# Patient Record
Sex: Male | Born: 1935 | Race: White | Hispanic: No | Marital: Married | Smoking: Former smoker
Health system: Southern US, Community
[De-identification: ages and names within clinical notes are randomized; demographics above are authoritative.]

## PROBLEM LIST (undated history)

## (undated) DIAGNOSIS — I4891 Unspecified atrial fibrillation: Secondary | ICD-10-CM

## (undated) DIAGNOSIS — I499 Cardiac arrhythmia, unspecified: Secondary | ICD-10-CM

## (undated) DIAGNOSIS — G309 Alzheimer's disease, unspecified: Secondary | ICD-10-CM

## (undated) DIAGNOSIS — F0280 Dementia in other diseases classified elsewhere without behavioral disturbance: Secondary | ICD-10-CM

## (undated) DIAGNOSIS — I219 Acute myocardial infarction, unspecified: Secondary | ICD-10-CM

## (undated) DIAGNOSIS — I2510 Atherosclerotic heart disease of native coronary artery without angina pectoris: Secondary | ICD-10-CM

## (undated) DIAGNOSIS — I6200 Nontraumatic subdural hemorrhage, unspecified: Secondary | ICD-10-CM

## (undated) DIAGNOSIS — I10 Essential (primary) hypertension: Secondary | ICD-10-CM

## (undated) HISTORY — DX: Cardiac arrhythmia, unspecified: I49.9

## (undated) HISTORY — DX: Atherosclerotic heart disease of native coronary artery without angina pectoris: I25.10

## (undated) HISTORY — DX: Acute myocardial infarction, unspecified: I21.9

## (undated) HISTORY — PX: CORONARY ANGIOPLASTY: SHX604

## (undated) HISTORY — DX: Essential (primary) hypertension: I10

---

## 1991-12-12 DIAGNOSIS — I219 Acute myocardial infarction, unspecified: Secondary | ICD-10-CM

## 1991-12-12 HISTORY — DX: Acute myocardial infarction, unspecified: I21.9

## 2000-05-08 ENCOUNTER — Encounter: Admission: RE | Admit: 2000-05-08 | Discharge: 2000-05-08 | Payer: Self-pay | Admitting: Internal Medicine

## 2000-05-08 ENCOUNTER — Encounter: Payer: Self-pay | Admitting: Internal Medicine

## 2002-12-17 ENCOUNTER — Ambulatory Visit (HOSPITAL_COMMUNITY): Admission: RE | Admit: 2002-12-17 | Discharge: 2002-12-17 | Payer: Self-pay | Admitting: Gastroenterology

## 2005-09-30 ENCOUNTER — Emergency Department (HOSPITAL_COMMUNITY): Admission: EM | Admit: 2005-09-30 | Discharge: 2005-09-30 | Payer: Self-pay | Admitting: Emergency Medicine

## 2006-01-22 ENCOUNTER — Emergency Department (HOSPITAL_COMMUNITY): Admission: EM | Admit: 2006-01-22 | Discharge: 2006-01-22 | Payer: Self-pay | Admitting: Emergency Medicine

## 2007-12-12 DIAGNOSIS — I499 Cardiac arrhythmia, unspecified: Secondary | ICD-10-CM

## 2007-12-12 HISTORY — DX: Cardiac arrhythmia, unspecified: I49.9

## 2008-08-28 DIAGNOSIS — I10 Essential (primary) hypertension: Secondary | ICD-10-CM

## 2008-08-28 HISTORY — DX: Essential (primary) hypertension: I10

## 2011-12-21 DIAGNOSIS — Z961 Presence of intraocular lens: Secondary | ICD-10-CM | POA: Diagnosis not present

## 2011-12-21 DIAGNOSIS — H0230 Blepharochalasis unspecified eye, unspecified eyelid: Secondary | ICD-10-CM | POA: Diagnosis not present

## 2011-12-21 DIAGNOSIS — H26499 Other secondary cataract, unspecified eye: Secondary | ICD-10-CM | POA: Diagnosis not present

## 2012-01-12 DIAGNOSIS — I2510 Atherosclerotic heart disease of native coronary artery without angina pectoris: Secondary | ICD-10-CM | POA: Diagnosis not present

## 2012-01-12 DIAGNOSIS — E782 Mixed hyperlipidemia: Secondary | ICD-10-CM | POA: Diagnosis not present

## 2012-02-09 DIAGNOSIS — C787 Secondary malignant neoplasm of liver and intrahepatic bile duct: Secondary | ICD-10-CM | POA: Diagnosis not present

## 2012-02-09 DIAGNOSIS — C259 Malignant neoplasm of pancreas, unspecified: Secondary | ICD-10-CM | POA: Diagnosis not present

## 2012-06-04 DIAGNOSIS — E785 Hyperlipidemia, unspecified: Secondary | ICD-10-CM | POA: Diagnosis not present

## 2012-06-04 DIAGNOSIS — N400 Enlarged prostate without lower urinary tract symptoms: Secondary | ICD-10-CM | POA: Diagnosis not present

## 2012-06-04 DIAGNOSIS — E871 Hypo-osmolality and hyponatremia: Secondary | ICD-10-CM | POA: Diagnosis not present

## 2012-06-04 DIAGNOSIS — I10 Essential (primary) hypertension: Secondary | ICD-10-CM | POA: Diagnosis not present

## 2012-06-10 DIAGNOSIS — I10 Essential (primary) hypertension: Secondary | ICD-10-CM | POA: Diagnosis not present

## 2012-06-10 DIAGNOSIS — E871 Hypo-osmolality and hyponatremia: Secondary | ICD-10-CM | POA: Diagnosis not present

## 2012-06-10 DIAGNOSIS — N400 Enlarged prostate without lower urinary tract symptoms: Secondary | ICD-10-CM | POA: Diagnosis not present

## 2012-06-10 DIAGNOSIS — E785 Hyperlipidemia, unspecified: Secondary | ICD-10-CM | POA: Diagnosis not present

## 2012-08-21 DIAGNOSIS — L821 Other seborrheic keratosis: Secondary | ICD-10-CM | POA: Diagnosis not present

## 2012-08-21 DIAGNOSIS — L820 Inflamed seborrheic keratosis: Secondary | ICD-10-CM | POA: Diagnosis not present

## 2012-08-21 DIAGNOSIS — D485 Neoplasm of uncertain behavior of skin: Secondary | ICD-10-CM | POA: Diagnosis not present

## 2012-08-21 DIAGNOSIS — Z23 Encounter for immunization: Secondary | ICD-10-CM | POA: Diagnosis not present

## 2012-08-21 DIAGNOSIS — L819 Disorder of pigmentation, unspecified: Secondary | ICD-10-CM | POA: Diagnosis not present

## 2012-08-22 DIAGNOSIS — I10 Essential (primary) hypertension: Secondary | ICD-10-CM | POA: Diagnosis not present

## 2012-08-22 DIAGNOSIS — E782 Mixed hyperlipidemia: Secondary | ICD-10-CM | POA: Diagnosis not present

## 2012-08-22 DIAGNOSIS — I2510 Atherosclerotic heart disease of native coronary artery without angina pectoris: Secondary | ICD-10-CM | POA: Diagnosis not present

## 2012-09-06 DIAGNOSIS — I119 Hypertensive heart disease without heart failure: Secondary | ICD-10-CM | POA: Diagnosis not present

## 2012-09-06 DIAGNOSIS — I2510 Atherosclerotic heart disease of native coronary artery without angina pectoris: Secondary | ICD-10-CM | POA: Diagnosis not present

## 2012-09-06 DIAGNOSIS — E782 Mixed hyperlipidemia: Secondary | ICD-10-CM | POA: Diagnosis not present

## 2012-09-11 DIAGNOSIS — R5383 Other fatigue: Secondary | ICD-10-CM | POA: Diagnosis not present

## 2012-09-11 DIAGNOSIS — R5381 Other malaise: Secondary | ICD-10-CM | POA: Diagnosis not present

## 2012-09-11 DIAGNOSIS — Z79899 Other long term (current) drug therapy: Secondary | ICD-10-CM | POA: Diagnosis not present

## 2012-09-11 DIAGNOSIS — E782 Mixed hyperlipidemia: Secondary | ICD-10-CM | POA: Diagnosis not present

## 2013-01-31 ENCOUNTER — Encounter: Payer: Self-pay | Admitting: *Deleted

## 2013-02-13 DIAGNOSIS — Z79899 Other long term (current) drug therapy: Secondary | ICD-10-CM | POA: Diagnosis not present

## 2013-02-13 DIAGNOSIS — E782 Mixed hyperlipidemia: Secondary | ICD-10-CM | POA: Diagnosis not present

## 2013-02-13 DIAGNOSIS — I2510 Atherosclerotic heart disease of native coronary artery without angina pectoris: Secondary | ICD-10-CM | POA: Diagnosis not present

## 2013-02-19 DIAGNOSIS — I119 Hypertensive heart disease without heart failure: Secondary | ICD-10-CM | POA: Diagnosis not present

## 2013-02-19 DIAGNOSIS — I2510 Atherosclerotic heart disease of native coronary artery without angina pectoris: Secondary | ICD-10-CM | POA: Diagnosis not present

## 2013-02-19 DIAGNOSIS — E782 Mixed hyperlipidemia: Secondary | ICD-10-CM | POA: Diagnosis not present

## 2013-05-19 ENCOUNTER — Other Ambulatory Visit: Payer: Self-pay | Admitting: Cardiovascular Disease

## 2013-06-09 DIAGNOSIS — Z0000 Encounter for general adult medical examination without abnormal findings: Secondary | ICD-10-CM | POA: Diagnosis not present

## 2013-06-09 DIAGNOSIS — I10 Essential (primary) hypertension: Secondary | ICD-10-CM | POA: Diagnosis not present

## 2013-06-09 DIAGNOSIS — N400 Enlarged prostate without lower urinary tract symptoms: Secondary | ICD-10-CM | POA: Diagnosis not present

## 2013-06-09 DIAGNOSIS — E785 Hyperlipidemia, unspecified: Secondary | ICD-10-CM | POA: Diagnosis not present

## 2013-06-09 DIAGNOSIS — Z1331 Encounter for screening for depression: Secondary | ICD-10-CM | POA: Diagnosis not present

## 2013-08-06 DIAGNOSIS — L259 Unspecified contact dermatitis, unspecified cause: Secondary | ICD-10-CM | POA: Diagnosis not present

## 2013-08-06 DIAGNOSIS — L821 Other seborrheic keratosis: Secondary | ICD-10-CM | POA: Diagnosis not present

## 2013-08-19 ENCOUNTER — Other Ambulatory Visit: Payer: Self-pay | Admitting: Cardiovascular Disease

## 2013-08-20 ENCOUNTER — Telehealth: Payer: Self-pay | Admitting: Cardiovascular Disease

## 2013-08-20 NOTE — Telephone Encounter (Signed)
Rx was sent to pharmacy electronically. 

## 2013-08-20 NOTE — Telephone Encounter (Signed)
Rx has already been sent in to [facility].

## 2013-08-20 NOTE — Telephone Encounter (Signed)
PT needs Rx to be called into [facility] [phone] for Ramipril 10 mg 90 day supply

## 2013-09-12 DIAGNOSIS — Z23 Encounter for immunization: Secondary | ICD-10-CM | POA: Diagnosis not present

## 2013-11-14 ENCOUNTER — Telehealth: Payer: Self-pay | Admitting: Cardiovascular Disease

## 2013-11-14 MED ORDER — AMLODIPINE-ATORVASTATIN 5-10 MG PO TABS: 1.0000 | ORAL_TABLET | Freq: Every day | ORAL | Status: DC

## 2013-11-14 MED ORDER — DOXAZOSIN MESYLATE 2 MG PO TABS: ORAL_TABLET | ORAL | Status: DC

## 2013-11-14 MED ORDER — DOXAZOSIN MESYLATE 1 MG PO TABS: ORAL_TABLET | ORAL | Status: DC

## 2013-11-14 MED ORDER — ISOSORBIDE MONONITRATE ER 60 MG PO TB24: 60.0000 mg | ORAL_TABLET | Freq: Every day | ORAL | Status: DC

## 2013-11-14 NOTE — Telephone Encounter (Signed)
Transferring medicine to a new pharmacy.Says he will need new prescriptions. The medicine are Isosorbide Mono ER 60 mg #90 ,Soxazosin 1mg  and 2 mg #90 and Amlodatorva 5/10 mg #90 with a year refill. Please call this into [facility]-[phone].

## 2013-11-14 NOTE — Telephone Encounter (Signed)
Rx(s) was sent to pharmacy electronically.  

## 2013-12-18 DIAGNOSIS — M79609 Pain in unspecified limb: Secondary | ICD-10-CM | POA: Diagnosis not present

## 2013-12-18 DIAGNOSIS — M25579 Pain in unspecified ankle and joints of unspecified foot: Secondary | ICD-10-CM | POA: Diagnosis not present

## 2013-12-19 ENCOUNTER — Ambulatory Visit
Admission: RE | Admit: 2013-12-19 | Discharge: 2013-12-19 | Disposition: A | Discharge status: Home / Self Care | Payer: Medicare Other | Source: Ambulatory Visit | Attending: Internal Medicine | Admitting: Internal Medicine

## 2013-12-19 ENCOUNTER — Other Ambulatory Visit: Payer: Self-pay | Admitting: Internal Medicine

## 2013-12-19 DIAGNOSIS — M25571 Pain in right ankle and joints of right foot: Secondary | ICD-10-CM

## 2013-12-19 DIAGNOSIS — M25579 Pain in unspecified ankle and joints of unspecified foot: Secondary | ICD-10-CM | POA: Diagnosis not present

## 2013-12-19 DIAGNOSIS — M79671 Pain in right foot: Secondary | ICD-10-CM

## 2013-12-19 DIAGNOSIS — M19079 Primary osteoarthritis, unspecified ankle and foot: Secondary | ICD-10-CM | POA: Diagnosis not present

## 2014-01-14 ENCOUNTER — Other Ambulatory Visit: Payer: Self-pay | Admitting: Cardiovascular Disease

## 2014-01-15 NOTE — Telephone Encounter (Signed)
Rx was sent to pharmacy electronically. 

## 2014-01-21 ENCOUNTER — Encounter (HOSPITAL_COMMUNITY): Payer: Self-pay | Admitting: Emergency Medicine

## 2014-01-21 ENCOUNTER — Emergency Department (HOSPITAL_COMMUNITY): Payer: Worker's Compensation

## 2014-01-21 ENCOUNTER — Inpatient Hospital Stay (HOSPITAL_COMMUNITY)
Admission: EM | Admit: 2014-01-21 | Discharge: 2014-01-24 | DRG: 087 | Disposition: A | Discharge status: Home / Self Care | Payer: Worker's Compensation | Attending: Neurological Surgery | Admitting: Neurological Surgery

## 2014-01-21 DIAGNOSIS — S0190XA Unspecified open wound of unspecified part of head, initial encounter: Secondary | ICD-10-CM | POA: Diagnosis not present

## 2014-01-21 DIAGNOSIS — S065XAA Traumatic subdural hemorrhage with loss of consciousness status unknown, initial encounter: Principal | ICD-10-CM | POA: Diagnosis present

## 2014-01-21 DIAGNOSIS — S0100XA Unspecified open wound of scalp, initial encounter: Secondary | ICD-10-CM | POA: Diagnosis present

## 2014-01-21 DIAGNOSIS — Z79899 Other long term (current) drug therapy: Secondary | ICD-10-CM

## 2014-01-21 DIAGNOSIS — S0280XA Fracture of other specified skull and facial bones, unspecified side, initial encounter for closed fracture: Secondary | ICD-10-CM | POA: Diagnosis not present

## 2014-01-21 DIAGNOSIS — S066X9A Traumatic subarachnoid hemorrhage with loss of consciousness of unspecified duration, initial encounter: Principal | ICD-10-CM

## 2014-01-21 DIAGNOSIS — X58XXXA Exposure to other specified factors, initial encounter: Secondary | ICD-10-CM | POA: Diagnosis present

## 2014-01-21 DIAGNOSIS — S0993XA Unspecified injury of face, initial encounter: Secondary | ICD-10-CM | POA: Diagnosis not present

## 2014-01-21 DIAGNOSIS — I10 Essential (primary) hypertension: Secondary | ICD-10-CM | POA: Diagnosis present

## 2014-01-21 DIAGNOSIS — S065X9A Traumatic subdural hemorrhage with loss of consciousness of unspecified duration, initial encounter: Principal | ICD-10-CM

## 2014-01-21 DIAGNOSIS — S064X9A Epidural hemorrhage with loss of consciousness of unspecified duration, initial encounter: Principal | ICD-10-CM

## 2014-01-21 DIAGNOSIS — S0990XA Unspecified injury of head, initial encounter: Secondary | ICD-10-CM | POA: Diagnosis present

## 2014-01-21 DIAGNOSIS — S066XAA Traumatic subarachnoid hemorrhage with loss of consciousness status unknown, initial encounter: Secondary | ICD-10-CM | POA: Diagnosis not present

## 2014-01-21 DIAGNOSIS — Z7982 Long term (current) use of aspirin: Secondary | ICD-10-CM

## 2014-01-21 DIAGNOSIS — S298XXA Other specified injuries of thorax, initial encounter: Secondary | ICD-10-CM | POA: Diagnosis not present

## 2014-01-21 DIAGNOSIS — I252 Old myocardial infarction: Secondary | ICD-10-CM

## 2014-01-21 DIAGNOSIS — I2510 Atherosclerotic heart disease of native coronary artery without angina pectoris: Secondary | ICD-10-CM | POA: Diagnosis present

## 2014-01-21 DIAGNOSIS — Z9861 Coronary angioplasty status: Secondary | ICD-10-CM

## 2014-01-21 LAB — COMPREHENSIVE METABOLIC PANEL
ALK PHOS: 53 U/L (ref 39–117)
ALT: 19 U/L (ref 0–53)
AST: 24 U/L (ref 0–37)
Albumin: 4 g/dL (ref 3.5–5.2)
BILIRUBIN TOTAL: 0.6 mg/dL (ref 0.3–1.2)
BUN: 13 mg/dL (ref 6–23)
CHLORIDE: 94 meq/L — AB (ref 96–112)
CO2: 25 mEq/L (ref 19–32)
Calcium: 9.1 mg/dL (ref 8.4–10.5)
Creatinine, Ser: 0.8 mg/dL (ref 0.50–1.35)
GFR, EST NON AFRICAN AMERICAN: 85 mL/min — AB (ref >90)
GLUCOSE: 110 mg/dL — AB (ref 70–99)
POTASSIUM: 3.9 meq/L (ref 3.7–5.3)
Sodium: 131 mEq/L — ABNORMAL LOW (ref 137–147)
Total Protein: 6.6 g/dL (ref 6.0–8.3)

## 2014-01-21 LAB — POCT I-STAT 3, VENOUS BLOOD GAS (G3P V)
ACID-BASE EXCESS: 1 mmol/L (ref 0.0–2.0)
Bicarbonate: 28.1 mEq/L — ABNORMAL HIGH (ref 20.0–24.0)
O2 SAT: 82 %
TCO2: 30 mmol/L (ref 0–100)
pCO2, Ven: 52.4 mmHg — ABNORMAL HIGH (ref 45.0–50.0)
pH, Ven: 7.337 — ABNORMAL HIGH (ref 7.250–7.300)
pO2, Ven: 50 mmHg — ABNORMAL HIGH (ref 30.0–45.0)

## 2014-01-21 LAB — CBC WITH DIFFERENTIAL/PLATELET
Basophils Absolute: 0 10*3/uL (ref 0.0–0.1)
Basophils Relative: 0 % (ref 0–1)
Eosinophils Absolute: 0.1 10*3/uL (ref 0.0–0.7)
Eosinophils Relative: 2 % (ref 0–5)
HEMATOCRIT: 37.1 % — AB (ref 39.0–52.0)
HEMOGLOBIN: 13.2 g/dL (ref 13.0–17.0)
LYMPHS ABS: 1.6 10*3/uL (ref 0.7–4.0)
Lymphocytes Relative: 29 % (ref 12–46)
MCH: 31.5 pg (ref 26.0–34.0)
MCHC: 35.6 g/dL (ref 30.0–36.0)
MCV: 88.5 fL (ref 78.0–100.0)
MONOS PCT: 12 % (ref 3–12)
Monocytes Absolute: 0.7 10*3/uL (ref 0.1–1.0)
NEUTROS ABS: 3.2 10*3/uL (ref 1.7–7.7)
Neutrophils Relative %: 57 % (ref 43–77)
Platelets: 191 10*3/uL (ref 150–400)
RBC: 4.19 MIL/uL — ABNORMAL LOW (ref 4.22–5.81)
RDW: 12.6 % (ref 11.5–15.5)
WBC: 5.7 10*3/uL (ref 4.0–10.5)

## 2014-01-21 LAB — POCT I-STAT, CHEM 8
BUN: 12 mg/dL (ref 6–23)
CALCIUM ION: 1.26 mmol/L (ref 1.13–1.30)
Chloride: 94 mEq/L — ABNORMAL LOW (ref 96–112)
Creatinine, Ser: 0.9 mg/dL (ref 0.50–1.35)
Glucose, Bld: 109 mg/dL — ABNORMAL HIGH (ref 70–99)
HEMATOCRIT: 42 % (ref 39.0–52.0)
HEMOGLOBIN: 14.3 g/dL (ref 13.0–17.0)
Potassium: 3.9 mEq/L (ref 3.7–5.3)
SODIUM: 133 meq/L — AB (ref 137–147)
TCO2: 27 mmol/L (ref 0–100)

## 2014-01-21 LAB — URINALYSIS, ROUTINE W REFLEX MICROSCOPIC
BILIRUBIN URINE: NEGATIVE
Glucose, UA: NEGATIVE mg/dL
HGB URINE DIPSTICK: NEGATIVE
Ketones, ur: NEGATIVE mg/dL
Leukocytes, UA: NEGATIVE
Nitrite: NEGATIVE
PROTEIN: NEGATIVE mg/dL
Specific Gravity, Urine: 1.01 (ref 1.005–1.030)
Urobilinogen, UA: 0.2 mg/dL (ref 0.0–1.0)
pH: 7.5 (ref 5.0–8.0)

## 2014-01-21 LAB — CG4 I-STAT (LACTIC ACID): LACTIC ACID, VENOUS: 0.88 mmol/L (ref 0.5–2.2)

## 2014-01-21 LAB — PROTIME-INR
INR: 1.07 (ref 0.00–1.49)
PROTHROMBIN TIME: 13.7 s (ref 11.6–15.2)

## 2014-01-21 LAB — POCT I-STAT TROPONIN I: TROPONIN I, POC: 0.01 ng/mL (ref 0.00–0.08)

## 2014-01-21 LAB — ETHANOL

## 2014-01-21 LAB — AMMONIA: AMMONIA: 18 umol/L (ref 11–60)

## 2014-01-21 MED ORDER — ACETAMINOPHEN 325 MG PO TABS: 650.0000 mg | ORAL_TABLET | ORAL | Status: DC | PRN

## 2014-01-21 MED ORDER — SODIUM CHLORIDE 0.9 % IV SOLN
INTRAVENOUS | Status: DC
  Administered 2014-01-21: via INTRAVENOUS
  Administered 2014-01-22: 1000 mL via INTRAVENOUS
  Administered 2014-01-23: 06:00:00 via INTRAVENOUS

## 2014-01-21 MED ORDER — ACETAMINOPHEN 650 MG RE SUPP: 650.0000 mg | RECTAL | Status: DC | PRN

## 2014-01-21 MED ORDER — TETANUS-DIPHTH-ACELL PERTUSSIS 5-2.5-18.5 LF-MCG/0.5 IM SUSP
0.5000 mL | Freq: Once | INTRAMUSCULAR | Status: AC
  Administered 2014-01-21: 0.5 mL via INTRAMUSCULAR
  Filled 2014-01-21: qty 0.5

## 2014-01-21 MED ORDER — MORPHINE SULFATE 2 MG/ML IJ SOLN: 1.0000 mg | INTRAMUSCULAR | Status: DC | PRN

## 2014-01-21 MED ORDER — DEXTROSE 5 % IV SOLN
1.0000 g | Freq: Once | INTRAVENOUS | Status: AC
  Administered 2014-01-21: 1 g via INTRAVENOUS
  Filled 2014-01-21: qty 10

## 2014-01-21 MED ORDER — SENNOSIDES-DOCUSATE SODIUM 8.6-50 MG PO TABS
1.0000 | ORAL_TABLET | Freq: Two times a day (BID) | ORAL | Status: DC
  Administered 2014-01-22 – 2014-01-24 (×5): 1 via ORAL
  Filled 2014-01-21 (×5): qty 1

## 2014-01-21 MED ORDER — TRAMADOL HCL 50 MG PO TABS
50.0000 mg | ORAL_TABLET | Freq: Four times a day (QID) | ORAL | Status: DC | PRN
  Administered 2014-01-24: 50 mg via ORAL
  Filled 2014-01-21: qty 1

## 2014-01-21 NOTE — ED Notes (Signed)
Pt denies remembering anything about what happened today.
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
## 2014-01-21 NOTE — ED Notes (Signed)
Attempted to give reportx1 

## 2014-01-21 NOTE — Progress Notes (Signed)
Pt had one episode of green emesis. Pt still c/o nausea. Dr. Ronnald Ramp paged. Will continue to monitor. Verdie Drown RN, BSN
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
## 2014-01-21 NOTE — ED Notes (Signed)
Patient transported to CT 

## 2014-01-21 NOTE — ED Notes (Signed)
PT returned from CT. Hooked back up to monitor.

## 2014-01-21 NOTE — ED Notes (Signed)
NOTIFIED DR. RANCOUR IN PERSON OF PATIENTS LAB RESULTS ,01/21/2014.
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
## 2014-01-21 NOTE — ED Notes (Signed)
Pt attempted to use urinal, unable to urinate at this time.
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
## 2014-01-21 NOTE — ED Notes (Signed)
ED resident at bedside speaking to pt's wife and friend. 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

## 2014-01-22 ENCOUNTER — Observation Stay (HOSPITAL_COMMUNITY): Payer: Worker's Compensation

## 2014-01-22 ENCOUNTER — Telehealth: Payer: Self-pay | Admitting: Cardiovascular Disease

## 2014-01-22 DIAGNOSIS — S065XAA Traumatic subdural hemorrhage with loss of consciousness status unknown, initial encounter: Secondary | ICD-10-CM | POA: Diagnosis not present

## 2014-01-22 DIAGNOSIS — S0280XA Fracture of other specified skull and facial bones, unspecified side, initial encounter for closed fracture: Secondary | ICD-10-CM | POA: Diagnosis not present

## 2014-01-22 DIAGNOSIS — S066XAA Traumatic subarachnoid hemorrhage with loss of consciousness status unknown, initial encounter: Secondary | ICD-10-CM | POA: Diagnosis not present

## 2014-01-22 MED ORDER — RAMIPRIL 10 MG PO CAPS
10.0000 mg | ORAL_CAPSULE | Freq: Every day | ORAL | Status: DC
  Administered 2014-01-22 – 2014-01-24 (×3): 10 mg via ORAL
  Filled 2014-01-22 (×3): qty 1

## 2014-01-22 MED ORDER — ATORVASTATIN CALCIUM 10 MG PO TABS
10.0000 mg | ORAL_TABLET | Freq: Every day | ORAL | Status: DC
  Filled 2014-01-22 (×3): qty 1

## 2014-01-22 MED ORDER — DOXAZOSIN MESYLATE 1 MG PO TABS
1.0000 mg | ORAL_TABLET | Freq: Every day | ORAL | Status: DC
  Administered 2014-01-22 – 2014-01-23 (×2): 1 mg via ORAL
  Filled 2014-01-22 (×4): qty 1

## 2014-01-22 MED ORDER — AMLODIPINE-ATORVASTATIN 5-10 MG PO TABS: 1.0000 | ORAL_TABLET | Freq: Every day | ORAL | Status: DC

## 2014-01-22 MED ORDER — AMLODIPINE BESYLATE 5 MG PO TABS
5.0000 mg | ORAL_TABLET | Freq: Every day | ORAL | Status: DC
  Administered 2014-01-22 – 2014-01-24 (×3): 5 mg via ORAL
  Filled 2014-01-22 (×4): qty 1

## 2014-01-22 MED ORDER — DOXAZOSIN MESYLATE 2 MG PO TABS
2.0000 mg | ORAL_TABLET | Freq: Every day | ORAL | Status: DC
  Administered 2014-01-22 – 2014-01-24 (×3): 2 mg via ORAL
  Filled 2014-01-22 (×3): qty 1

## 2014-01-22 MED ORDER — METOPROLOL SUCCINATE ER 100 MG PO TB24
125.0000 mg | ORAL_TABLET | Freq: Every day | ORAL | Status: DC
  Administered 2014-01-22 – 2014-01-24 (×3): 125 mg via ORAL
  Filled 2014-01-22 (×3): qty 1

## 2014-01-22 MED ORDER — ISOSORBIDE MONONITRATE ER 60 MG PO TB24
60.0000 mg | ORAL_TABLET | Freq: Every day | ORAL | Status: DC
  Administered 2014-01-22 – 2014-01-24 (×3): 60 mg via ORAL
  Filled 2014-01-22 (×3): qty 1
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
## 2014-01-22 NOTE — Progress Notes (Signed)
Joseph Winters, patient wife wanted to speak to Dr. Ronnald Ramp regarding status of per husband.  This RN explained plan to her and what Dr. Ronnald Ramp' note from today said.  She would still like to speak to the MD.  I called the neuro OR and left a message for Dr. Ronnald Ramp to call the wife.  The RN answering the phone took Joseph Winters's cell phone number and let me know that he was in surgery but that she would pass on the message to have him call her.  Joseph Bane, RN
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
## 2014-01-22 NOTE — Progress Notes (Signed)
OT Cancellation Note  Patient Details Name: Joseph Winters MRN: [id number] DOB: 1935/03/21   Cancelled Treatment:    Reason Eval/Treat Not Completed: Patient not medically ready - pt currently on bedrest.  Will initiate OT eval once activity increased.  Lucille Passy, OTR/L [phone]  01/22/2014, 9:46 AM

## 2014-01-22 NOTE — Progress Notes (Signed)
PT Cancellation Note  Patient Details Name: Joseph Winters MRN: [id number] DOB: 03-May-1935   Cancelled Treatment:    Reason Eval/Treat Not Completed: Patient not medically ready.  Pt continues to be on bedrest.  Please advance activity if pt appropriate for mobility.  Thanks.     Hadriel Northup, Thornton Papas 01/22/2014, 9:43 AM
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
## 2014-01-23 DIAGNOSIS — S066XAA Traumatic subarachnoid hemorrhage with loss of consciousness status unknown, initial encounter: Secondary | ICD-10-CM | POA: Diagnosis not present

## 2014-01-23 DIAGNOSIS — S064XAA Epidural hemorrhage with loss of consciousness status unknown, initial encounter: Secondary | ICD-10-CM | POA: Diagnosis not present

## 2014-01-23 DIAGNOSIS — S0280XA Fracture of other specified skull and facial bones, unspecified side, initial encounter for closed fracture: Secondary | ICD-10-CM | POA: Diagnosis not present
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
## 2014-01-24 NOTE — Progress Notes (Signed)
Discharge orders received, pt for discharge home today with outpatient OT, Rx for outpatient OT signed by MD and given to pt and wife.  IV D/C with staples OTA on right side of pt head.  D/C instructions and Rx given with verbalized understanding.  Family at bedside to assist pt with discharge. Staff brought pt downstairs via wheelchair.

## 2014-01-26 LAB — DRUG SCREEN PANEL (SERUM)

## 2014-02-16 ENCOUNTER — Other Ambulatory Visit: Payer: Self-pay | Admitting: Neurological Surgery

## 2014-02-16 DIAGNOSIS — S0990XA Unspecified injury of head, initial encounter: Secondary | ICD-10-CM

## 2014-02-18 ENCOUNTER — Encounter: Payer: Self-pay | Admitting: Cardiovascular Disease

## 2014-02-18 ENCOUNTER — Ambulatory Visit (INDEPENDENT_AMBULATORY_CARE_PROVIDER_SITE_OTHER): Payer: Medicare Other | Admitting: Cardiovascular Disease

## 2014-02-18 VITALS — BP 118/70 | HR 52 | Ht 66.0 in | Wt 177.0 lb

## 2014-02-18 DIAGNOSIS — I2510 Atherosclerotic heart disease of native coronary artery without angina pectoris: Secondary | ICD-10-CM | POA: Diagnosis not present

## 2014-02-18 DIAGNOSIS — I10 Essential (primary) hypertension: Secondary | ICD-10-CM | POA: Diagnosis not present

## 2014-02-18 DIAGNOSIS — I252 Old myocardial infarction: Secondary | ICD-10-CM | POA: Diagnosis not present

## 2014-02-18 DIAGNOSIS — E785 Hyperlipidemia, unspecified: Secondary | ICD-10-CM

## 2014-02-18 NOTE — Patient Instructions (Addendum)
Your physician has requested that you have an echocardiogram. Echocardiography is a painless test that uses sound waves to create images of your heart. It provides your doctor with information about the size and shape of your heart and how well your heart's chambers and valves are working. This procedure takes approximately one hour. There are no restrictions for this procedure.  Your physician has recommended you make the following change in your medication: stop the cardura 1 mg. Take the cardura 2mg  at night.   Your physician has requested that you have a lexiscan myoview. For further information please visit [email or web address]. Please follow instruction sheet, as given.   Your physician recommends that you schedule a follow-up appointment in: 6 weeks.

## 2014-02-19 ENCOUNTER — Ambulatory Visit
Admission: RE | Admit: 2014-02-19 | Discharge: 2014-02-19 | Disposition: A | Discharge status: Home / Self Care | Payer: Worker's Compensation | Source: Ambulatory Visit | Attending: Neurological Surgery | Admitting: Neurological Surgery

## 2014-02-19 DIAGNOSIS — S0990XA Unspecified injury of head, initial encounter: Secondary | ICD-10-CM

## 2014-02-20 ENCOUNTER — Telehealth: Payer: Self-pay | Admitting: Cardiovascular Disease

## 2014-02-20 NOTE — Telephone Encounter (Signed)
Pt saw Dr Claiborne Billings last week and he forgot to get all his medicine authorized for another year. Would you please do this?

## 2014-02-20 NOTE — Telephone Encounter (Signed)
Message forwarded to Wanda, CMA.  

## 2014-02-24 ENCOUNTER — Other Ambulatory Visit: Payer: Self-pay | Admitting: *Deleted

## 2014-02-24 MED ORDER — ISOSORBIDE MONONITRATE ER 60 MG PO TB24: 60.0000 mg | ORAL_TABLET | Freq: Every day | ORAL | Status: DC

## 2014-02-24 MED ORDER — AMLODIPINE-ATORVASTATIN 5-10 MG PO TABS: 1.0000 | ORAL_TABLET | Freq: Every day | ORAL | Status: DC

## 2014-02-24 MED ORDER — RAMIPRIL 10 MG PO CAPS: 10.0000 mg | ORAL_CAPSULE | Freq: Every day | ORAL | Status: DC

## 2014-02-24 NOTE — Telephone Encounter (Signed)
Refilled caduet, amlodipine and metoprolol to patient's mail order pharmacy.

## 2014-02-25 ENCOUNTER — Ambulatory Visit: Payer: Worker's Compensation | Attending: Neurological Surgery | Admitting: Physical Therapy

## 2014-02-25 DIAGNOSIS — Z5189 Encounter for other specified aftercare: Secondary | ICD-10-CM | POA: Insufficient documentation

## 2014-02-25 DIAGNOSIS — R5381 Other malaise: Secondary | ICD-10-CM | POA: Insufficient documentation

## 2014-02-25 DIAGNOSIS — R269 Unspecified abnormalities of gait and mobility: Secondary | ICD-10-CM | POA: Insufficient documentation

## 2014-02-25 DIAGNOSIS — H8110 Benign paroxysmal vertigo, unspecified ear: Secondary | ICD-10-CM | POA: Insufficient documentation

## 2014-02-26 ENCOUNTER — Other Ambulatory Visit: Payer: Self-pay | Admitting: Neurological Surgery

## 2014-02-26 DIAGNOSIS — S0990XA Unspecified injury of head, initial encounter: Secondary | ICD-10-CM

## 2014-02-27 ENCOUNTER — Telehealth (HOSPITAL_COMMUNITY): Payer: Self-pay

## 2014-03-01 ENCOUNTER — Encounter: Payer: Self-pay | Admitting: Cardiovascular Disease

## 2014-03-01 DIAGNOSIS — I2510 Atherosclerotic heart disease of native coronary artery without angina pectoris: Secondary | ICD-10-CM | POA: Insufficient documentation

## 2014-03-01 DIAGNOSIS — E785 Hyperlipidemia, unspecified: Secondary | ICD-10-CM | POA: Insufficient documentation

## 2014-03-01 DIAGNOSIS — I10 Essential (primary) hypertension: Secondary | ICD-10-CM | POA: Insufficient documentation

## 2014-03-01 DIAGNOSIS — I252 Old myocardial infarction: Secondary | ICD-10-CM | POA: Insufficient documentation
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
## 2014-03-02 ENCOUNTER — Ambulatory Visit: Payer: Worker's Compensation | Attending: Neurological Surgery | Admitting: Physical Therapy

## 2014-03-02 ENCOUNTER — Ambulatory Visit: Payer: Worker's Compensation | Admitting: *Deleted

## 2014-03-02 ENCOUNTER — Telehealth: Payer: Self-pay | Admitting: Cardiovascular Disease

## 2014-03-02 DIAGNOSIS — R269 Unspecified abnormalities of gait and mobility: Secondary | ICD-10-CM | POA: Diagnosis not present

## 2014-03-02 DIAGNOSIS — Z5189 Encounter for other specified aftercare: Secondary | ICD-10-CM | POA: Diagnosis present

## 2014-03-02 DIAGNOSIS — R4189 Other symptoms and signs involving cognitive functions and awareness: Secondary | ICD-10-CM | POA: Diagnosis not present

## 2014-03-02 DIAGNOSIS — R5381 Other malaise: Secondary | ICD-10-CM | POA: Diagnosis not present

## 2014-03-02 DIAGNOSIS — H8110 Benign paroxysmal vertigo, unspecified ear: Secondary | ICD-10-CM | POA: Insufficient documentation

## 2014-03-02 NOTE — Telephone Encounter (Signed)
Returned call and spoke w/ wife.  Stated she talked w/ Dr. Ronnald Ramp' office (neurology) and he said they shouldn't be concerned so they plan to keep appt in the morning.

## 2014-03-02 NOTE — Telephone Encounter (Signed)
Is scheduled for a stress test on tomorrow and have an important question to ask before the test . Please Call at [phone] or [phone]   Thanks
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
## 2014-03-02 NOTE — Telephone Encounter (Signed)
Spoke w/ Dr. Claiborne Billings.  Advised pt's Carlton Adam is not urgent and can be postponed until pt seen by neurology, but pt needs to have completed.  Will inform pt/wife when call returned.

## 2014-03-02 NOTE — Telephone Encounter (Signed)
Returned call.  Left message to call back before 4:30 pm. (both numbers)

## 2014-03-03 ENCOUNTER — Ambulatory Visit (HOSPITAL_BASED_OUTPATIENT_CLINIC_OR_DEPARTMENT_OTHER)
Admission: RE | Admit: 2014-03-03 | Discharge: 2014-03-03 | Disposition: A | Discharge status: Home / Self Care | Payer: Medicare Other | Source: Ambulatory Visit | Attending: Cardiovascular Disease | Admitting: Cardiovascular Disease

## 2014-03-03 ENCOUNTER — Ambulatory Visit (INDEPENDENT_AMBULATORY_CARE_PROVIDER_SITE_OTHER): Payer: Medicare Other | Admitting: Cardiology

## 2014-03-03 ENCOUNTER — Ambulatory Visit (HOSPITAL_COMMUNITY)
Admission: RE | Admit: 2014-03-03 | Discharge: 2014-03-03 | Disposition: A | Discharge status: Home / Self Care | Payer: Medicare Other | Source: Ambulatory Visit | Attending: Cardiovascular Disease | Admitting: Cardiovascular Disease

## 2014-03-03 ENCOUNTER — Encounter: Payer: Self-pay | Admitting: Cardiology

## 2014-03-03 VITALS — BP 110/60 | HR 64 | Ht 66.0 in | Wt 179.0 lb

## 2014-03-03 DIAGNOSIS — I2510 Atherosclerotic heart disease of native coronary artery without angina pectoris: Secondary | ICD-10-CM

## 2014-03-03 DIAGNOSIS — I4891 Unspecified atrial fibrillation: Secondary | ICD-10-CM

## 2014-03-03 DIAGNOSIS — I517 Cardiomegaly: Secondary | ICD-10-CM

## 2014-03-03 DIAGNOSIS — I480 Paroxysmal atrial fibrillation: Secondary | ICD-10-CM

## 2014-03-03 DIAGNOSIS — I4892 Unspecified atrial flutter: Secondary | ICD-10-CM | POA: Diagnosis not present

## 2014-03-03 DIAGNOSIS — I252 Old myocardial infarction: Secondary | ICD-10-CM

## 2014-03-03 DIAGNOSIS — S0990XA Unspecified injury of head, initial encounter: Secondary | ICD-10-CM | POA: Diagnosis not present

## 2014-03-03 MED ORDER — TECHNETIUM TC 99M SESTAMIBI GENERIC - CARDIOLITE
10.0000 | Freq: Once | INTRAVENOUS | Status: AC | PRN
  Administered 2014-03-03: 10 via INTRAVENOUS

## 2014-03-03 MED ORDER — TECHNETIUM TC 99M SESTAMIBI GENERIC - CARDIOLITE
30.0000 | Freq: Once | INTRAVENOUS | Status: AC | PRN
  Administered 2014-03-03: 30 via INTRAVENOUS

## 2014-03-03 MED ORDER — REGADENOSON 0.4 MG/5ML IV SOLN
0.4000 mg | Freq: Once | INTRAVENOUS | Status: AC
  Administered 2014-03-03: 0.4 mg via INTRAVENOUS

## 2014-03-03 NOTE — Patient Instructions (Signed)
You are in atrial fib today today and you were in the hospital but not when you saw Dr. Claiborne Billings.    The rate is controlled.  Continue the asprin.   Follow up with Dr. Claiborne Billings as scheduled though I will talk with him this week to see if he has any further plans.
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
## 2014-03-03 NOTE — Procedures (Addendum)
[place] [address] [place] [address] [phone]  Cardiology Nuclear Med Study  Joseph Winters is a 79 y.o. male     MRN : [id number]     DOB: 1935/04/19  Procedure Date: 03/03/2014  Nuclear Med Background Indication for Stress Test:  PTCA Patency History:  CAD;MI--1993 AND 1994;PTCA--1993 AND 1994;AFIB;Last Stress MPI on 08/22/2012--scar;EF=43 % Cardiac Risk Factors: Family History - CAD, History of Smoking, Hypertension, LBBB, Lipids and Overweight  Symptoms:  Pt denies symptoms at this time.   Nuclear Pre-Procedure Caffeine/Decaff Intake:  7:00pm NPO After: 5:00am   IV Site: R Hand  IV 0.9% NS with Angio Cath:  22g  Chest Size (in):  42"  IV Started by: Azucena Cecil, RN  Height: 5\' 6"  (1.676 m)  Cup Size: n/a  BMI:  Body mass index is 28.58 kg/(m^2). Weight:  177 lb (80.287 kg)   Tech Comments:  n/a    Nuclear Med Study 1 or 2 day study: 1 day  Stress Test Type:  [place] Provider:  Shelva Majestic, MD   Resting Radionuclide: Technetium 11m Sestamibi  Resting Radionuclide Dose: 10.4 mCi   Stress Radionuclide:  Technetium 62m Sestamibi  Stress Radionuclide Dose: 30.0 mCi           Stress Protocol Rest HR: 76 Stress HR: 106  Rest BP: 167/98 Stress BP:167/98  Exercise Time (min): n/a METS: n/a          Dose of Adenosine (mg):  n/a Dose of Lexiscan: 0.4 mg  Dose of Atropine (mg): n/a Dose of Dobutamine: n/a mcg/kg/min (at max HR)  Stress Test Technologist: Mellody Memos, CCT Nuclear Technologist: Otho Perl, CNMT   Rest Procedure:  Myocardial perfusion imaging was performed at rest 45 minutes following the intravenous administration of Technetium 25m Sestamibi. Stress Procedure:  The patient received IV Lexiscan 0.4 mg over 15-seconds.  Technetium 61m Sestamibi injected at 30-seconds.  There were no significant changes with Lexiscan.  Quantitative spect images were obtained after  a 45 minute delay.  Transient Ischemic Dilatation (Normal <1.22):  0.96 Lung/Heart Ratio (Normal <0.45):  0.36 QGS EDV:  110 ml QGS ESV:  66 ml LV Ejection Fraction: 40%   Rest ECG: Atrial Fibrilliation, RBBB and inferior Q waves  Stress ECG: No significant change from baseline ECG  QPS Raw Data Images:  Normal; no motion artifact; normal heart/lung ratio. Stress Images:  There is decreased uptake in the inferior wall. Rest Images:  There is decreased uptake in the inferior wall. Subtraction (SDS):  No evidence of ischemia.  Impression Exercise Capacity:  Lexiscan with no exercise. BP Response:  Normal blood pressure response. Clinical Symptoms:  No significant symptoms noted. ECG Impression:  No significant ECG changes with Lexiscan. Comparison with Prior Nuclear Study: No significant change from previous study  Overall Impression:  Intermediate risk stress nuclear study with moderate sized fixed infererior, inferoseptal defect suggestive of scar.  LV Wall Motion:  Inferior akinesis - EF 40%.  Pixie Casino, MD, [facility] Board Certified in Nuclear Cardiology Attending Cardiologist [place], MD  03/03/2014 12:26 PM

## 2014-03-03 NOTE — Progress Notes (Signed)
2D Echocardiogram has been performed. 

## 2014-03-05 ENCOUNTER — Ambulatory Visit: Payer: Worker's Compensation | Admitting: Physical Therapy

## 2014-03-05 DIAGNOSIS — R269 Unspecified abnormalities of gait and mobility: Secondary | ICD-10-CM | POA: Diagnosis not present

## 2014-03-05 DIAGNOSIS — R5381 Other malaise: Secondary | ICD-10-CM | POA: Diagnosis not present

## 2014-03-05 DIAGNOSIS — R4189 Other symptoms and signs involving cognitive functions and awareness: Secondary | ICD-10-CM | POA: Diagnosis not present

## 2014-03-05 DIAGNOSIS — H8110 Benign paroxysmal vertigo, unspecified ear: Secondary | ICD-10-CM | POA: Diagnosis not present

## 2014-03-06 ENCOUNTER — Ambulatory Visit: Payer: Worker's Compensation | Admitting: *Deleted

## 2014-03-06 DIAGNOSIS — R5381 Other malaise: Secondary | ICD-10-CM | POA: Diagnosis not present

## 2014-03-06 DIAGNOSIS — R4189 Other symptoms and signs involving cognitive functions and awareness: Secondary | ICD-10-CM | POA: Diagnosis not present

## 2014-03-06 DIAGNOSIS — H8110 Benign paroxysmal vertigo, unspecified ear: Secondary | ICD-10-CM | POA: Diagnosis not present

## 2014-03-06 DIAGNOSIS — I480 Paroxysmal atrial fibrillation: Secondary | ICD-10-CM | POA: Insufficient documentation

## 2014-03-06 DIAGNOSIS — R269 Unspecified abnormalities of gait and mobility: Secondary | ICD-10-CM | POA: Diagnosis not present

## 2014-03-06 NOTE — Assessment & Plan Note (Signed)
Late subdural hematoma

## 2014-03-06 NOTE — Assessment & Plan Note (Signed)
Inf MI 1993

## 2014-03-06 NOTE — Assessment & Plan Note (Signed)
Hx. Of PCI to RCA and LAD no chest pain
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
## 2014-03-09 ENCOUNTER — Other Ambulatory Visit: Payer: Self-pay | Admitting: Cardiovascular Disease

## 2014-03-10 ENCOUNTER — Ambulatory Visit: Payer: Worker's Compensation | Admitting: Physical Therapy

## 2014-03-10 ENCOUNTER — Ambulatory Visit: Payer: Worker's Compensation | Admitting: *Deleted

## 2014-03-10 DIAGNOSIS — R4189 Other symptoms and signs involving cognitive functions and awareness: Secondary | ICD-10-CM | POA: Diagnosis not present

## 2014-03-10 DIAGNOSIS — R5381 Other malaise: Secondary | ICD-10-CM | POA: Diagnosis not present

## 2014-03-10 DIAGNOSIS — R269 Unspecified abnormalities of gait and mobility: Secondary | ICD-10-CM | POA: Diagnosis not present

## 2014-03-10 DIAGNOSIS — H8110 Benign paroxysmal vertigo, unspecified ear: Secondary | ICD-10-CM | POA: Diagnosis not present

## 2014-03-12 ENCOUNTER — Ambulatory Visit: Payer: Worker's Compensation | Attending: Neurological Surgery | Admitting: *Deleted

## 2014-03-12 ENCOUNTER — Ambulatory Visit: Payer: Self-pay | Admitting: Physical Therapy

## 2014-03-12 DIAGNOSIS — R269 Unspecified abnormalities of gait and mobility: Secondary | ICD-10-CM | POA: Insufficient documentation

## 2014-03-12 DIAGNOSIS — R4189 Other symptoms and signs involving cognitive functions and awareness: Secondary | ICD-10-CM | POA: Insufficient documentation

## 2014-03-12 DIAGNOSIS — R5381 Other malaise: Secondary | ICD-10-CM | POA: Insufficient documentation

## 2014-03-12 DIAGNOSIS — H8110 Benign paroxysmal vertigo, unspecified ear: Secondary | ICD-10-CM | POA: Insufficient documentation

## 2014-03-16 ENCOUNTER — Ambulatory Visit: Payer: Worker's Compensation

## 2014-03-16 ENCOUNTER — Ambulatory Visit: Payer: Self-pay | Admitting: Physical Therapy

## 2014-03-17 ENCOUNTER — Ambulatory Visit
Admission: RE | Admit: 2014-03-17 | Discharge: 2014-03-17 | Disposition: A | Discharge status: Home / Self Care | Payer: PRIVATE HEALTH INSURANCE | Source: Ambulatory Visit | Attending: Neurological Surgery | Admitting: Neurological Surgery

## 2014-03-17 DIAGNOSIS — S0990XA Unspecified injury of head, initial encounter: Secondary | ICD-10-CM

## 2014-03-20 ENCOUNTER — Ambulatory Visit: Payer: Self-pay | Admitting: Physical Therapy

## 2014-03-31 ENCOUNTER — Ambulatory Visit: Payer: Worker's Compensation | Admitting: Occupational Therapy

## 2014-03-31 ENCOUNTER — Ambulatory Visit: Payer: Self-pay | Admitting: Physical Therapy

## 2014-04-02 ENCOUNTER — Ambulatory Visit: Payer: Worker's Compensation | Admitting: Occupational Therapy

## 2014-04-02 ENCOUNTER — Ambulatory Visit: Payer: Self-pay | Admitting: Physical Therapy

## 2014-04-02 ENCOUNTER — Ambulatory Visit (INDEPENDENT_AMBULATORY_CARE_PROVIDER_SITE_OTHER): Payer: Medicare Other | Admitting: Cardiovascular Disease

## 2014-04-02 ENCOUNTER — Encounter: Payer: Self-pay | Admitting: Cardiovascular Disease

## 2014-04-02 VITALS — BP 132/72 | HR 60 | Ht 66.0 in | Wt 182.3 lb

## 2014-04-02 DIAGNOSIS — S065X9A Traumatic subdural hemorrhage with loss of consciousness of unspecified duration, initial encounter: Secondary | ICD-10-CM

## 2014-04-02 DIAGNOSIS — E785 Hyperlipidemia, unspecified: Secondary | ICD-10-CM

## 2014-04-02 DIAGNOSIS — S065XAA Traumatic subdural hemorrhage with loss of consciousness status unknown, initial encounter: Secondary | ICD-10-CM

## 2014-04-02 DIAGNOSIS — I6200 Nontraumatic subdural hemorrhage, unspecified: Secondary | ICD-10-CM

## 2014-04-02 DIAGNOSIS — S0990XA Unspecified injury of head, initial encounter: Secondary | ICD-10-CM

## 2014-04-02 DIAGNOSIS — I252 Old myocardial infarction: Secondary | ICD-10-CM | POA: Diagnosis not present

## 2014-04-02 DIAGNOSIS — I10 Essential (primary) hypertension: Secondary | ICD-10-CM | POA: Diagnosis not present

## 2014-04-02 DIAGNOSIS — I2510 Atherosclerotic heart disease of native coronary artery without angina pectoris: Secondary | ICD-10-CM

## 2014-04-02 DIAGNOSIS — I4891 Unspecified atrial fibrillation: Secondary | ICD-10-CM | POA: Diagnosis not present

## 2014-04-02 DIAGNOSIS — I480 Paroxysmal atrial fibrillation: Secondary | ICD-10-CM

## 2014-04-02 NOTE — Patient Instructions (Signed)
Your physician recommends that you schedule a follow-up appointment in 6 weeks. The cardionet monitor will be cancelled. Please return the monitor to them per instructions inside of the box.

## 2014-04-03 ENCOUNTER — Telehealth: Payer: Self-pay | Admitting: *Deleted

## 2014-04-03 NOTE — Telephone Encounter (Signed)
Spoke with Jeneen Rinks @ [facility] and informed him to cancel the order for the patient's monitor. He never wore it and Dr. Claiborne Billings decided  That he does not need it. The monitor was ordered by Cecilie Kicks at his previous visit.

## 2014-04-04 ENCOUNTER — Encounter: Payer: Self-pay | Admitting: Cardiovascular Disease

## 2014-04-04 DIAGNOSIS — S065X9A Traumatic subdural hemorrhage with loss of consciousness of unspecified duration, initial encounter: Secondary | ICD-10-CM | POA: Insufficient documentation

## 2014-04-04 DIAGNOSIS — S065XAA Traumatic subdural hemorrhage with loss of consciousness status unknown, initial encounter: Secondary | ICD-10-CM | POA: Insufficient documentation
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
## 2014-04-07 ENCOUNTER — Ambulatory Visit: Payer: Worker's Compensation | Admitting: Occupational Therapy

## 2014-04-07 ENCOUNTER — Ambulatory Visit: Payer: Worker's Compensation | Admitting: Physical Therapy

## 2014-04-07 ENCOUNTER — Ambulatory Visit: Payer: Worker's Compensation | Admitting: Speech Pathology

## 2014-04-09 ENCOUNTER — Ambulatory Visit: Payer: Worker's Compensation | Admitting: Occupational Therapy

## 2014-04-09 ENCOUNTER — Ambulatory Visit: Payer: Self-pay | Admitting: Physical Therapy

## 2014-04-14 ENCOUNTER — Ambulatory Visit: Payer: Worker's Compensation

## 2014-04-14 ENCOUNTER — Ambulatory Visit: Payer: Worker's Compensation | Attending: Neurological Surgery | Admitting: Occupational Therapy

## 2014-04-14 ENCOUNTER — Ambulatory Visit: Payer: Self-pay | Admitting: Physical Therapy

## 2014-04-14 DIAGNOSIS — R269 Unspecified abnormalities of gait and mobility: Secondary | ICD-10-CM | POA: Insufficient documentation

## 2014-04-14 DIAGNOSIS — R4189 Other symptoms and signs involving cognitive functions and awareness: Secondary | ICD-10-CM | POA: Insufficient documentation

## 2014-04-14 DIAGNOSIS — H8110 Benign paroxysmal vertigo, unspecified ear: Secondary | ICD-10-CM | POA: Insufficient documentation

## 2014-04-14 DIAGNOSIS — R5381 Other malaise: Secondary | ICD-10-CM | POA: Insufficient documentation

## 2014-04-15 ENCOUNTER — Ambulatory Visit: Payer: Worker's Compensation

## 2014-04-15 ENCOUNTER — Other Ambulatory Visit: Payer: Self-pay | Admitting: Cardiovascular Disease

## 2014-04-15 NOTE — Telephone Encounter (Signed)
Rx refill sent to patient pharmacy   

## 2014-04-16 ENCOUNTER — Ambulatory Visit: Payer: Worker's Compensation | Admitting: Occupational Therapy

## 2014-04-16 ENCOUNTER — Ambulatory Visit: Payer: Self-pay | Admitting: Physical Therapy

## 2014-04-17 ENCOUNTER — Other Ambulatory Visit: Payer: Self-pay | Admitting: Neurological Surgery

## 2014-04-17 DIAGNOSIS — S065XAA Traumatic subdural hemorrhage with loss of consciousness status unknown, initial encounter: Secondary | ICD-10-CM

## 2014-04-17 DIAGNOSIS — S065X9A Traumatic subdural hemorrhage with loss of consciousness of unspecified duration, initial encounter: Secondary | ICD-10-CM

## 2014-04-21 ENCOUNTER — Ambulatory Visit: Payer: Worker's Compensation

## 2014-04-21 ENCOUNTER — Ambulatory Visit: Payer: Worker's Compensation | Admitting: Occupational Therapy

## 2014-04-23 ENCOUNTER — Ambulatory Visit: Payer: Worker's Compensation

## 2014-04-23 ENCOUNTER — Ambulatory Visit: Payer: Worker's Compensation | Admitting: Occupational Therapy

## 2014-04-28 ENCOUNTER — Ambulatory Visit: Payer: Worker's Compensation | Admitting: Occupational Therapy

## 2014-04-28 ENCOUNTER — Encounter: Payer: Self-pay | Admitting: Speech Pathology

## 2014-04-28 ENCOUNTER — Ambulatory Visit: Payer: Worker's Compensation | Admitting: Speech Pathology

## 2014-04-28 ENCOUNTER — Other Ambulatory Visit: Payer: Self-pay

## 2014-04-28 ENCOUNTER — Ambulatory Visit
Admission: RE | Admit: 2014-04-28 | Discharge: 2014-04-28 | Disposition: A | Discharge status: Home / Self Care | Payer: Worker's Compensation | Source: Ambulatory Visit | Attending: Neurological Surgery | Admitting: Neurological Surgery

## 2014-04-28 DIAGNOSIS — S065X9A Traumatic subdural hemorrhage with loss of consciousness of unspecified duration, initial encounter: Secondary | ICD-10-CM

## 2014-04-28 DIAGNOSIS — S065XAA Traumatic subdural hemorrhage with loss of consciousness status unknown, initial encounter: Secondary | ICD-10-CM

## 2014-04-30 ENCOUNTER — Ambulatory Visit: Payer: Worker's Compensation | Admitting: Occupational Therapy

## 2014-04-30 ENCOUNTER — Ambulatory Visit: Payer: Worker's Compensation

## 2014-05-05 ENCOUNTER — Ambulatory Visit: Payer: Worker's Compensation

## 2014-05-05 ENCOUNTER — Encounter: Payer: Self-pay | Admitting: Occupational Therapy

## 2014-05-07 ENCOUNTER — Encounter: Payer: Self-pay | Admitting: Occupational Therapy

## 2014-05-07 ENCOUNTER — Ambulatory Visit: Payer: Worker's Compensation

## 2014-05-12 ENCOUNTER — Encounter: Payer: Self-pay | Admitting: Occupational Therapy

## 2014-05-12 ENCOUNTER — Ambulatory Visit: Payer: Worker's Compensation | Attending: Neurological Surgery

## 2014-05-12 DIAGNOSIS — R269 Unspecified abnormalities of gait and mobility: Secondary | ICD-10-CM | POA: Insufficient documentation

## 2014-05-12 DIAGNOSIS — R4189 Other symptoms and signs involving cognitive functions and awareness: Secondary | ICD-10-CM | POA: Insufficient documentation

## 2014-05-12 DIAGNOSIS — H1130 Conjunctival hemorrhage, unspecified eye: Secondary | ICD-10-CM | POA: Diagnosis not present

## 2014-05-12 DIAGNOSIS — H8110 Benign paroxysmal vertigo, unspecified ear: Secondary | ICD-10-CM | POA: Insufficient documentation

## 2014-05-12 DIAGNOSIS — R5381 Other malaise: Secondary | ICD-10-CM | POA: Insufficient documentation

## 2014-05-12 DIAGNOSIS — H04129 Dry eye syndrome of unspecified lacrimal gland: Secondary | ICD-10-CM | POA: Diagnosis not present

## 2014-05-12 DIAGNOSIS — Z961 Presence of intraocular lens: Secondary | ICD-10-CM | POA: Diagnosis not present

## 2014-05-13 ENCOUNTER — Ambulatory Visit (INDEPENDENT_AMBULATORY_CARE_PROVIDER_SITE_OTHER): Payer: Medicare Other | Admitting: Cardiovascular Disease

## 2014-05-13 VITALS — BP 114/70 | HR 64 | Ht 66.0 in | Wt 180.0 lb

## 2014-05-13 DIAGNOSIS — I4891 Unspecified atrial fibrillation: Secondary | ICD-10-CM

## 2014-05-13 DIAGNOSIS — I2510 Atherosclerotic heart disease of native coronary artery without angina pectoris: Secondary | ICD-10-CM

## 2014-05-13 DIAGNOSIS — I10 Essential (primary) hypertension: Secondary | ICD-10-CM | POA: Diagnosis not present

## 2014-05-13 DIAGNOSIS — I252 Old myocardial infarction: Secondary | ICD-10-CM

## 2014-05-13 DIAGNOSIS — I480 Paroxysmal atrial fibrillation: Secondary | ICD-10-CM

## 2014-05-13 DIAGNOSIS — E785 Hyperlipidemia, unspecified: Secondary | ICD-10-CM

## 2014-05-13 NOTE — Patient Instructions (Signed)
Your physician recommends that you schedule a follow-up appointment in: 6 months. No changes were made in your therapy today.

## 2014-05-14 ENCOUNTER — Ambulatory Visit: Payer: Worker's Compensation

## 2014-05-14 ENCOUNTER — Encounter: Payer: Self-pay | Admitting: Occupational Therapy

## 2014-05-15 ENCOUNTER — Other Ambulatory Visit: Payer: Self-pay | Admitting: Cardiovascular Disease

## 2014-05-18 ENCOUNTER — Telehealth: Payer: Self-pay | Admitting: Cardiovascular Disease

## 2014-05-18 NOTE — Telephone Encounter (Signed)
Both medications were sent in in March 2015 for #90 +3 refills. Left voicemail and informed patient they may need to just call the pharmacy and let them know he was ready for another supply, if any questions give [place] a call back.

## 2014-05-18 NOTE — Telephone Encounter (Signed)
Need a new prescription for his Ramipril 10 mg #90 and refills also Isosorbide Mono ER 60 mg #90.Please call to [facility]

## 2014-05-18 NOTE — Telephone Encounter (Signed)
Refilled refused.  Refilled #90 capsules with 3 refills on 02/24/2014

## 2014-05-21 ENCOUNTER — Encounter: Payer: Self-pay | Admitting: Cardiovascular Disease
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
## 2014-05-23 ENCOUNTER — Other Ambulatory Visit: Payer: Self-pay | Admitting: Cardiovascular Disease

## 2014-05-25 NOTE — Telephone Encounter (Signed)
Rx was sent to pharmacy electronically. 

## 2014-05-26 DIAGNOSIS — H53009 Unspecified amblyopia, unspecified eye: Secondary | ICD-10-CM | POA: Diagnosis not present

## 2014-05-26 DIAGNOSIS — H04129 Dry eye syndrome of unspecified lacrimal gland: Secondary | ICD-10-CM | POA: Diagnosis not present

## 2014-05-26 DIAGNOSIS — Z961 Presence of intraocular lens: Secondary | ICD-10-CM | POA: Diagnosis not present

## 2014-05-26 DIAGNOSIS — H26499 Other secondary cataract, unspecified eye: Secondary | ICD-10-CM | POA: Diagnosis not present

## 2014-06-09 DIAGNOSIS — I6200 Nontraumatic subdural hemorrhage, unspecified: Secondary | ICD-10-CM | POA: Diagnosis not present

## 2014-06-09 DIAGNOSIS — I4891 Unspecified atrial fibrillation: Secondary | ICD-10-CM | POA: Diagnosis not present

## 2014-06-09 DIAGNOSIS — I10 Essential (primary) hypertension: Secondary | ICD-10-CM | POA: Diagnosis not present

## 2014-06-09 DIAGNOSIS — E785 Hyperlipidemia, unspecified: Secondary | ICD-10-CM | POA: Diagnosis not present

## 2014-06-09 DIAGNOSIS — Z23 Encounter for immunization: Secondary | ICD-10-CM | POA: Diagnosis not present

## 2014-06-11 NOTE — Telephone Encounter (Signed)
Encounter complete. 

## 2014-09-17 DIAGNOSIS — Z23 Encounter for immunization: Secondary | ICD-10-CM | POA: Diagnosis not present

## 2014-09-28 ENCOUNTER — Telehealth: Payer: Self-pay | Admitting: Cardiovascular Disease

## 2014-09-28 ENCOUNTER — Encounter: Payer: Self-pay | Admitting: Cardiovascular Disease

## 2014-09-30 NOTE — Telephone Encounter (Signed)
Closed encounter °

## 2014-11-13 ENCOUNTER — Ambulatory Visit (INDEPENDENT_AMBULATORY_CARE_PROVIDER_SITE_OTHER): Payer: Medicare Other | Admitting: Cardiovascular Disease

## 2014-11-13 ENCOUNTER — Encounter: Payer: Self-pay | Admitting: Cardiovascular Disease

## 2014-11-13 VITALS — BP 110/62 | HR 65

## 2014-11-13 DIAGNOSIS — I252 Old myocardial infarction: Secondary | ICD-10-CM

## 2014-11-13 DIAGNOSIS — I482 Chronic atrial fibrillation, unspecified: Secondary | ICD-10-CM

## 2014-11-13 DIAGNOSIS — I2510 Atherosclerotic heart disease of native coronary artery without angina pectoris: Secondary | ICD-10-CM

## 2014-11-13 DIAGNOSIS — E785 Hyperlipidemia, unspecified: Secondary | ICD-10-CM | POA: Diagnosis not present

## 2014-11-13 DIAGNOSIS — S065XAA Traumatic subdural hemorrhage with loss of consciousness status unknown, initial encounter: Secondary | ICD-10-CM

## 2014-11-13 DIAGNOSIS — I2583 Coronary atherosclerosis due to lipid rich plaque: Secondary | ICD-10-CM

## 2014-11-13 DIAGNOSIS — I10 Essential (primary) hypertension: Secondary | ICD-10-CM

## 2014-11-13 DIAGNOSIS — I6200 Nontraumatic subdural hemorrhage, unspecified: Secondary | ICD-10-CM

## 2014-11-13 DIAGNOSIS — S065X9A Traumatic subdural hemorrhage with loss of consciousness of unspecified duration, initial encounter: Secondary | ICD-10-CM

## 2014-11-13 NOTE — Patient Instructions (Signed)
Your physician wants you to follow-up in: 6 months or sooner if needed. No changes were made today in your therapy.You will receive a reminder letter in the mail two months in advance. If you don't receive a letter, please call our office to schedule the follow-up appointment.  

## 2014-11-15 ENCOUNTER — Encounter: Payer: Self-pay | Admitting: Cardiovascular Disease

## 2014-11-15 DIAGNOSIS — I482 Chronic atrial fibrillation, unspecified: Secondary | ICD-10-CM | POA: Insufficient documentation
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
## 2014-11-16 ENCOUNTER — Ambulatory Visit: Payer: Self-pay | Admitting: Cardiovascular Disease

## 2014-12-07 NOTE — Addendum Note (Signed)
Addended by: Vennie Homans on: 12/07/2014 09:28 AM   Modules accepted: Orders

## 2015-02-05 ENCOUNTER — Other Ambulatory Visit: Payer: Self-pay | Admitting: Cardiovascular Disease

## 2015-03-01 DIAGNOSIS — R5383 Other fatigue: Secondary | ICD-10-CM | POA: Diagnosis not present

## 2015-03-08 DIAGNOSIS — F09 Unspecified mental disorder due to known physiological condition: Secondary | ICD-10-CM | POA: Diagnosis not present

## 2015-03-23 ENCOUNTER — Other Ambulatory Visit: Payer: Self-pay | Admitting: Internal Medicine

## 2015-03-23 DIAGNOSIS — S065XAA Traumatic subdural hemorrhage with loss of consciousness status unknown, initial encounter: Secondary | ICD-10-CM

## 2015-03-23 DIAGNOSIS — S065X9A Traumatic subdural hemorrhage with loss of consciousness of unspecified duration, initial encounter: Secondary | ICD-10-CM

## 2015-03-31 ENCOUNTER — Other Ambulatory Visit: Payer: Self-pay

## 2015-04-01 ENCOUNTER — Other Ambulatory Visit: Payer: Self-pay | Admitting: *Deleted

## 2015-04-01 MED ORDER — METOPROLOL SUCCINATE ER 50 MG PO TB24: ORAL_TABLET | ORAL | Status: DC

## 2015-04-01 MED ORDER — AMLODIPINE-ATORVASTATIN 5-10 MG PO TABS: 1.0000 | ORAL_TABLET | Freq: Every day | ORAL | Status: DC

## 2015-04-01 NOTE — Telephone Encounter (Signed)
Rx(s) sent to pharmacy electronically.  

## 2015-05-13 ENCOUNTER — Encounter: Payer: Self-pay | Admitting: Cardiovascular Disease

## 2015-05-13 ENCOUNTER — Ambulatory Visit (INDEPENDENT_AMBULATORY_CARE_PROVIDER_SITE_OTHER): Payer: Medicare Other | Admitting: Cardiovascular Disease

## 2015-05-13 VITALS — BP 102/56 | HR 68 | Ht 66.0 in | Wt 169.0 lb

## 2015-05-13 DIAGNOSIS — I10 Essential (primary) hypertension: Secondary | ICD-10-CM | POA: Diagnosis not present

## 2015-05-13 DIAGNOSIS — I252 Old myocardial infarction: Secondary | ICD-10-CM | POA: Diagnosis not present

## 2015-05-13 DIAGNOSIS — I2510 Atherosclerotic heart disease of native coronary artery without angina pectoris: Secondary | ICD-10-CM | POA: Diagnosis not present

## 2015-05-13 DIAGNOSIS — I482 Chronic atrial fibrillation, unspecified: Secondary | ICD-10-CM

## 2015-05-13 DIAGNOSIS — I2583 Coronary atherosclerosis due to lipid rich plaque: Secondary | ICD-10-CM

## 2015-05-13 DIAGNOSIS — I4891 Unspecified atrial fibrillation: Secondary | ICD-10-CM

## 2015-05-13 MED ORDER — DOXAZOSIN MESYLATE 1 MG PO TABS: 1.0000 mg | ORAL_TABLET | Freq: Every day | ORAL | Status: DC

## 2015-05-13 MED ORDER — RAMIPRIL 10 MG PO CAPS: 10.0000 mg | ORAL_CAPSULE | Freq: Every day | ORAL | Status: DC

## 2015-05-13 MED ORDER — ISOSORBIDE MONONITRATE ER 60 MG PO TB24: 60.0000 mg | ORAL_TABLET | Freq: Every day | ORAL | Status: DC

## 2015-05-13 MED ORDER — AMLODIPINE-ATORVASTATIN 5-10 MG PO TABS: 1.0000 | ORAL_TABLET | Freq: Every day | ORAL | Status: DC

## 2015-05-13 MED ORDER — METOPROLOL SUCCINATE ER 50 MG PO TB24: ORAL_TABLET | ORAL | Status: DC

## 2015-05-13 NOTE — Patient Instructions (Addendum)
Your physician has recommended you make the following change in your medication: decrease the doxazosin to 1 mg.   Your physician recommends that you schedule a follow-up appointment in: 4 months or sooner if needed.

## 2015-05-15 ENCOUNTER — Encounter: Payer: Self-pay | Admitting: Cardiovascular Disease
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
## 2015-06-07 ENCOUNTER — Other Ambulatory Visit: Payer: Self-pay

## 2015-06-15 DIAGNOSIS — E780 Pure hypercholesterolemia: Secondary | ICD-10-CM | POA: Diagnosis not present

## 2015-06-15 DIAGNOSIS — Z0000 Encounter for general adult medical examination without abnormal findings: Secondary | ICD-10-CM | POA: Diagnosis not present

## 2015-06-15 DIAGNOSIS — Z1389 Encounter for screening for other disorder: Secondary | ICD-10-CM | POA: Diagnosis not present

## 2015-06-15 DIAGNOSIS — F0390 Unspecified dementia without behavioral disturbance: Secondary | ICD-10-CM | POA: Diagnosis not present

## 2015-06-15 DIAGNOSIS — I4891 Unspecified atrial fibrillation: Secondary | ICD-10-CM | POA: Diagnosis not present

## 2015-06-15 DIAGNOSIS — I10 Essential (primary) hypertension: Secondary | ICD-10-CM | POA: Diagnosis not present

## 2015-06-21 ENCOUNTER — Telehealth: Payer: Self-pay | Admitting: Cardiovascular Disease

## 2015-06-21 NOTE — Telephone Encounter (Signed)
Closed encounter °

## 2015-06-25 ENCOUNTER — Telehealth: Payer: Self-pay | Admitting: Cardiovascular Disease

## 2015-06-28 NOTE — Telephone Encounter (Signed)
Close encounter 

## 2015-09-07 DIAGNOSIS — Z23 Encounter for immunization: Secondary | ICD-10-CM | POA: Diagnosis not present

## 2015-09-14 ENCOUNTER — Encounter: Payer: Self-pay | Admitting: Cardiovascular Disease

## 2015-09-14 ENCOUNTER — Ambulatory Visit (INDEPENDENT_AMBULATORY_CARE_PROVIDER_SITE_OTHER): Payer: Medicare Other | Admitting: Cardiovascular Disease

## 2015-09-14 VITALS — BP 116/76 | HR 60 | Ht 66.0 in | Wt 169.6 lb

## 2015-09-14 DIAGNOSIS — E785 Hyperlipidemia, unspecified: Secondary | ICD-10-CM

## 2015-09-14 DIAGNOSIS — I482 Chronic atrial fibrillation, unspecified: Secondary | ICD-10-CM

## 2015-09-14 DIAGNOSIS — I480 Paroxysmal atrial fibrillation: Secondary | ICD-10-CM

## 2015-09-14 DIAGNOSIS — I10 Essential (primary) hypertension: Secondary | ICD-10-CM

## 2015-09-14 DIAGNOSIS — I2510 Atherosclerotic heart disease of native coronary artery without angina pectoris: Secondary | ICD-10-CM

## 2015-09-14 DIAGNOSIS — I252 Old myocardial infarction: Secondary | ICD-10-CM | POA: Diagnosis not present

## 2015-09-14 DIAGNOSIS — I2583 Coronary atherosclerosis due to lipid rich plaque: Secondary | ICD-10-CM

## 2015-09-14 NOTE — Progress Notes (Signed)
Patient ID: Joseph Winters, male   DOB: Mar 19, 1935, 80 y.o.   MRN: [id number]     HPI: Joseph Winters is a 80 y.o. male who presents to the office today for a 4 month follow up cardiology evaluation.  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 Due to concomitant CAD, he underwent staged intervention to his LAD several days later. His last intervention was in June 1994 for in-stent restenosis at both sites and he underwent repeat PTCA.  Over the past 22 years, he has been stable on aggressive medical therapy. A nuclear perfusion study in September 2013 was unchanged from previously and showed moderate to severe inferior scar without associated ischemia and his ejection fraction post stress was 43%. He has a history of hypertension as well as hyperlipidemia.   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 He was hospitalized and was found to have subarachnoid hemorrhage and small contusion. He did experience some confusion initially which resolved. He has seen Dr. Sherley Bounds from neurosurgery. 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 There is a small volume of scattered subarachnoid hemorrhage, small left middle cranial fossa subdural hemorrhage, and probable left temporal lobe hemorrhagic contusion with an associated nondisplaced right occipital and parietal skull fracture.  When I saw him in March 2015 he had noted some forgetfulness and some confusion intermittently. He underwent another CT scan which suggested acute to subacute on chronic right subdural hematoma with mild mass effect which was new. He did not have significant midline shift. There is resolution of prior parenchymal contusions and subarachnoid hemorrhage on the left. He did see Dr.Jones in followup and a head CT demonstrated improvement.   In March 2015, he was found to be in atrial  fibrillation when he presented for his echo Doppler study. Ejection fraction was 35-40%. A nuclear perfusion study showed inferior akinesis, and an ejection fraction of 40% with prior fixed, inferior, inferoseptal scar, without associated ischemia. Due to his prior CNS bleed, he is not a candidate for anticoagulation. When I last saw him his atrial fibrillation rate has been controlled with Toprol-XL 125 mg. I reduced his aspirin to 81 mg daily. He has been on  Toprol-XL 125 g, doxazocin 2 mg, Caduet 5/10 and ramipril10 mg  4.  Hypertension. He is tolerated the atorvastatin in Caduet with reference to his hyperlipidemia. He presents now for six-month follow-up evaluation.   He has been undergoing cognitive rehabilitation.  Since I last saw him, he denies any chest pain or shortness of breath.  He denies awareness of his atrial fibrillation. There is no presyncope or syncope. He denies significant leg swelling. He does have some mild memory issues. He presents for evaluation.  Past Medical History  Diagnosis Date  . Coronary artery disease   . Hypertension 08/28/2008    Echo EF 45-50%  . Myocardial infarction ([place]) 1993  . Dysrhythmia 2009    24hr holter-PVC'S    Past Surgical History  Procedure Laterality Date  . Coronary angioplasty      initial inferior MI 1993. 1994 Repeat PTCA    No Known Allergies  Current Outpatient Prescriptions  Medication Sig Dispense Refill  . amLODipine-atorvastatin (CADUET) 5-10 MG per tablet Take 1 tablet by mouth daily. 90 tablet 3  . aspirin 81 MG tablet Take 81 mg by mouth daily.    . Bioflavonoid Products (ESTER C PO) Take  500 mg by mouth daily.    Marland Kitchen doxazosin (CARDURA) 1 MG tablet Take 1 tablet (1 mg total) by mouth daily. 90 tablet 3  . isosorbide mononitrate (IMDUR) 60 MG 24 hr tablet Take 1 tablet (60 mg total) by mouth daily. 90 tablet 3  . metoprolol succinate (TOPROL-XL) 50 MG 24 hr tablet Take 2.5 tablets (177m) by mouth daily 225 tablet 3  .  Multiple Vitamins-Minerals (OCUVITE ADULT FORMULA PO) Take 1 capsule by mouth daily.    . ramipril (ALTACE) 10 MG capsule Take 1 capsule (10 mg total) by mouth daily. 90 capsule 3   No current facility-administered medications for this visit.    Social History   Social History  . Marital Status: Married    Spouse Name: N/A  . Number of Children: N/A  . Years of Education: N/A   Occupational History  . Not on file.   Social History Main Topics  . Smoking status: Former Smoker    Types: Cigarettes    Quit date: 12/11/1985  . Smokeless tobacco: Never Used  . Alcohol Use: Yes  . Drug Use: No  . Sexual Activity: Not on file   Other Topics Concern  . Not on file   Social History Narrative   Family history is notable that both parents are deceased. History reviewed. No pertinent family history.  ROS General: Negative; No fevers, chills, or night sweats HEENT: Negative; No changes in vision or hearing, sinus congestion, difficulty swallowing Pulmonary: Negative; No cough, wheezing, shortness of breath, hemoptysis Cardiovascular: See HPI: No chest pain, presyncope, syncope, palpatations GI: Negative; No nausea, vomiting, diarrhea, or abdominal pain GU: Negative; No dysuria, hematuria, or difficulty voiding Musculoskeletal: Negative; no myalgias, joint pain, or weakness Hematologic: Negative; no easy bruising, bleeding Endocrine: Negative; no heat/cold intolerance; no diabetes, Neuro: Negative; no changes in balance, headaches Skin: Negative; No rashes or skin lesions Psychiatric: Negative; No behavioral problems, depression Sleep: Negative; No snoring,  daytime sleepiness, hypersomnolence, bruxism, restless legs, hypnogognic hallucinations. Other comprehensive 14 point system review is negative   Physical Exam BP 116/76 mmHg  Pulse 60  Ht _0  (1.676 m)  Wt 169 lb 9.6 oz (76.93 kg)  BMI 27.39 kg/m2   Wt Readings from Last 3 Encounters:  09/14/15 169 lb 9.6 oz (76.93  kg)  05/13/15 169 lb (76.658 kg)  05/13/14 180 lb (81.647 kg)   General: Alert, oriented, no distress.  Skin: normal turgor, no rashes, warm and dry HEENT: Normocephalic, atraumatic. Pupils equal round and reactive to light; sclera anicteric; extraocular muscles intact, No lid lag; Nose without nasal septal hypertrophy; Mouth/Parynx benign; Mallinpatti scale 3 Neck: No JVD, no carotid bruits; normal carotid upstroke Lungs: clear to ausculatation and percussion bilaterally; no wheezing or rales, normal inspiratory and expiratory effort Chest wall: without tenderness to palpitation Heart: PMI not displaced, RRR, s1 s2 normal, 1/6 systolic murmur, No diastolic murmur, no rubs, gallops, thrills, or heaves Abdomen: soft, nontender; no hepatosplenomehaly, BS+; abdominal aorta nontender and not dilated by palpation. Back: no CVA tenderness Pulses: 2+  Musculoskeletal: full range of motion, normal strength, no joint deformities Extremities: Pulses 2+, no clubbing cyanosis or edema, Homan's sign negative  Neurologic: grossly nonfocal; Cranial nerves grossly wnl Psychologic: Normal mood and affect  ECG (independently read by me): Atrial fibrillation at 60 bpm.  Nonspecific interventricular conduction delay.  Old inferior Q waves.  Poor anterior R-wave progression.  QTc interval 442 ms.  June 2016 ECG (independently read by me):  Atrial fibrillation  with a controlled ventricular response in the 60s.  Left bundle branch block with repolarization changes.  December 2015ECG (independently read by me): Atrial fibrillation. Ventricular rate averaging 65 bpm. Old inferior infarction. Poor R wave progression. PVC.   June 2015 ECG today (independently read by me): Atrial fibrillation with a controlled ventricular rate at 64 beats per minute. Old Q waves in leads 3 and aVF. Incomplete left bundle branch block.   ECG 04/02/14 (independently read by me): Atrial fibrillation with ventricular rate at 60. Old  inferior Q waves in leads 3 and aVF. Incomplete left bundle branch block   Prior 02/18/2014 ECG (independently read by me): Sinus bradycardia 52 beats per minute with a PAC. There also is T wave inversion in leads V4 through V6 as well as lead 2 and 3. There is left axis deviation. In comparison to his prior ECG one year previously in our office his T wave changes in V4 through V6 are more pronounced.    LABS:  BMP Latest Ref Rng 01/21/2014 01/21/2014  Glucose 70 - 99 mg/dL 109(H) 110(H)  BUN 6 - 23 mg/dL 12 13  Creatinine 0.50 - 1.35 mg/dL 0.90 0.80  Sodium 137 - 147 mEq/L 133(L) 131(L)  Potassium 3.7 - 5.3 mEq/L 3.9 3.9  Chloride 96 - 112 mEq/L 94(L) 94(L)  CO2 19 - 32 mEq/L - 25  Calcium 8.4 - 10.5 mg/dL - 9.1    Hepatic Function Latest Ref Rng 01/21/2014  Total Protein 6.0 - 8.3 g/dL 6.6  Albumin 3.5 - 5.2 g/dL 4.0  AST 0 - 37 U/L 24  ALT 0 - 53 U/L 19  Alk Phosphatase 39 - 117 U/L 53  Total Bilirubin 0.3 - 1.2 mg/dL 0.6    CBC Latest Ref Rng 01/21/2014 01/21/2014  WBC 4.0 - 10.5 K/uL - 5.7  Hemoglobin 13.0 - 17.0 g/dL 14.3 13.2  Hematocrit 39.0 - 52.0 % 42.0 37.1(L)  Platelets 150 - 400 K/uL - 191   Lab Results  Component Value Date   MCV 88.5 01/21/2014    No results found for: TSH  BNP No results found for: BNP  ProBNP No results found for: PROBNP   Lipid Panel  No results found for: CHOL, TRIG, HDL, CHOLHDL, VLDL, LDLCALC, LDLDIRECT   RADIOLOGY: No results found.    ASSESSMENT AND PLAN: Mr. Esper is a 80 year old gentleman who is 23 years status post inferolateral wall MI treated with PCI to his RCA and stage intervention to his LAD. He developed in-stent restenosis one year later in June 1994 but has been stable with reference to his CAD.  Since that time. His last nuclear study showed inferior scar with an EF of 40%. He has permanent atrial fibrillation.  His ventricular rate is well-controlled. His blood pressure today is is improved.  When I had last  seen him, his blood pressure was low and I reduced his doxazosin dose and dose from 2 mg down to 1 mg at bedtime.  He denies any episodes of chest pain.  He will be seeing Dr. Maple Mirza at [facility] for neurological follow-up.  He is unaware of his atrial fibrillation which is rate controlled.  He is on aspirin alone.  It is not a candidate for Coumadin anticoagulation in light of his previous CNS bleed.  I will see him in 6 months for cardiology reevaluation or sooner if problems arise.  Time spent: 25 minutes  Troy Sine, MD, [facility]  09/14/2015 10:04 AM

## 2015-09-14 NOTE — Patient Instructions (Signed)
Your physician wants you to follow-up in: 6 months or sooner if needed. You will receive a reminder letter in the mail two months in advance. If you don't receive a letter, please call our office to schedule the follow-up appointment. 

## 2015-11-16 DIAGNOSIS — G309 Alzheimer's disease, unspecified: Secondary | ICD-10-CM | POA: Diagnosis not present

## 2015-11-16 DIAGNOSIS — R4189 Other symptoms and signs involving cognitive functions and awareness: Secondary | ICD-10-CM | POA: Diagnosis not present

## 2016-01-25 ENCOUNTER — Telehealth: Payer: Self-pay | Admitting: Cardiovascular Disease

## 2016-01-25 MED ORDER — RAMIPRIL 10 MG PO CAPS: 10.0000 mg | ORAL_CAPSULE | Freq: Every day | ORAL | Status: DC

## 2016-01-25 MED ORDER — ISOSORBIDE MONONITRATE ER 60 MG PO TB24: 60.0000 mg | ORAL_TABLET | Freq: Every day | ORAL | Status: DC

## 2016-01-25 MED ORDER — AMLODIPINE-ATORVASTATIN 5-10 MG PO TABS: 1.0000 | ORAL_TABLET | Freq: Every day | ORAL | Status: DC

## 2016-01-25 MED ORDER — DOXAZOSIN MESYLATE 1 MG PO TABS: 1.0000 mg | ORAL_TABLET | Freq: Every day | ORAL | Status: DC

## 2016-01-25 MED ORDER — METOPROLOL SUCCINATE ER 50 MG PO TB24: ORAL_TABLET | ORAL | Status: DC

## 2016-01-25 NOTE — Telephone Encounter (Signed)
Refills sent

## 2016-01-25 NOTE — Telephone Encounter (Signed)
New message   [phone]  Calling or Escript    [phone] fax   [place] delivery  Medications:   Metoprol 50mg    2 and half daily Isosorbide 60mg   1 daily Doxazosin mesylate tab 1mg  1 day Ramipril 10mg  1 daily Amlod/atorva 5/10 1 daily

## 2016-02-24 DIAGNOSIS — Z79899 Other long term (current) drug therapy: Secondary | ICD-10-CM | POA: Diagnosis not present

## 2016-02-24 DIAGNOSIS — I10 Essential (primary) hypertension: Secondary | ICD-10-CM | POA: Diagnosis not present

## 2016-02-24 DIAGNOSIS — G301 Alzheimer's disease with late onset: Secondary | ICD-10-CM | POA: Diagnosis not present

## 2016-02-24 DIAGNOSIS — R69 Illness, unspecified: Secondary | ICD-10-CM | POA: Diagnosis not present

## 2016-02-24 DIAGNOSIS — Z87891 Personal history of nicotine dependence: Secondary | ICD-10-CM | POA: Diagnosis not present

## 2016-02-24 DIAGNOSIS — I252 Old myocardial infarction: Secondary | ICD-10-CM | POA: Diagnosis not present

## 2016-02-24 DIAGNOSIS — Z87820 Personal history of traumatic brain injury: Secondary | ICD-10-CM | POA: Diagnosis not present

## 2016-04-27 ENCOUNTER — Encounter: Payer: Self-pay | Admitting: Cardiovascular Disease

## 2016-04-27 ENCOUNTER — Ambulatory Visit (INDEPENDENT_AMBULATORY_CARE_PROVIDER_SITE_OTHER): Payer: Medicare HMO | Admitting: Cardiovascular Disease

## 2016-04-27 VITALS — BP 141/66 | HR 55 | Ht 64.0 in | Wt 165.8 lb

## 2016-04-27 DIAGNOSIS — I482 Chronic atrial fibrillation, unspecified: Secondary | ICD-10-CM

## 2016-04-27 DIAGNOSIS — I2510 Atherosclerotic heart disease of native coronary artery without angina pectoris: Secondary | ICD-10-CM

## 2016-04-27 DIAGNOSIS — I10 Essential (primary) hypertension: Secondary | ICD-10-CM

## 2016-04-27 DIAGNOSIS — I480 Paroxysmal atrial fibrillation: Secondary | ICD-10-CM

## 2016-04-27 DIAGNOSIS — I252 Old myocardial infarction: Secondary | ICD-10-CM

## 2016-04-27 DIAGNOSIS — I2583 Coronary atherosclerosis due to lipid rich plaque: Secondary | ICD-10-CM

## 2016-04-27 MED ORDER — METOPROLOL SUCCINATE ER 50 MG PO TB24: ORAL_TABLET | ORAL | Status: DC

## 2016-04-27 NOTE — Patient Instructions (Addendum)
Your physician has recommended you make the following change in your medication:   1.) the metoprolol has been changed to 100 mg daily ( 2 tablets)  Your physician recommends that you return for lab work fasting.  Your physician recommends that you schedule a follow-up appointment in: 2-3 months with Dr Claiborne Billings.

## 2016-04-29 ENCOUNTER — Encounter: Payer: Self-pay | Admitting: Cardiovascular Disease

## 2016-04-29 NOTE — Progress Notes (Signed)
Patient ID: MUBASHIR MALLEK, male   DOB: [birth year], 81 y.o.   MRN: [id number]     HPI: Joseph Winters is a 81 y.o. male who presents to the office today for a 7 month follow up cardiology evaluation.  Mr. Arkwright suffered an inferior wall myocardial infarction in November 1993 and underwent initial intervention to his RCA. Due to concomitant CAD, he underwent staged intervention to his LAD several days later. His last intervention was in June 1994 for in-stent restenosis at both sites and he underwent repeat PTCA.  Over the past 22 years, he has been stable on aggressive medical therapy. A nuclear perfusion study in September 2013 was unchanged from previously and showed moderate to severe inferior scar without associated ischemia and his ejection fraction post stress was 43%. He has a history of hypertension as well as hyperlipidemia.   Mr. 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 He was hospitalized and was found to have subarachnoid hemorrhage and small contusion. He did experience some confusion initially which resolved. He has seen Dr. Sherley Bounds from neurosurgery. 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 There is a small volume of scattered subarachnoid hemorrhage, small left middle cranial fossa subdural hemorrhage, and probable left temporal lobe hemorrhagic contusion with an associated nondisplaced right occipital and parietal skull fracture.  When I saw him in March 2015 he had noted some forgetfulness and some confusion intermittently. He underwent another CT scan which suggested acute to subacute on chronic right subdural hematoma with mild mass effect which was new. He did not have significant midline shift. There is resolution of prior parenchymal contusions and subarachnoid hemorrhage on the left. He did see Dr.Jones in followup and a head CT demonstrated improvement.   In March 2015, he was found to be in atrial  fibrillation when he presented for his echo Doppler study. Ejection fraction was 35-40%. A nuclear perfusion study showed inferior akinesis, and an ejection fraction of 40% with prior fixed, inferior, inferoseptal scar, without associated ischemia. Due to his prior CNS bleed, he is not a candidate for anticoagulation. His atrial fibrillation rate has been controlled with Toprol-XL 125 mg. I reduced his aspirin to 81 mg daily. He has been on  Toprol-XL 125 g, doxazocin 2 mg, Caduet 5/10 and ramipril10 mg for hypertension. He is tolerated the atorvastatin in Caduet with reference to his hyperlipidemia.  He has been undergoing cognitive rehabilitation.  Since I last saw him, he was reevaluated at [facility] by his neurologist.  He will also be undergoing further evaluation at [facility] memory clinic.  He denies any chest pain or shortness of breath.  He denies awareness of his permanent atrial fibrillation. There is no presyncope or syncope. He denies significant leg swelling. He does have some mild memory issues. He presents for evaluation.  Past Medical History  Diagnosis Date  . Coronary artery disease   . Hypertension 08/28/2008    Echo EF 45-50%  . Myocardial infarction ([place]) 1993  . Dysrhythmia 2009    24hr holter-PVC'S    Past Surgical History  Procedure Laterality Date  . Coronary angioplasty      initial inferior MI 1993. 1994 Repeat PTCA    No Known Allergies  Current Outpatient Prescriptions  Medication Sig Dispense Refill  . amLODipine-atorvastatin (CADUET) 5-10 MG tablet Take 1 tablet by mouth daily. 90 tablet 3  . aspirin 81 MG tablet Take 81 mg by mouth daily.    Marland Kitchen  Bioflavonoid Products (ESTER C PO) Take 500 mg by mouth daily.    Marland Kitchen doxazosin (CARDURA) 1 MG tablet Take 1 tablet (1 mg total) by mouth daily. 90 tablet 3  . isosorbide mononitrate (IMDUR) 60 MG 24 hr tablet Take 1 tablet (60 mg total) by mouth daily. 90 tablet 3  . metoprolol succinate (TOPROL-XL) 50 MG 24  hr tablet Take 2.tablets (149m) by mouth daily 225 tablet 3  . Multiple Vitamins-Minerals (OCUVITE ADULT FORMULA PO) Take 1 capsule by mouth daily.    . ramipril (ALTACE) 10 MG capsule Take 1 capsule (10 mg total) by mouth daily. 90 capsule 3   No current facility-administered medications for this visit.    Social History   Social History  . Marital Status: Married    Spouse Name: N/A  . Number of Children: N/A  . Years of Education: N/A   Occupational History  . Not on file.   Social History Main Topics  . Smoking status: Former Smoker    Types: Cigarettes    Quit date: 12/11/1985  . Smokeless tobacco: Never Used  . Alcohol Use: Yes  . Drug Use: No  . Sexual Activity: Not on file   Other Topics Concern  . Not on file   Social History Narrative   Family history is notable that both parents are deceased. No family history on file.  ROS General: Negative; No fevers, chills, or night sweats HEENT: Negative; No changes in vision or hearing, sinus congestion, difficulty swallowing Pulmonary: Negative; No cough, wheezing, shortness of breath, hemoptysis Cardiovascular: See HPI: No chest pain, presyncope, syncope, palpatations GI: Negative; No nausea, vomiting, diarrhea, or abdominal pain GU: Negative; No dysuria, hematuria, or difficulty voiding Musculoskeletal: Negative; no myalgias, joint pain, or weakness Hematologic: Negative; no easy bruising, bleeding Endocrine: Negative; no heat/cold intolerance; no diabetes, Neuro: Negative; no changes in balance, headaches Skin: Negative; No rashes or skin lesions Psychiatric: Negative; No behavioral problems, depression Sleep: Negative; No snoring,  daytime sleepiness, hypersomnolence, bruxism, restless legs, hypnogognic hallucinations. Other comprehensive 14 point system review is negative   Physical Exam BP 141/66 mmHg  Pulse 55  Ht '5\' 4"'  (1.626 m)  Wt 165 lb 12.8 oz (75.206 kg)  BMI 28.45 kg/m2   Wt Readings from  Last 3 Encounters:  04/27/16 165 lb 12.8 oz (75.206 kg)  09/14/15 169 lb 9.6 oz (76.93 kg)  05/13/15 169 lb (76.658 kg)   General: Alert, oriented, no distress.  Skin: normal turgor, no rashes, warm and dry HEENT: Normocephalic, atraumatic. Pupils equal round and reactive to light; sclera anicteric; extraocular muscles intact, No lid lag; Nose without nasal septal hypertrophy; Mouth/Parynx benign; Mallinpatti scale 3 Neck: No JVD, no carotid bruits; normal carotid upstroke Lungs: clear to ausculatation and percussion bilaterally; no wheezing or rales, normal inspiratory and expiratory effort Chest wall: without tenderness to palpitation Heart: PMI not displaced, RRR, s1 s2 normal, 1/6 systolic murmur, No diastolic murmur, no rubs, gallops, thrills, or heaves Abdomen: soft, nontender; no hepatosplenomehaly, BS+; abdominal aorta nontender and not dilated by palpation. Back: no CVA tenderness Pulses: 2+  Musculoskeletal: full range of motion, normal strength, no joint deformities Extremities: Pulses 2+, no clubbing cyanosis or edema, Homan's sign negative  Neurologic: grossly nonfocal; Cranial nerves grossly wnl Psychologic: Normal mood and affect  ECG (independently read by me): Atrial fibrillation with a reduced ventricular response at 42 bpm.  Her anterior R-wave progression V1 through V4.  Incomplete left bundle branch block with inferolateral T wave abnormality.  October 2016 ECG (independently read by me): Atrial fibrillation at 60 bpm.  Nonspecific interventricular conduction delay.  Old inferior Q waves.  Poor anterior R-wave progression.  QTc interval 442 ms.  June 2016 ECG (independently read by me):  Atrial fibrillation with a controlled ventricular response in the 60s.  Left bundle branch block with repolarization changes.  December 2015ECG (independently read by me): Atrial fibrillation. Ventricular rate averaging 65 bpm. Old inferior infarction. Poor R wave progression. PVC.    June 2015 ECG today (independently read by me): Atrial fibrillation with a controlled ventricular rate at 64 beats per minute. Old Q waves in leads 3 and aVF. Incomplete left bundle branch block.   ECG 04/02/14 (independently read by me): Atrial fibrillation with ventricular rate at 60. Old inferior Q waves in leads 3 and aVF. Incomplete left bundle branch block   Prior 02/18/2014 ECG (independently read by me): Sinus bradycardia 52 beats per minute with a PAC. There also is T wave inversion in leads V4 through V6 as well as lead 2 and 3. There is left axis deviation. In comparison to his prior ECG one year previously in our office his T wave changes in V4 through V6 are more pronounced.    LABS:  BMP Latest Ref Rng 01/21/2014 01/21/2014  Glucose 70 - 99 mg/dL 109(H) 110(H)  BUN 6 - 23 mg/dL 12 13  Creatinine 0.50 - 1.35 mg/dL 0.90 0.80  Sodium 137 - 147 mEq/L 133(L) 131(L)  Potassium 3.7 - 5.3 mEq/L 3.9 3.9  Chloride 96 - 112 mEq/L 94(L) 94(L)  CO2 19 - 32 mEq/L - 25  Calcium 8.4 - 10.5 mg/dL - 9.1    Hepatic Function Latest Ref Rng 01/21/2014  Total Protein 6.0 - 8.3 g/dL 6.6  Albumin 3.5 - 5.2 g/dL 4.0  AST 0 - 37 U/L 24  ALT 0 - 53 U/L 19  Alk Phosphatase 39 - 117 U/L 53  Total Bilirubin 0.3 - 1.2 mg/dL 0.6    CBC Latest Ref Rng 01/21/2014 01/21/2014  WBC 4.0 - 10.5 K/uL - 5.7  Hemoglobin 13.0 - 17.0 g/dL 14.3 13.2  Hematocrit 39.0 - 52.0 % 42.0 37.1(L)  Platelets 150 - 400 K/uL - 191   Lab Results  Component Value Date   MCV 88.5 01/21/2014    No results found for: TSH  BNP No results found for: BNP  ProBNP No results found for: PROBNP   Lipid Panel  No results found for: CHOL, TRIG, HDL, CHOLHDL, VLDL, LDLCALC, LDLDIRECT   RADIOLOGY: No results found.    ASSESSMENT AND PLAN: Mr. Toren is a 81 year old gentleman who is 231/2 years status post inferolateral wall MI treated with PCI to his RCA and stage intervention to his LAD in November 1993. He  developed in-stent restenosis one year later in June 1994 but has been stable with reference to his CAD.  Since that time. His last nuclear study showed inferior scar with an EF of 40%. He has permanent atrial fibrillation.  His ventricular rate today is bradycardic.  He denies any postural symptoms.  He has been on Toprol-XL 125 mg daily for rate control.  I have recommended he reduce this and take 100 mg daily.  With reference to his CAD.  He's not having any recurrent angina also on isosorbide mononitrate 60 mg daily, and amlodipine 5 mg, which he takes in a CAD with combination.  His blood pressure today is stable.  He is tolerating rammer, Electronics engineer.  He is on  atorvastatin for lipid lowering therapy and denies myalgias.  I reviewed his recent neurologic evaluation which was done at [facility] from 02/24/2016.  He will be undergoing evaluation at [facility].  He's not having any anginal symptoms or dyspnea.  There is no presyncope or syncope.  If on his reduced Toprol dose of 100 mg if his resting pulse is below 50.  He will further reduce Toprol to 75 mg daily.  I will see him in 3 months for reevaluation.  Time spent: 25 minutes  Troy Sine, MD, [facility]  04/29/2016 10:29 AM

## 2016-05-04 DIAGNOSIS — I2510 Atherosclerotic heart disease of native coronary artery without angina pectoris: Secondary | ICD-10-CM | POA: Diagnosis not present

## 2016-05-04 DIAGNOSIS — I4891 Unspecified atrial fibrillation: Secondary | ICD-10-CM | POA: Diagnosis not present

## 2016-05-04 DIAGNOSIS — I480 Paroxysmal atrial fibrillation: Secondary | ICD-10-CM | POA: Diagnosis not present

## 2016-05-04 DIAGNOSIS — I10 Essential (primary) hypertension: Secondary | ICD-10-CM | POA: Diagnosis not present

## 2016-05-04 LAB — COMPREHENSIVE METABOLIC PANEL
ALBUMIN: 4.2 g/dL (ref 3.6–5.1)
ALT: 23 U/L (ref 9–46)
AST: 24 U/L (ref 10–35)
Alkaline Phosphatase: 54 U/L (ref 40–115)
BILIRUBIN TOTAL: 1.1 mg/dL (ref 0.2–1.2)
BUN: 10 mg/dL (ref 7–25)
CO2: 25 mmol/L (ref 20–31)
CREATININE: 0.84 mg/dL (ref 0.70–1.18)
Calcium: 8.9 mg/dL (ref 8.6–10.3)
Chloride: 99 mmol/L (ref 98–110)
Glucose, Bld: 88 mg/dL (ref 65–99)
Potassium: 4.1 mmol/L (ref 3.5–5.3)
SODIUM: 134 mmol/L — AB (ref 135–146)
TOTAL PROTEIN: 6.4 g/dL (ref 6.1–8.1)

## 2016-05-04 LAB — CBC
HCT: 43.9 % (ref 38.5–50.0)
HEMOGLOBIN: 14.8 g/dL (ref 13.2–17.1)
MCH: 30.3 pg (ref 27.0–33.0)
MCHC: 33.7 g/dL (ref 32.0–36.0)
MCV: 89.8 fL (ref 80.0–100.0)
MPV: 10.8 fL (ref 7.5–12.5)
Platelets: 215 10*3/uL (ref 140–400)
RBC: 4.89 MIL/uL (ref 4.20–5.80)
RDW: 13.2 % (ref 11.0–15.0)
WBC: 5.6 10*3/uL (ref 3.8–10.8)

## 2016-05-04 LAB — LIPID PANEL
Cholesterol: 151 mg/dL (ref 125–200)
HDL: 84 mg/dL (ref >=40)
LDL CALC: 57 mg/dL (ref <130)
TRIGLYCERIDES: 52 mg/dL (ref <150)
Total CHOL/HDL Ratio: 1.8 Ratio (ref <=5.0)
VLDL: 10 mg/dL (ref <30)

## 2016-05-04 LAB — TSH: TSH: 2.27 m[IU]/L (ref 0.40–4.50)

## 2016-05-09 DIAGNOSIS — Z8673 Personal history of transient ischemic attack (TIA), and cerebral infarction without residual deficits: Secondary | ICD-10-CM | POA: Diagnosis not present

## 2016-05-09 DIAGNOSIS — Z87891 Personal history of nicotine dependence: Secondary | ICD-10-CM | POA: Diagnosis not present

## 2016-05-09 DIAGNOSIS — I4891 Unspecified atrial fibrillation: Secondary | ICD-10-CM | POA: Diagnosis not present

## 2016-05-09 DIAGNOSIS — G301 Alzheimer's disease with late onset: Secondary | ICD-10-CM | POA: Diagnosis not present

## 2016-05-09 DIAGNOSIS — Z9861 Coronary angioplasty status: Secondary | ICD-10-CM | POA: Diagnosis not present

## 2016-05-09 DIAGNOSIS — I252 Old myocardial infarction: Secondary | ICD-10-CM | POA: Diagnosis not present

## 2016-05-09 DIAGNOSIS — I10 Essential (primary) hypertension: Secondary | ICD-10-CM | POA: Diagnosis not present

## 2016-05-09 DIAGNOSIS — I2510 Atherosclerotic heart disease of native coronary artery without angina pectoris: Secondary | ICD-10-CM | POA: Diagnosis not present

## 2016-05-09 DIAGNOSIS — R69 Illness, unspecified: Secondary | ICD-10-CM | POA: Diagnosis not present

## 2016-05-09 DIAGNOSIS — Z7982 Long term (current) use of aspirin: Secondary | ICD-10-CM | POA: Diagnosis not present

## 2016-05-16 ENCOUNTER — Telehealth: Payer: Self-pay | Admitting: Cardiovascular Disease

## 2016-05-16 NOTE — Telephone Encounter (Signed)
Pt is calling back to get the results to labs. Please f/u with her

## 2016-05-16 NOTE — Telephone Encounter (Signed)
   Notes Recorded by Earvin Hansen on 05/16/2016 at 10:27 AM Verbal ok from husband to give information to wife, advised her labs ok per Dr Claiborne Billings   Notes Recorded by Troy Sine, MD on 05/07/2016 at 9:59 PM Labs ok

## 2016-06-15 DIAGNOSIS — Z1389 Encounter for screening for other disorder: Secondary | ICD-10-CM | POA: Diagnosis not present

## 2016-06-15 DIAGNOSIS — I10 Essential (primary) hypertension: Secondary | ICD-10-CM | POA: Diagnosis not present

## 2016-06-15 DIAGNOSIS — Z0000 Encounter for general adult medical examination without abnormal findings: Secondary | ICD-10-CM | POA: Diagnosis not present

## 2016-06-15 DIAGNOSIS — R69 Illness, unspecified: Secondary | ICD-10-CM | POA: Diagnosis not present

## 2016-06-15 DIAGNOSIS — I4891 Unspecified atrial fibrillation: Secondary | ICD-10-CM | POA: Diagnosis not present

## 2016-06-15 DIAGNOSIS — N400 Enlarged prostate without lower urinary tract symptoms: Secondary | ICD-10-CM | POA: Diagnosis not present

## 2016-06-15 DIAGNOSIS — I25119 Atherosclerotic heart disease of native coronary artery with unspecified angina pectoris: Secondary | ICD-10-CM | POA: Diagnosis not present

## 2016-07-20 DIAGNOSIS — H02831 Dermatochalasis of right upper eyelid: Secondary | ICD-10-CM | POA: Diagnosis not present

## 2016-07-20 DIAGNOSIS — H02834 Dermatochalasis of left upper eyelid: Secondary | ICD-10-CM | POA: Diagnosis not present

## 2016-07-20 DIAGNOSIS — Z961 Presence of intraocular lens: Secondary | ICD-10-CM | POA: Diagnosis not present

## 2016-07-20 DIAGNOSIS — H26492 Other secondary cataract, left eye: Secondary | ICD-10-CM | POA: Diagnosis not present

## 2016-07-29 ENCOUNTER — Emergency Department (HOSPITAL_COMMUNITY): Payer: Medicare HMO

## 2016-07-29 ENCOUNTER — Encounter (HOSPITAL_COMMUNITY): Payer: Self-pay | Admitting: Emergency Medicine

## 2016-07-29 ENCOUNTER — Emergency Department (HOSPITAL_COMMUNITY)
Admission: EM | Admit: 2016-07-29 | Discharge: 2016-07-29 | Disposition: A | Discharge status: Home / Self Care | Payer: Medicare HMO | Attending: Emergency Medicine | Admitting: Emergency Medicine

## 2016-07-29 DIAGNOSIS — Z87891 Personal history of nicotine dependence: Secondary | ICD-10-CM | POA: Insufficient documentation

## 2016-07-29 DIAGNOSIS — S098XXA Other specified injuries of head, initial encounter: Secondary | ICD-10-CM | POA: Diagnosis not present

## 2016-07-29 DIAGNOSIS — Y92512 Supermarket, store or market as the place of occurrence of the external cause: Secondary | ICD-10-CM | POA: Diagnosis not present

## 2016-07-29 DIAGNOSIS — S0993XA Unspecified injury of face, initial encounter: Secondary | ICD-10-CM | POA: Diagnosis not present

## 2016-07-29 DIAGNOSIS — S0181XA Laceration without foreign body of other part of head, initial encounter: Secondary | ICD-10-CM | POA: Diagnosis not present

## 2016-07-29 DIAGNOSIS — I2510 Atherosclerotic heart disease of native coronary artery without angina pectoris: Secondary | ICD-10-CM | POA: Diagnosis not present

## 2016-07-29 DIAGNOSIS — S0990XA Unspecified injury of head, initial encounter: Secondary | ICD-10-CM | POA: Diagnosis not present

## 2016-07-29 DIAGNOSIS — I10 Essential (primary) hypertension: Secondary | ICD-10-CM | POA: Diagnosis not present

## 2016-07-29 DIAGNOSIS — I252 Old myocardial infarction: Secondary | ICD-10-CM | POA: Insufficient documentation

## 2016-07-29 DIAGNOSIS — R55 Syncope and collapse: Secondary | ICD-10-CM

## 2016-07-29 DIAGNOSIS — Z7982 Long term (current) use of aspirin: Secondary | ICD-10-CM | POA: Diagnosis not present

## 2016-07-29 DIAGNOSIS — Y939 Activity, unspecified: Secondary | ICD-10-CM | POA: Insufficient documentation

## 2016-07-29 DIAGNOSIS — Y999 Unspecified external cause status: Secondary | ICD-10-CM | POA: Diagnosis not present

## 2016-07-29 DIAGNOSIS — S01112A Laceration without foreign body of left eyelid and periocular area, initial encounter: Secondary | ICD-10-CM | POA: Diagnosis not present

## 2016-07-29 DIAGNOSIS — W1830XA Fall on same level, unspecified, initial encounter: Secondary | ICD-10-CM | POA: Insufficient documentation

## 2016-07-29 DIAGNOSIS — Z23 Encounter for immunization: Secondary | ICD-10-CM | POA: Diagnosis not present

## 2016-07-29 LAB — CBG MONITORING, ED: Glucose-Capillary: 94 mg/dL (ref 65–99)

## 2016-07-29 LAB — BASIC METABOLIC PANEL
ANION GAP: 9 (ref 5–15)
BUN: 11 mg/dL (ref 6–20)
CALCIUM: 8.9 mg/dL (ref 8.9–10.3)
CO2: 24 mmol/L (ref 22–32)
Chloride: 97 mmol/L — ABNORMAL LOW (ref 101–111)
Creatinine, Ser: 0.82 mg/dL (ref 0.61–1.24)
GFR calc Af Amer: >60 mL/min (ref >60)
GFR calc non Af Amer: >60 mL/min (ref >60)
GLUCOSE: 96 mg/dL (ref 65–99)
Potassium: 3.4 mmol/L — ABNORMAL LOW (ref 3.5–5.1)
Sodium: 130 mmol/L — ABNORMAL LOW (ref 135–145)

## 2016-07-29 LAB — CBC WITH DIFFERENTIAL/PLATELET
BASOS ABS: 0 10*3/uL (ref 0.0–0.1)
Basophils Relative: 0 %
Eosinophils Absolute: 0.1 10*3/uL (ref 0.0–0.7)
Eosinophils Relative: 2 %
HEMATOCRIT: 40.6 % (ref 39.0–52.0)
Hemoglobin: 13.9 g/dL (ref 13.0–17.0)
LYMPHS ABS: 2.1 10*3/uL (ref 0.7–4.0)
LYMPHS PCT: 29 %
MCH: 31 pg (ref 26.0–34.0)
MCHC: 34.2 g/dL (ref 30.0–36.0)
MCV: 90.6 fL (ref 78.0–100.0)
MONO ABS: 0.6 10*3/uL (ref 0.1–1.0)
MONOS PCT: 8 %
NEUTROS ABS: 4.4 10*3/uL (ref 1.7–7.7)
Neutrophils Relative %: 61 %
Platelets: 203 10*3/uL (ref 150–400)
RBC: 4.48 MIL/uL (ref 4.22–5.81)
RDW: 13.1 % (ref 11.5–15.5)
WBC: 7.3 10*3/uL (ref 4.0–10.5)

## 2016-07-29 MED ORDER — LIDOCAINE-EPINEPHRINE 1 %-1:100000 IJ SOLN
20.0000 mL | Freq: Once | INTRAMUSCULAR | Status: AC
  Administered 2016-07-29: 1 mL via INTRADERMAL
  Filled 2016-07-29: qty 1

## 2016-07-29 MED ORDER — TETANUS-DIPHTH-ACELL PERTUSSIS 5-2.5-18.5 LF-MCG/0.5 IM SUSP
0.5000 mL | Freq: Once | INTRAMUSCULAR | Status: AC
  Administered 2016-07-29: 0.5 mL via INTRAMUSCULAR
  Filled 2016-07-29: qty 0.5

## 2016-07-29 MED ORDER — NAPROXEN 250 MG PO TABS
500.0000 mg | ORAL_TABLET | Freq: Once | ORAL | Status: AC
  Administered 2016-07-29: 500 mg via ORAL
  Filled 2016-07-29: qty 2

## 2016-07-29 MED ORDER — ACETAMINOPHEN 500 MG PO TABS
1000.0000 mg | ORAL_TABLET | Freq: Once | ORAL | Status: DC
  Filled 2016-07-29: qty 2

## 2016-07-29 NOTE — ED Provider Notes (Signed)
[facility] DEPT Provider Note   CSN: [id number] Arrival date & time: 07/29/16  2133     History   Chief Complaint Chief Complaint  Patient presents with  . Near Syncope    HPI Joseph Winters is a 81 y.o. male.  81 yo M with a chief complaint of a syncopal event. The patient was standing next to the car and a shopping cart rolled by, he lost his balance and fell. He has a history of dementia and does not remember the event. Level V caveat dementia. Is complaining mostly of left-sided head pain. Denies neck pain denies chest pain abdominal pain back pain. Denies extremity pain. Having a bit of a headache but denies other complaints. Denies any presyncopal feeling.   The history is provided by the patient.  Near Syncope  This is a new problem. The current episode started 1 to 2 hours ago. The problem occurs constantly. The problem has not changed since onset.Pertinent negatives include no chest pain, no abdominal pain, no headaches and no shortness of breath. Nothing aggravates the symptoms. Nothing relieves the symptoms. He has tried nothing for the symptoms. The treatment provided no relief.    Past Medical History:  Diagnosis Date  . Coronary artery disease   . Dysrhythmia 2009   24hr holter-PVC'S  . Hypertension 08/28/2008   Echo EF 45-50%  . Myocardial infarction [facility]) 1993    Patient Active Problem List   Diagnosis Date Noted  . Chronic atrial fibrillation ([place]) 11/15/2014  . Subdural hematoma ([place]) 04/04/2014  . PAF (paroxysmal atrial fibrillation), new from 01/2014 rate controlled.  03/06/2014  . Old MI (myocardial infarction) 03/01/2014  . CAD (coronary artery disease) 03/01/2014  . HTN (hypertension) 03/01/2014  . Hyperlipidemia with target LDL less than 70 03/01/2014  . Closed head injury 01/21/2014    Past Surgical History:  Procedure Laterality Date  . CORONARY ANGIOPLASTY     initial inferior MI [place] Repeat PTCA       Home Medications     Prior to Admission medications   Medication Sig Start Date End Date Taking? Authorizing Provider  amLODipine-atorvastatin (CADUET) 5-10 MG tablet Take 1 tablet by mouth daily. 01/25/16  Yes Troy Sine, MD  aspirin 81 MG tablet Take 81 mg by mouth daily.   Yes Historical Provider, MD  donepezil (ARICEPT) 10 MG tablet Take 10 mg by mouth at bedtime. 05/09/16 08/07/16 Yes Historical Provider, MD  doxazosin (CARDURA) 1 MG tablet Take 1 tablet (1 mg total) by mouth daily. 01/25/16  Yes Troy Sine, MD  isosorbide mononitrate (IMDUR) 60 MG 24 hr tablet Take 1 tablet (60 mg total) by mouth daily. 01/25/16  Yes Troy Sine, MD  metoprolol succinate (TOPROL-XL) 50 MG 24 hr tablet Take 2.tablets (100mg ) by mouth daily 04/27/16  Yes Troy Sine, MD  Multiple Vitamins-Minerals (OCUVITE ADULT FORMULA PO) Take 1 capsule by mouth daily.   Yes Historical Provider, MD  ramipril (ALTACE) 10 MG capsule Take 1 capsule (10 mg total) by mouth daily. 01/25/16  Yes Troy Sine, MD    Family History No family history on file.  Social History Social History  Substance Use Topics  . Smoking status: Former Smoker    Types: Cigarettes    Quit date: 12/11/1985  . Smokeless tobacco: Never Used  . Alcohol use Yes     Allergies   Review of patient's allergies indicates no known allergies.   Review of Systems Review of Systems  Constitutional: Negative for chills and fever.  HENT: Negative for congestion and facial swelling.   Eyes: Negative for discharge and visual disturbance.  Respiratory: Negative for shortness of breath.   Cardiovascular: Positive for near-syncope. Negative for chest pain and palpitations.  Gastrointestinal: Negative for abdominal pain, diarrhea and vomiting.  Musculoskeletal: Negative for arthralgias and myalgias.  Skin: Negative for color change and rash.  Neurological: Positive for syncope. Negative for tremors and headaches.  Psychiatric/Behavioral: Negative for confusion  and dysphoric mood.     Physical Exam Updated Vital Signs BP 127/61   Pulse (!) 58   Temp 97.4 F (36.3 C) (Oral)   Resp 14   SpO2 97%   Physical Exam  Constitutional: He is oriented to person, place, and time. He appears well-developed and well-nourished.  HENT:  Head: Normocephalic.  5 cm laceration to the left eyebrow jagged, just above the glea, facial nerve appears to be intact. Extraocular motion intact.  Eyes: Conjunctivae and EOM are normal. Pupils are equal, round, and reactive to light.  Neck: Normal range of motion. No JVD present.  Cardiovascular: Normal rate and regular rhythm.   Pulmonary/Chest: Effort normal. No stridor. No respiratory distress.  Abdominal: He exhibits no distension. There is no tenderness. There is no guarding.  Musculoskeletal: Normal range of motion. He exhibits no edema.  Neurological: He is alert and oriented to person, place, and time.  Skin: Skin is warm and dry.  Psychiatric: He has a normal mood and affect. His behavior is normal.     ED Treatments / Results  Labs (all labs ordered are listed, but only abnormal results are displayed) Labs Reviewed  BASIC METABOLIC PANEL - Abnormal; Notable for the following:       Result Value   Sodium 130 (*)    Potassium 3.4 (*)    Chloride 97 (*)    All other components within normal limits  CBC WITH DIFFERENTIAL/PLATELET  CBG MONITORING, ED    EKG  EKG Interpretation  Date/Time:  Saturday July 29 2016 21:41:30 EDT Ventricular Rate:  56 PR Interval:    QRS Duration: 143 QT Interval:  546 QTC Calculation: 527 R Axis:   111 Text Interpretation:  Normal sinus rhythm Nonspecific intraventricular conduction delay Confirmed by Jermario Kalmar MD, DANIEL [phone]) on 07/29/2016 9:50:53 PM       Radiology Ct Head Wo Contrast  Result Date: 07/29/2016 CLINICAL DATA:  Fall with frontal and left face injury. Initial encounter. EXAM: CT HEAD WITHOUT CONTRAST CT MAXILLOFACIAL WITHOUT CONTRAST TECHNIQUE:  Multidetector CT imaging of the head and maxillofacial structures were performed using the standard protocol without intravenous contrast. Multiplanar CT image reconstructions of the maxillofacial structures were also generated. COMPARISON:  04/28/2014 FINDINGS: CT HEAD FINDINGS Brain: No evidence of acute infarction, hemorrhage, hydrocephalus, or mass lesion/mass effect. Subdural collection seen previously is resolved. Atrophy, central predominant, with ventriculomegaly. Mild for age small-vessel ischemic changes in the cerebral white matter. Small area of gliosis in the left inferior temporal gyrus which could be post traumatic or ischemic. Vascular:  Atherosclerotic calcification. Skull: Left forehead contusion and laceration without fracture. Right parietal scalp contusion. Sinuses/Orbits: See below CT MAXILLOFACIAL FINDINGS Osseous: Degraded evaluation of the mandible due to motion. No visible fracture or mandibular dislocation. No destructive process. Orbits: Bilateral cataract resection. No acute or posttraumatic finding. Sinuses: No hemo sinus Soft tissues: Left supraorbital laceration and swelling. Intravenous gas attributed IV access. IMPRESSION: 1. Negative for intracranial injury. 2. Scalp and facial soft tissue injury without facial  fracture. 3. Moderate generalized atrophy. Electronically Signed   By: Monte Fantasia M.D.   On: 07/29/2016 23:03   Ct Maxillofacial Wo Contrast  Result Date: 07/29/2016 CLINICAL DATA:  Fall with frontal and left face injury. Initial encounter. EXAM: CT HEAD WITHOUT CONTRAST CT MAXILLOFACIAL WITHOUT CONTRAST TECHNIQUE: Multidetector CT imaging of the head and maxillofacial structures were performed using the standard protocol without intravenous contrast. Multiplanar CT image reconstructions of the maxillofacial structures were also generated. COMPARISON:  04/28/2014 FINDINGS: CT HEAD FINDINGS Brain: No evidence of acute infarction, hemorrhage, hydrocephalus, or mass  lesion/mass effect. Subdural collection seen previously is resolved. Atrophy, central predominant, with ventriculomegaly. Mild for age small-vessel ischemic changes in the cerebral white matter. Small area of gliosis in the left inferior temporal gyrus which could be post traumatic or ischemic. Vascular:  Atherosclerotic calcification. Skull: Left forehead contusion and laceration without fracture. Right parietal scalp contusion. Sinuses/Orbits: See below CT MAXILLOFACIAL FINDINGS Osseous: Degraded evaluation of the mandible due to motion. No visible fracture or mandibular dislocation. No destructive process. Orbits: Bilateral cataract resection. No acute or posttraumatic finding. Sinuses: No hemo sinus Soft tissues: Left supraorbital laceration and swelling. Intravenous gas attributed IV access. IMPRESSION: 1. Negative for intracranial injury. 2. Scalp and facial soft tissue injury without facial fracture. 3. Moderate generalized atrophy. Electronically Signed   By: Monte Fantasia M.D.   On: 07/29/2016 23:03    Procedures .Marland KitchenLaceration Repair Date/Time: 07/29/2016 11:44 PM Performed by: Tyrone Nine Daniel Ritthaler Authorized by: Deno Etienne   Consent:    Consent obtained:  Verbal   Consent given by:  Patient   Risks discussed:  Infection, pain, poor cosmetic result, need for additional repair, nerve damage, poor wound healing, vascular damage, tendon damage and retained foreign body   Alternatives discussed:  No treatment Anesthesia (see MAR for exact dosages):    Anesthesia method:  Local infiltration   Local anesthetic:  Lidocaine 1% WITH epi Laceration details:    Location:  Face   Face location:  L eyebrow   Length (cm):  5 Repair type:    Repair type:  Complex Pre-procedure details:    Preparation:  Patient was prepped and draped in usual sterile fashion Exploration:    Limited defect created (wound extended): no     Contaminated: no   Treatment:    Wound cleansed with: chlorahexidine.   Amount of  cleaning:  Extensive   Irrigation solution:  Sterile saline   Irrigation volume:  500   Irrigation method:  Pressure wash   Visualized foreign bodies/material removed: no     Debridement:  Minimal   Undermining:  Minimal   Scar revision: no   Skin repair:    Repair method:  Sutures   Suture size:  4-0   Suture material:  Prolene   Number of sutures:  5 Approximation:    Approximation:  Close Post-procedure details:    Dressing:  Adhesive bandage   Patient tolerance of procedure:  Tolerated well, no immediate complications   (including critical care time)  Medications Ordered in ED Medications  acetaminophen (TYLENOL) tablet 1,000 mg (1,000 mg Oral Refused 07/29/16 2204)  Tdap (BOOSTRIX) injection 0.5 mL (0.5 mLs Intramuscular Given 07/29/16 2205)  naproxen (NAPROSYN) tablet 500 mg (500 mg Oral Given 07/29/16 2207)  lidocaine-EPINEPHrine (XYLOCAINE W/EPI) 1 %-1:100000 (with pres) injection 20 mL (1 mL Intradermal Given 07/29/16 2205)     Initial Impression / Assessment and Plan / ED Course  I have reviewed the triage vital signs and the  nursing notes.  Pertinent labs & imaging results that were available during my care of the patient were reviewed by me and considered in my medical decision making (see chart for details).  Clinical Course    81 yo M With a chief complaints of a fall. Unsure of the etiology but sounds like he lost his balance trying to stop shopping cart. Laboratory evaluation reassuring. EKG with no significant changes no Wolff-Parkinson-White: QT or Brugada syndrome. Family at bedside on my reassessment. They're concerned about bradycardia just after the event. Patient with no significant arrhythmias while in the ED. Discussed follow-up with cardiology.  11:49 PM:  I have discussed the diagnosis/risks/treatment options with the patient and family and believe the pt to be eligible for discharge home to follow-up with PCP, cards. We also discussed returning to the  ED immediately if new or worsening sx occur. We discussed the sx which are most concerning (e.g., sudden worsening pain, fever, inability to tolerate by mouth) that necessitate immediate return. Medications administered to the patient during their visit and any new prescriptions provided to the patient are listed below.  Medications given during this visit Medications  acetaminophen (TYLENOL) tablet 1,000 mg (1,000 mg Oral Refused 07/29/16 2204)  Tdap (BOOSTRIX) injection 0.5 mL (0.5 mLs Intramuscular Given 07/29/16 2205)  naproxen (NAPROSYN) tablet 500 mg (500 mg Oral Given 07/29/16 2207)  lidocaine-EPINEPHrine (XYLOCAINE W/EPI) 1 %-1:100000 (with pres) injection 20 mL (1 mL Intradermal Given 07/29/16 2205)     The patient appears reasonably screen and/or stabilized for discharge and I doubt any other medical condition or other [facility] requiring further screening, evaluation, or treatment in the ED at this time prior to discharge.    Final Clinical Impressions(s) / ED Diagnoses   Final diagnoses:  Near syncope    New Prescriptions New Prescriptions   No medications on file     Deno Etienne, DO 07/29/16 2349

## 2016-07-29 NOTE — ED Notes (Signed)
MD at bedside. 

## 2016-07-29 NOTE — ED Triage Notes (Signed)
Pt was shopping at [employer] and fell. Pt has a laceration on right forehead. EMS arrived pt was brady at 35 and hypotensive. 250 of fluid given in route. NO LOC.

## 2016-07-29 NOTE — ED Notes (Signed)
Pt to CT

## 2016-07-30 NOTE — ED Notes (Signed)
Pt d/c home via w/c with family.

## 2016-07-31 ENCOUNTER — Encounter: Payer: Self-pay | Admitting: Cardiovascular Disease

## 2016-08-22 ENCOUNTER — Encounter: Payer: Self-pay | Admitting: Cardiovascular Disease

## 2016-08-22 ENCOUNTER — Ambulatory Visit (INDEPENDENT_AMBULATORY_CARE_PROVIDER_SITE_OTHER): Payer: Medicare HMO | Admitting: Cardiovascular Disease

## 2016-08-22 VITALS — BP 142/72 | HR 51 | Ht 66.0 in | Wt 174.0 lb

## 2016-08-22 DIAGNOSIS — R001 Bradycardia, unspecified: Secondary | ICD-10-CM | POA: Diagnosis not present

## 2016-08-22 DIAGNOSIS — Z7982 Long term (current) use of aspirin: Secondary | ICD-10-CM | POA: Diagnosis not present

## 2016-08-22 DIAGNOSIS — I482 Chronic atrial fibrillation, unspecified: Secondary | ICD-10-CM

## 2016-08-22 DIAGNOSIS — Z79899 Other long term (current) drug therapy: Secondary | ICD-10-CM | POA: Diagnosis not present

## 2016-08-22 DIAGNOSIS — I252 Old myocardial infarction: Secondary | ICD-10-CM

## 2016-08-22 DIAGNOSIS — H02402 Unspecified ptosis of left eyelid: Secondary | ICD-10-CM | POA: Diagnosis not present

## 2016-08-22 DIAGNOSIS — I10 Essential (primary) hypertension: Secondary | ICD-10-CM

## 2016-08-22 DIAGNOSIS — I481 Persistent atrial fibrillation: Secondary | ICD-10-CM | POA: Diagnosis not present

## 2016-08-22 DIAGNOSIS — I2510 Atherosclerotic heart disease of native coronary artery without angina pectoris: Secondary | ICD-10-CM

## 2016-08-22 DIAGNOSIS — G301 Alzheimer's disease with late onset: Secondary | ICD-10-CM | POA: Diagnosis not present

## 2016-08-22 DIAGNOSIS — R69 Illness, unspecified: Secondary | ICD-10-CM | POA: Diagnosis not present

## 2016-08-22 DIAGNOSIS — Z9181 History of falling: Secondary | ICD-10-CM | POA: Diagnosis not present

## 2016-08-22 DIAGNOSIS — I2583 Coronary atherosclerosis due to lipid rich plaque: Secondary | ICD-10-CM

## 2016-08-22 MED ORDER — METOPROLOL SUCCINATE ER 50 MG PO TB24: ORAL_TABLET | ORAL | 3 refills | Status: DC

## 2016-08-22 NOTE — Patient Instructions (Signed)
Medication Instructions:   DECREASE METOPROLOL TO 75 MG ONCE DAILY= 1 AND 1/2 TABLETS ONCE DAILY  Follow-Up:  Your physician wants you to follow-up in: [place] will receive a reminder letter in the mail two months in advance. If you don't receive a letter, please call our office to schedule the follow-up appointment.   If you need a refill on your cardiac medications before your next appointment, please call your pharmacy.

## 2016-08-24 NOTE — Progress Notes (Signed)
Patient ID: [name], male   DOB: 04/08/1935, 81 y.o.   MRN: [id number]    PCP: Joseph Winters  HPI: Joseph Winters is a 81 y.o. male who presents to the office today for a 4 month follow up cardiology evaluation.  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 Due to concomitant CAD, he underwent staged intervention to his LAD several days later. His last intervention was in June 1994 for in-stent restenosis at both sites and he underwent repeat PTCA.  Over the past 22 years, he has been stable on aggressive medical therapy. A nuclear perfusion study in September 2013 was unchanged from previously and showed moderate to severe inferior scar without associated ischemia and his ejection fraction post stress was 43%. He has a history of hypertension as well as hyperlipidemia.   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 He was hospitalized and was found to have subarachnoid hemorrhage and small contusion. He did experience some confusion initially which resolved. He has seen Joseph Winters from neurosurgery. 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 There is a small volume of scattered subarachnoid hemorrhage, small left middle cranial fossa subdural hemorrhage, and probable left temporal lobe hemorrhagic contusion with an associated nondisplaced right occipital and parietal skull fracture.  When I saw him in March 2015 he had noted some forgetfulness and some confusion intermittently. He underwent another CT scan which suggested acute to subacute on chronic right subdural hematoma with mild mass effect which was new. He did not have significant midline shift. There is resolution of prior parenchymal contusions and subarachnoid hemorrhage on the left. He did see JosephJones in followup and a head CT demonstrated improvement.   In March 2015, he was found to  be in atrial fibrillation when he presented for his echo Doppler study. Ejection fraction was 35-40%. A nuclear perfusion study showed inferior akinesis, and an ejection fraction of 40% with prior fixed, inferior, inferoseptal scar, without associated ischemia. Due to his prior CNS bleed, he is not a candidate for anticoagulation. His atrial fibrillation rate has been controlled with Toprol-XL 125 mg. I reduced his aspirin to 81 mg daily. He has been on  Toprol-XL 125 g, doxazocin 2 mg, Caduet 5/10 and ramipril10 mg for hypertension. He is tolerated the atorvastatin in Caduet with reference to his hyperlipidemia.  He has been undergoing cognitive rehabilitation. He was reevaluated at [facility] by his neurologist.  He will also be undergoing further evaluation at [facility] memory clinic.  Since I last saw him, he had fallen while at [employer].  His wife had gone to get the car and at that time he was holding onto the carriage which then dropped off the curb, he lost his balance and fell.  He was evaluated in the emergency room on 07/29/2016 and  required 5 sutures .  The laceration of the left eyebrow.  CT scan was negative for intracranial injury.  He denied any associated chest pain or dizziness prior to losing his balance when the cart had fallen off the curb.  He presents now for evaluation.  Past Medical History:  Diagnosis Date  . Coronary artery disease   . Dysrhythmia 2009   24hr holter-PVC'S  . Hypertension 08/28/2008   Echo EF 45-50%  . Myocardial infarction ([place]) 1993    Past Surgical History:  Procedure Laterality Date  . CORONARY ANGIOPLASTY  initial inferior MI 1993. 1994 Repeat PTCA    No Known Allergies  Current Outpatient Prescriptions  Medication Sig Dispense Refill  . amLODipine-atorvastatin (CADUET) 5-10 MG tablet Take 1 tablet by mouth daily. 90 tablet 3  . aspirin 81 MG tablet Take 81 mg by mouth daily.    Marland Kitchen doxazosin (CARDURA) 1 MG tablet Take 1 tablet (1 mg  total) by mouth daily. 90 tablet 3  . isosorbide mononitrate (IMDUR) 60 MG 24 hr tablet Take 1 tablet (60 mg total) by mouth daily. 90 tablet 3  . metoprolol succinate (TOPROL-XL) 50 MG 24 hr tablet TAKE 1 AND 1/2 TABLETS ONCE DAILY 225 tablet 3  . Multiple Vitamins-Minerals (OCUVITE ADULT FORMULA PO) Take 1 capsule by mouth daily.    . ramipril (ALTACE) 10 MG capsule Take 1 capsule (10 mg total) by mouth daily. 90 capsule 3  . donepezil (ARICEPT) 10 MG tablet Take 10 mg by mouth at bedtime.     No current facility-administered medications for this visit.     Social History   Social History  . Marital status: Married    Spouse name: N/A  . Number of children: N/A  . Years of education: N/A   Occupational History  . Not on file.   Social History Main Topics  . Smoking status: Former Smoker    Types: Cigarettes    Quit date: 12/11/1985  . Smokeless tobacco: Never Used  . Alcohol use Yes  . Drug use: No  . Sexual activity: Not on file   Other Topics Concern  . Not on file   Social History Narrative  . No narrative on file   Family history is notable that both parents are deceased. History reviewed. No pertinent family history.  ROS General: Negative; No fevers, chills, or night sweats HEENT: Negative; No changes in vision or hearing, sinus congestion, difficulty swallowing Pulmonary: Negative; No cough, wheezing, shortness of breath, hemoptysis Cardiovascular: See HPI:  GI: Negative; No nausea, vomiting, diarrhea, or abdominal pain GU: Negative; No dysuria, hematuria, or difficulty voiding Musculoskeletal: Negative; no myalgias, joint pain, or weakness Hematologic: Negative; no easy bruising, bleeding Endocrine: Negative; no heat/cold intolerance; no diabetes, Neuro: Negative; no changes in balance, headaches Skin: Negative; No rashes or skin lesions Psychiatric: Negative; No behavioral problems, depression Sleep: Negative; No snoring,  daytime sleepiness,  hypersomnolence, bruxism, restless legs, hypnogognic hallucinations. Other comprehensive 14 point system review is negative   Physical Exam BP (!) 142/72   Pulse (!) 51   Ht 5' 6" (1.676 m)   Wt 174 lb (78.9 kg)   BMI 28.08 kg/m    The blood pressure by me was 132/68 supine and was 130/70 standing.  Wt Readings from Last 3 Encounters:  08/22/16 174 lb (78.9 kg)  04/27/16 165 lb 12.8 oz (75.2 kg)  09/14/15 169 lb 9.6 oz (76.9 kg)   General: Alert, oriented, no distress.  Skin: normal turgor, no rashes, warm and dry HEENT: Normocephalic, atraumatic. Pupils equal round and reactive to light; sclera anicteric; extraocular muscles intact, No lid lag; Nose without nasal septal hypertrophy; Mouth/Parynx benign; Mallinpatti scale 3 Neck: No JVD, no carotid bruits; normal carotid upstroke Lungs: clear to ausculatation and percussion bilaterally; no wheezing or rales, normal inspiratory and expiratory effort Chest wall: without tenderness to palpitation Heart: PMI not displaced, RRR, s1 s2 normal, 1/6 systolic murmur, No diastolic murmur, no rubs, gallops, thrills, or heaves Abdomen: soft, nontender; no hepatosplenomehaly, BS+; abdominal aorta nontender and not dilated by palpation. Back:  no CVA tenderness Pulses: 2+  Musculoskeletal: full range of motion, normal strength, no joint deformities Extremities: Pulses 2+, no clubbing cyanosis or edema, Homan's sign negative  Neurologic: grossly nonfocal; Cranial nerves grossly wnl Psychologic: Normal mood and affect  ECG (independently read by me): Atrial fibrillation with ventricular response at 51 bpm.  Nonspecific interventricular block.  Poor anterior R-wave progression.  Inferior Q waves.  May 2017 ECG (independently read by me): Atrial fibrillation with a reduced ventricular response at 42 bpm.  Her anterior R-wave progression V1 through V4.  Incomplete left bundle branch block with inferolateral T wave abnormality.  October 2016 ECG  (independently read by me): Atrial fibrillation at 60 bpm.  Nonspecific interventricular conduction delay.  Old inferior Q waves.  Poor anterior R-wave progression.  QTc interval 442 ms.  June 2016 ECG (independently read by me):  Atrial fibrillation with a controlled ventricular response in the 60s.  Left bundle branch block with repolarization changes.  December 2015ECG (independently read by me): Atrial fibrillation. Ventricular rate averaging 65 bpm. Old inferior infarction. Poor R wave progression. PVC.   June 2015 ECG today (independently read by me): Atrial fibrillation with a controlled ventricular rate at 64 beats per minute. Old Q waves in leads 3 and aVF. Incomplete left bundle branch block.   ECG 04/02/14 (independently read by me): Atrial fibrillation with ventricular rate at 60. Old inferior Q waves in leads 3 and aVF. Incomplete left bundle branch block   Prior 02/18/2014 ECG (independently read by me): Sinus bradycardia 52 beats per minute with a PAC. There also is T wave inversion in leads V4 through V6 as well as lead 2 and 3. There is left axis deviation. In comparison to his prior ECG one year previously in our office his T wave changes in V4 through V6 are more pronounced.    LABS:  BMP Latest Ref Rng & Units 07/29/2016 05/04/2016 01/21/2014  Glucose 65 - 99 mg/dL 96 88 109(H)  BUN 6 - 20 mg/dL _0 Creatinine 0.61 - 1.24 mg/dL 0.82 0.84 0.90  Sodium 135 - 145 mmol/L 130(L) 134(L) 133(L)  Potassium 3.5 - 5.1 mmol/L 3.4(L) 4.1 3.9  Chloride 101 - 111 mmol/L 97(L) 99 94(L)  CO2 22 - 32 mmol/L 24 25 -  Calcium 8.9 - 10.3 mg/dL 8.9 8.9 -    Hepatic Function Latest Ref Rng & Units 05/04/2016 01/21/2014  Total Protein 6.1 - 8.1 g/dL 6.4 6.6  Albumin 3.6 - 5.1 g/dL 4.2 4.0  AST 10 - 35 U/L 24 24  ALT 9 - 46 U/L 23 19  Alk Phosphatase 40 - 115 U/L 54 53  Total Bilirubin 0.2 - 1.2 mg/dL 1.1 0.6    CBC Latest Ref Rng & Units 07/29/2016 05/04/2016 01/21/2014  WBC 4.0 - 10.5  K/uL 7.3 5.6 -  Hemoglobin 13.0 - 17.0 g/dL 13.9 14.8 14.3  Hematocrit 39.0 - 52.0 % 40.6 43.9 42.0  Platelets 150 - 400 K/uL 203 215 -   Lab Results  Component Value Date   MCV 90.6 07/29/2016   MCV 89.8 05/04/2016   MCV 88.5 01/21/2014    Lab Results  Component Value Date   TSH 2.27 05/04/2016    BNP No results found for: BNP  ProBNP No results found for: PROBNP   Lipid Panel     Component Value Date/Time   CHOL 151 05/04/2016 1020   TRIG 52 05/04/2016 1020   HDL 84 05/04/2016 1020   CHOLHDL 1.8 05/04/2016  1020   VLDL 10 05/04/2016 1020   LDLCALC 57 05/04/2016 1020     RADIOLOGY: Ct Head Wo Contrast  Result Date: 07/29/2016 CLINICAL DATA:  Fall with frontal and left face injury. Initial encounter. EXAM: CT HEAD WITHOUT CONTRAST CT MAXILLOFACIAL WITHOUT CONTRAST TECHNIQUE: Multidetector CT imaging of the head and maxillofacial structures were performed using the standard protocol without intravenous contrast. Multiplanar CT image reconstructions of the maxillofacial structures were also generated. COMPARISON:  04/28/2014 FINDINGS: CT HEAD FINDINGS Brain: No evidence of acute infarction, hemorrhage, hydrocephalus, or mass lesion/mass effect. Subdural collection seen previously is resolved. Atrophy, central predominant, with ventriculomegaly. Mild for age small-vessel ischemic changes in the cerebral white matter. Small area of gliosis in the left inferior temporal gyrus which could be post traumatic or ischemic. Vascular:  Atherosclerotic calcification. Skull: Left forehead contusion and laceration without fracture. Right parietal scalp contusion. Sinuses/Orbits: See below CT MAXILLOFACIAL FINDINGS Osseous: Degraded evaluation of the mandible due to motion. No visible fracture or mandibular dislocation. No destructive process. Orbits: Bilateral cataract resection. No acute or posttraumatic finding. Sinuses: No hemo sinus Soft tissues: Left supraorbital laceration and swelling.  Intravenous gas attributed IV access. IMPRESSION: 1. Negative for intracranial injury. 2. Scalp and facial soft tissue injury without facial fracture. 3. Moderate generalized atrophy. Electronically Signed   By: Monte Fantasia M.D.   On: 07/29/2016 23:03   Ct Maxillofacial Wo Contrast  Result Date: 07/29/2016 CLINICAL DATA:  Fall with frontal and left face injury. Initial encounter. EXAM: CT HEAD WITHOUT CONTRAST CT MAXILLOFACIAL WITHOUT CONTRAST TECHNIQUE: Multidetector CT imaging of the head and maxillofacial structures were performed using the standard protocol without intravenous contrast. Multiplanar CT image reconstructions of the maxillofacial structures were also generated. COMPARISON:  04/28/2014 FINDINGS: CT HEAD FINDINGS Brain: No evidence of acute infarction, hemorrhage, hydrocephalus, or mass lesion/mass effect. Subdural collection seen previously is resolved. Atrophy, central predominant, with ventriculomegaly. Mild for age small-vessel ischemic changes in the cerebral white matter. Small area of gliosis in the left inferior temporal gyrus which could be post traumatic or ischemic. Vascular:  Atherosclerotic calcification. Skull: Left forehead contusion and laceration without fracture. Right parietal scalp contusion. Sinuses/Orbits: See below CT MAXILLOFACIAL FINDINGS Osseous: Degraded evaluation of the mandible due to motion. No visible fracture or mandibular dislocation. No destructive process. Orbits: Bilateral cataract resection. No acute or posttraumatic finding. Sinuses: No hemo sinus Soft tissues: Left supraorbital laceration and swelling. Intravenous gas attributed IV access. IMPRESSION: 1. Negative for intracranial injury. 2. Scalp and facial soft tissue injury without facial fracture. 3. Moderate generalized atrophy. Electronically Signed   By: Monte Fantasia M.D.   On: 07/29/2016 23:03      ASSESSMENT AND PLAN: Mr. Scurlock is a 81 year old gentleman who is almost 24 years status  post inferolateral wall MI treated with PCI to his RCA and stage intervention to his LAD in November 1993. He developed in-stent restenosis one year later in June 1994 but has been stable with reference to his CAD.  His last nuclear study showed inferior scar with an EF of 40%. He has permanent atrial fibrillation.  His ventricular rate today is bradycardic.  He denies any postural symptoms.  I last saw him, I reduced his Toprol to 100 mg.  He continues to be bradycardic on exam today with a ventricular rate in the low 50s.  I have recommended further reduction of Toprol to 75 mg daily.  I reviewed his emergency room evaluation following his loss of balance while at [employer] in 07/29/2016.  Apparently numerous witnesses corroborate that this seemed to be more of a loss of balance when the cart that he was holding onto fell off the curb leading to his fall and head trauma.  Fortunately, he did not have any orbital fractures and was not on long-term anticoagulation.  He continues to undergo cognitive evaluation at [facility].  I reviewed recent blood work.  I will see him in 6 months for reevaluation.  Time spent: 25 minutes  Troy Sine, MD, [facility]  08/24/2016 4:13 PM

## 2016-09-12 DIAGNOSIS — Z23 Encounter for immunization: Secondary | ICD-10-CM | POA: Diagnosis not present

## 2016-12-19 DIAGNOSIS — R69 Illness, unspecified: Secondary | ICD-10-CM | POA: Diagnosis not present

## 2016-12-19 DIAGNOSIS — Z79899 Other long term (current) drug therapy: Secondary | ICD-10-CM | POA: Diagnosis not present

## 2016-12-19 DIAGNOSIS — Z888 Allergy status to other drugs, medicaments and biological substances status: Secondary | ICD-10-CM | POA: Diagnosis not present

## 2016-12-19 DIAGNOSIS — G301 Alzheimer's disease with late onset: Secondary | ICD-10-CM | POA: Diagnosis not present

## 2017-01-10 ENCOUNTER — Other Ambulatory Visit: Payer: Self-pay | Admitting: *Deleted

## 2017-01-10 MED ORDER — AMLODIPINE-ATORVASTATIN 5-10 MG PO TABS: 1.0000 | ORAL_TABLET | Freq: Every day | ORAL | 1 refills | Status: DC

## 2017-01-11 ENCOUNTER — Other Ambulatory Visit: Payer: Self-pay

## 2017-01-11 MED ORDER — DOXAZOSIN MESYLATE 1 MG PO TABS: 1.0000 mg | ORAL_TABLET | Freq: Every day | ORAL | 3 refills | Status: DC

## 2017-01-11 MED ORDER — RAMIPRIL 10 MG PO CAPS: 10.0000 mg | ORAL_CAPSULE | Freq: Every day | ORAL | 3 refills | Status: DC

## 2017-01-11 MED ORDER — ISOSORBIDE MONONITRATE ER 60 MG PO TB24: 60.0000 mg | ORAL_TABLET | Freq: Every day | ORAL | 3 refills | Status: DC

## 2017-01-12 ENCOUNTER — Other Ambulatory Visit: Payer: Self-pay

## 2017-01-12 MED ORDER — AMLODIPINE-ATORVASTATIN 5-10 MG PO TABS: 1.0000 | ORAL_TABLET | Freq: Every day | ORAL | 2 refills | Status: DC

## 2017-01-12 MED ORDER — DOXAZOSIN MESYLATE 1 MG PO TABS: 1.0000 mg | ORAL_TABLET | Freq: Every day | ORAL | 2 refills | Status: DC

## 2017-01-12 MED ORDER — ISOSORBIDE MONONITRATE ER 60 MG PO TB24: 60.0000 mg | ORAL_TABLET | Freq: Every day | ORAL | 2 refills | Status: DC

## 2017-01-12 MED ORDER — RAMIPRIL 10 MG PO CAPS: 10.0000 mg | ORAL_CAPSULE | Freq: Every day | ORAL | 2 refills | Status: DC

## 2017-01-31 DIAGNOSIS — D485 Neoplasm of uncertain behavior of skin: Secondary | ICD-10-CM | POA: Diagnosis not present

## 2017-01-31 DIAGNOSIS — D2239 Melanocytic nevi of other parts of face: Secondary | ICD-10-CM | POA: Diagnosis not present

## 2017-01-31 DIAGNOSIS — C44319 Basal cell carcinoma of skin of other parts of face: Secondary | ICD-10-CM | POA: Diagnosis not present

## 2017-01-31 DIAGNOSIS — D2262 Melanocytic nevi of left upper limb, including shoulder: Secondary | ICD-10-CM | POA: Diagnosis not present

## 2017-01-31 DIAGNOSIS — L570 Actinic keratosis: Secondary | ICD-10-CM | POA: Diagnosis not present

## 2017-01-31 DIAGNOSIS — D170 Benign lipomatous neoplasm of skin and subcutaneous tissue of head, face and neck: Secondary | ICD-10-CM | POA: Diagnosis not present

## 2017-01-31 DIAGNOSIS — D0359 Melanoma in situ of other part of trunk: Secondary | ICD-10-CM | POA: Diagnosis not present

## 2017-01-31 DIAGNOSIS — L821 Other seborrheic keratosis: Secondary | ICD-10-CM | POA: Diagnosis not present

## 2017-01-31 DIAGNOSIS — D1801 Hemangioma of skin and subcutaneous tissue: Secondary | ICD-10-CM | POA: Diagnosis not present

## 2017-01-31 DIAGNOSIS — L720 Epidermal cyst: Secondary | ICD-10-CM | POA: Diagnosis not present

## 2017-02-20 DIAGNOSIS — R69 Illness, unspecified: Secondary | ICD-10-CM | POA: Diagnosis not present

## 2017-02-20 DIAGNOSIS — X58XXXD Exposure to other specified factors, subsequent encounter: Secondary | ICD-10-CM | POA: Diagnosis not present

## 2017-02-20 DIAGNOSIS — S0291XD Unspecified fracture of skull, subsequent encounter for fracture with routine healing: Secondary | ICD-10-CM | POA: Diagnosis not present

## 2017-02-20 DIAGNOSIS — I481 Persistent atrial fibrillation: Secondary | ICD-10-CM | POA: Diagnosis not present

## 2017-02-20 DIAGNOSIS — I213 ST elevation (STEMI) myocardial infarction of unspecified site: Secondary | ICD-10-CM | POA: Diagnosis not present

## 2017-02-20 DIAGNOSIS — S065X0D Traumatic subdural hemorrhage without loss of consciousness, subsequent encounter: Secondary | ICD-10-CM | POA: Diagnosis not present

## 2017-02-20 DIAGNOSIS — I10 Essential (primary) hypertension: Secondary | ICD-10-CM | POA: Diagnosis not present

## 2017-02-20 DIAGNOSIS — D0362 Melanoma in situ of left upper limb, including shoulder: Secondary | ICD-10-CM | POA: Diagnosis not present

## 2017-02-20 DIAGNOSIS — G301 Alzheimer's disease with late onset: Secondary | ICD-10-CM | POA: Diagnosis not present

## 2017-02-20 DIAGNOSIS — I2510 Atherosclerotic heart disease of native coronary artery without angina pectoris: Secondary | ICD-10-CM | POA: Diagnosis not present

## 2017-02-20 DIAGNOSIS — L988 Other specified disorders of the skin and subcutaneous tissue: Secondary | ICD-10-CM | POA: Diagnosis not present

## 2017-02-20 DIAGNOSIS — C4362 Malignant melanoma of left upper limb, including shoulder: Secondary | ICD-10-CM | POA: Diagnosis not present

## 2017-02-27 ENCOUNTER — Encounter: Payer: Self-pay | Admitting: Cardiovascular Disease

## 2017-02-27 ENCOUNTER — Ambulatory Visit (INDEPENDENT_AMBULATORY_CARE_PROVIDER_SITE_OTHER): Payer: Medicare HMO | Admitting: Cardiovascular Disease

## 2017-02-27 VITALS — BP 121/67 | HR 50 | Ht 66.0 in | Wt 176.0 lb

## 2017-02-27 DIAGNOSIS — I10 Essential (primary) hypertension: Secondary | ICD-10-CM | POA: Diagnosis not present

## 2017-02-27 DIAGNOSIS — R001 Bradycardia, unspecified: Secondary | ICD-10-CM

## 2017-02-27 DIAGNOSIS — I2583 Coronary atherosclerosis due to lipid rich plaque: Secondary | ICD-10-CM | POA: Diagnosis not present

## 2017-02-27 DIAGNOSIS — I2510 Atherosclerotic heart disease of native coronary artery without angina pectoris: Secondary | ICD-10-CM | POA: Diagnosis not present

## 2017-02-27 DIAGNOSIS — I482 Chronic atrial fibrillation, unspecified: Secondary | ICD-10-CM

## 2017-02-27 DIAGNOSIS — E785 Hyperlipidemia, unspecified: Secondary | ICD-10-CM

## 2017-02-27 DIAGNOSIS — F0390 Unspecified dementia without behavioral disturbance: Secondary | ICD-10-CM

## 2017-02-27 DIAGNOSIS — R69 Illness, unspecified: Secondary | ICD-10-CM | POA: Diagnosis not present

## 2017-02-27 MED ORDER — METOPROLOL SUCCINATE ER 50 MG PO TB24: 50.0000 mg | ORAL_TABLET | Freq: Every day | ORAL | 3 refills | Status: DC

## 2017-02-27 NOTE — Patient Instructions (Signed)
Your physician has recommended you make the following change in your medication:   1.) the metoprolol succ has been decreased from 75 mg daily to 50 mg daily ( 1 tablet)  Your physician wants you to follow-up in: 6 months or sooner if needed. You will receive a reminder letter in the mail two months in advance. If you don't receive a letter, please call our office to schedule the follow-up appointment.  If you need a refill on your cardiac medications before your next appointment, please call your pharmacy.

## 2017-02-27 NOTE — Progress Notes (Signed)
Patient ID: Joseph Winters, male   DOB: May 04, 1935, 82 y.o.   MRN: [id number]    PCP: Dr. Lavone Orn  HPI: Joseph Winters is a 82 y.o. male who presents to the office today for a 6 month follow up cardiology evaluation.  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 Due to concomitant CAD, he underwent staged intervention to his LAD several days later. His last intervention was in June 1994 for in-stent restenosis at both sites and he underwent repeat PTCA.  Over the past 22 years, he has been stable on aggressive medical therapy. A nuclear perfusion study in September 2013 was unchanged from previously and showed moderate to severe inferior scar without associated ischemia and his ejection fraction post stress was 43%. He has a history of hypertension as well as hyperlipidemia.   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 He was hospitalized and was found to have subarachnoid hemorrhage and small contusion. He did experience some confusion initially which resolved. He has seen Dr. Sherley Bounds from neurosurgery. 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 There is a small volume of scattered subarachnoid hemorrhage, small left middle cranial fossa subdural hemorrhage, and probable left temporal lobe hemorrhagic contusion with an associated nondisplaced right occipital and parietal skull fracture.  When I saw him in March 2015 he had noted some forgetfulness and some confusion intermittently. He underwent another CT scan which suggested acute to subacute on chronic right subdural hematoma with mild mass effect which was new. He did not have significant midline shift. There is resolution of prior parenchymal contusions and subarachnoid hemorrhage on the left. He did see Dr.Jones in followup and a head CT demonstrated improvement.   In March 2015, he was found to  be in atrial fibrillation when he presented for his echo Doppler study. Ejection fraction was 35-40%. A nuclear perfusion study showed inferior akinesis, and an ejection fraction of 40% with prior fixed, inferior, inferoseptal scar, without associated ischemia. Due to his prior CNS bleed, he is not a candidate for anticoagulation. His atrial fibrillation rate has been controlled with Toprol-XL 125 mg. I reduced his aspirin to 81 mg daily. He has been on  Toprol-XL 125 g, doxazocin 2 mg, Caduet 5/10 and ramipril10 mg for hypertension. He is tolerated the atorvastatin in Caduet with reference to his hyperlipidemia.  He has been undergoing cognitive rehabilitation. He was reevaluated at [facility] by his neurologist.  He will also be undergoing further evaluation at [facility] memory clinic.  When I last saw him, he had fallen while at [employer].  His wife had gone to get the car and at that time he was holding onto the carriage which then dropped off the curb, he lost his balance and fell.  He was evaluated in the emergency room on 07/29/2016 and  required 5 sutures .  The laceration of the left eyebrow.  CT scan was negative for intracranial injury.  He denied any associated chest pain or dizziness prior to losing his balance when the cart had fallen off the curb.   Since I last saw him, he has felt well from a cardiovascular standpoint.  He tells me that he recently had a melanoma removed from his left shoulder at [facility].  He denies chest pain or shortness of breath.  He has continued to have some memory issues and had a cognitive evaluation  at [place] he notes some mild dizziness with standing.  He is unaware of palpitations.  He presents for evaluation.  Past Medical History:  Diagnosis Date  . Coronary artery disease   . Dysrhythmia 2009   24hr holter-PVC'S  . Hypertension 08/28/2008   Echo EF 45-50%  . Myocardial infarction 1993    Past Surgical History:    Procedure Laterality Date  . CORONARY ANGIOPLASTY     initial inferior MI 1993. 1994 Repeat PTCA    No Known Allergies  Current Outpatient Prescriptions  Medication Sig Dispense Refill  . amLODipine-atorvastatin (CADUET) 5-10 MG tablet Take 1 tablet by mouth daily. 90 tablet 2  . aspirin 81 MG tablet Take 81 mg by mouth daily.    . citalopram (CELEXA) 10 MG tablet Take 10 mg by mouth daily.    Marland Kitchen doxazosin (CARDURA) 1 MG tablet Take 1 tablet (1 mg total) by mouth daily. 90 tablet 2  . isosorbide mononitrate (IMDUR) 60 MG 24 hr tablet Take 1 tablet (60 mg total) by mouth daily. 90 tablet 2  . metoprolol succinate (TOPROL-XL) 50 MG 24 hr tablet Take 1 tablet (50 mg total) by mouth daily. TAKE 1 AND 1/2 TABLETS ONCE DAILY 90 tablet 3  . Multiple Vitamins-Minerals (OCUVITE ADULT FORMULA PO) Take 1 capsule by mouth daily.    . ramipril (ALTACE) 10 MG capsule Take 1 capsule (10 mg total) by mouth daily. 90 capsule 2  . donepezil (ARICEPT) 10 MG tablet Take 10 mg by mouth at bedtime.     No current facility-administered medications for this visit.     Social History   Social History  . Marital status: Married    Spouse name: N/A  . Number of children: N/A  . Years of education: N/A   Occupational History  . Not on file.   Social History Main Topics  . Smoking status: Former Smoker    Types: Cigarettes    Quit date: 12/11/1985  . Smokeless tobacco: Never Used  . Alcohol use Yes  . Drug use: No  . Sexual activity: Not on file   Other Topics Concern  . Not on file   Social History Narrative  . No narrative on file   Family history is notable that both parents are deceased. No family history on file.  ROS General: Negative; No fevers, chills, or night sweats HEENT: Negative; No changes in vision or hearing, sinus congestion, difficulty swallowing Pulmonary: Negative; No cough, wheezing, shortness of breath, hemoptysis Cardiovascular: See HPI:  GI: Negative; No nausea,  vomiting, diarrhea, or abdominal pain GU: Negative; No dysuria, hematuria, or difficulty voiding Musculoskeletal: Negative; no myalgias, joint pain, or weakness Hematologic: Negative; no easy bruising, bleeding Endocrine: Negative; no heat/cold intolerance; no diabetes, Neuro: Negative; no changes in balance, headaches Skin: Negative; No rashes or skin lesions Psychiatric: Negative; No behavioral problems, depression Sleep: Negative; No snoring,  daytime sleepiness, hypersomnolence, bruxism, restless legs, hypnogognic hallucinations. Other comprehensive 14 point system review is negative   Physical Exam BP 121/67   Pulse (!) 50   Ht '5\' 6"'  (1.676 m)   Wt 176 lb (79.8 kg)   BMI 28.41 kg/m    The blood pressure by me was 132/68 supine and was 130/70 standing.  Wt Readings from Last 3 Encounters:  02/27/17 176 lb (79.8 kg)  08/22/16 174 lb (78.9 kg)  04/27/16 165 lb 12.8 oz (75.2 kg)   General: Alert, oriented, no distress.  Skin: normal turgor, no  rashes, warm and dry HEENT: Normocephalic, atraumatic. Pupils equal round and reactive to light; sclera anicteric; extraocular muscles intact, No lid lag; Nose without nasal septal hypertrophy; Mouth/Parynx benign; Mallinpatti scale 3 Neck: No JVD, no carotid bruits; normal carotid upstroke Lungs: clear to ausculatation and percussion bilaterally; no wheezing or rales, normal inspiratory and expiratory effort Chest wall: without tenderness to palpitation Heart: PMI not displaced, RRR, s1 s2 normal, 1/6 systolic murmur, No diastolic murmur, no rubs, gallops, thrills, or heaves Abdomen: soft, nontender; no hepatosplenomehaly, BS+; abdominal aorta nontender and not dilated by palpation. Back: no CVA tenderness Pulses: 2+  Musculoskeletal: full range of motion, normal strength, no joint deformities Extremities: Pulses 2+, no clubbing cyanosis or edema, Homan's sign negative  Neurologic: grossly nonfocal; Cranial nerves grossly  wnl Psychologic: Normal mood and affect  ECG (independently read by me): Atrial fibrillation at 50 bpm.  Left bundle branch block.  Old inferior MI Q waves  September 2017 ECG (independently read by me): Atrial fibrillation with ventricular response at 51 bpm.  Nonspecific interventricular block.  Poor anterior R-wave progression.  Inferior Q waves.  May 2017 ECG (independently read by me): Atrial fibrillation with a reduced ventricular response at 42 bpm.  Her anterior R-wave progression V1 through V4.  Incomplete left bundle branch block with inferolateral T wave abnormality.  October 2016 ECG (independently read by me): Atrial fibrillation at 60 bpm.  Nonspecific interventricular conduction delay.  Old inferior Q waves.  Poor anterior R-wave progression.  QTc interval 442 ms.  June 2016 ECG (independently read by me):  Atrial fibrillation with a controlled ventricular response in the 60s.  Left bundle branch block with repolarization changes.  December 2015ECG (independently read by me): Atrial fibrillation. Ventricular rate averaging 65 bpm. Old inferior infarction. Poor R wave progression. PVC.   June 2015 ECG today (independently read by me): Atrial fibrillation with a controlled ventricular rate at 64 beats per minute. Old Q waves in leads 3 and aVF. Incomplete left bundle branch block.   ECG 04/02/14 (independently read by me): Atrial fibrillation with ventricular rate at 60. Old inferior Q waves in leads 3 and aVF. Incomplete left bundle branch block   Prior 02/18/2014 ECG (independently read by me): Sinus bradycardia 52 beats per minute with a PAC. There also is T wave inversion in leads V4 through V6 as well as lead 2 and 3. There is left axis deviation. In comparison to his prior ECG one year previously in our office his T wave changes in V4 through V6 are more pronounced.    LABS:  BMP Latest Ref Rng & Units 07/29/2016 05/04/2016 01/21/2014  Glucose 65 - 99 mg/dL 96 88 109(H)  BUN  6 - 20 mg/dL '11 10 12  ' Creatinine 0.61 - 1.24 mg/dL 0.82 0.84 0.90  Sodium 135 - 145 mmol/L 130(L) 134(L) 133(L)  Potassium 3.5 - 5.1 mmol/L 3.4(L) 4.1 3.9  Chloride 101 - 111 mmol/L 97(L) 99 94(L)  CO2 22 - 32 mmol/L 24 25 -  Calcium 8.9 - 10.3 mg/dL 8.9 8.9 -    Hepatic Function Latest Ref Rng & Units 05/04/2016 01/21/2014  Total Protein 6.1 - 8.1 g/dL 6.4 6.6  Albumin 3.6 - 5.1 g/dL 4.2 4.0  AST 10 - 35 U/L 24 24  ALT 9 - 46 U/L 23 19  Alk Phosphatase 40 - 115 U/L 54 53  Total Bilirubin 0.2 - 1.2 mg/dL 1.1 0.6    CBC Latest Ref Rng & Units 07/29/2016 05/04/2016 01/21/2014  WBC 4.0 - 10.5 K/uL 7.3 5.6 -  Hemoglobin 13.0 - 17.0 g/dL 13.9 14.8 14.3  Hematocrit 39.0 - 52.0 % 40.6 43.9 42.0  Platelets 150 - 400 K/uL 203 215 -   Lab Results  Component Value Date   MCV 90.6 07/29/2016   MCV 89.8 05/04/2016   MCV 88.5 01/21/2014    Lab Results  Component Value Date   TSH 2.27 05/04/2016    BNP No results found for: BNP  ProBNP No results found for: PROBNP   Lipid Panel     Component Value Date/Time   CHOL 151 05/04/2016 1020   TRIG 52 05/04/2016 1020   HDL 84 05/04/2016 1020   CHOLHDL 1.8 05/04/2016 1020   VLDL 10 05/04/2016 1020   LDLCALC 57 05/04/2016 1020     RADIOLOGY: No results found.  IMPRESSION:  1. Coronary artery disease due to lipid rich plaque   2. Essential hypertension   3. Chronic atrial fibrillation ([place])   4. Hyperlipidemia with target LDL less than 70   5. Bradycardia   6. Dementia without behavioral disturbance, unspecified dementia type     ASSESSMENT AND PLAN: Joseph Winters is a 82 year old gentleman who is 25 years status post inferolateral wall MI treated with PCI to his RCA and stage intervention to his LAD in November 1993. He developed in-stent restenosis one year later in June 1994 but has been stable with reference to his CAD.  His last nuclear study showed inferior scar with an EF of 40%. He has permanent atrial fibrillation.  I  last saw him, he was bradycardic and I reduced his Toprol from 100 mg down to 75 mg daily.  His blood pressure today is 106/66 supine and was 110/64 standing.  He has permanent atrial fibrillation and his ventricular rate today is in the 50s.  I will further reduce Toprol to 50 mg daily, which be helpful both for his bradycardia and potentially allow for further increase in his blood pressure.  He continues to be on amlodipine with atorvastatin 5/10, Cardura 1 mg, ramipril 10 mg, in addition to isosorbide.  He is not having anginal symptoms.  He recently had a melanoma removed from his left shoulder.  On exam there are no signs of CHF.  He is on Aricept 10 mg for his memory difficulty and continues to take Celexa 10 mg.  I will see him in 6 months for cardiology reevaluation. Time spent: 25 minutes  Troy Sine, MD, [facility]  03/01/2017 5:27 PM

## 2017-03-06 DIAGNOSIS — C44319 Basal cell carcinoma of skin of other parts of face: Secondary | ICD-10-CM | POA: Diagnosis not present

## 2017-03-16 ENCOUNTER — Telehealth: Payer: Self-pay | Admitting: Cardiovascular Disease

## 2017-03-16 NOTE — Telephone Encounter (Signed)
Agree.  If blood pressure remains low and < 50 , he may need to further decrease metoprolol to 25 mg.

## 2017-03-16 NOTE — Telephone Encounter (Signed)
Spoke with Ebony Hail, daughter, and communicated MD's tentative plan pending BP & HR readings. She appreciated call back & update and will monitor BP & HR as directed.

## 2017-03-16 NOTE — Telephone Encounter (Signed)
Spoke with daughter Ebony Hail Patient had SOB + heart rate is fluctuating yesterday 4/5 while at the movies This episode was isolated and lasted about 10 minutes He was breathing heavily yesterday afternoon and couldn't catch his breath  She states he was more confused that normal HR 39 at this time (palpated manually) - no BP reading His HR came back up to mid 60s, which is normal per daughter He has h/o AFib   Saw Dr. Claiborne Billings on 02/27/17 - bradycardic and metoprolol decreased from 75mg  to 50mg   Advised that it sounds like his AFib is causing his acute, isolated symptoms. The episode lasted about 10 minutes and he felt much better once his HR increased per daughter. He is fine today.   Advised that they monitor BP & HR for about a week and notify our office of those readings to see a trend in his VS. Advised that a med change is likely NOT to be made based on a singular episode of low HR. Daughter voiced understanding.   Will route to Dr. Arnette Norris, CMA for further advice

## 2017-03-16 NOTE — Telephone Encounter (Signed)
LMTCB

## 2017-03-16 NOTE — Telephone Encounter (Signed)
New Message     Per daughter pt is having SOB pulse 39  Pt c/o Shortness Of Breath: STAT if SOB developed within the last 24 hours or pt is noticeably SOB on the phone  1. Are you currently SOB (can you hear that pt is SOB on the phone)? Daughter on phone  2. How long have you been experiencing SOB? Off and on  3. Are you SOB when sitting or when up moving around? Sitting   4. Are you currently experiencing any other symptoms? He has fallen, and pulse went back up to 60's

## 2017-05-22 DIAGNOSIS — Z111 Encounter for screening for respiratory tuberculosis: Secondary | ICD-10-CM | POA: Diagnosis not present

## 2017-07-12 DIAGNOSIS — Z7982 Long term (current) use of aspirin: Secondary | ICD-10-CM | POA: Diagnosis not present

## 2017-07-12 DIAGNOSIS — G301 Alzheimer's disease with late onset: Secondary | ICD-10-CM | POA: Diagnosis not present

## 2017-07-12 DIAGNOSIS — R69 Illness, unspecified: Secondary | ICD-10-CM | POA: Diagnosis not present

## 2017-07-12 DIAGNOSIS — Z79899 Other long term (current) drug therapy: Secondary | ICD-10-CM | POA: Diagnosis not present

## 2017-07-12 DIAGNOSIS — I481 Persistent atrial fibrillation: Secondary | ICD-10-CM | POA: Diagnosis not present

## 2017-07-12 DIAGNOSIS — Z888 Allergy status to other drugs, medicaments and biological substances status: Secondary | ICD-10-CM | POA: Diagnosis not present

## 2017-07-12 DIAGNOSIS — I10 Essential (primary) hypertension: Secondary | ICD-10-CM | POA: Diagnosis not present

## 2017-07-27 DIAGNOSIS — I10 Essential (primary) hypertension: Secondary | ICD-10-CM | POA: Diagnosis not present

## 2017-07-27 DIAGNOSIS — R69 Illness, unspecified: Secondary | ICD-10-CM | POA: Diagnosis not present

## 2017-07-27 DIAGNOSIS — Z1389 Encounter for screening for other disorder: Secondary | ICD-10-CM | POA: Diagnosis not present

## 2017-07-27 DIAGNOSIS — Z0000 Encounter for general adult medical examination without abnormal findings: Secondary | ICD-10-CM | POA: Diagnosis not present

## 2017-07-27 DIAGNOSIS — E7800 Pure hypercholesterolemia, unspecified: Secondary | ICD-10-CM | POA: Diagnosis not present

## 2017-07-27 DIAGNOSIS — I25119 Atherosclerotic heart disease of native coronary artery with unspecified angina pectoris: Secondary | ICD-10-CM | POA: Diagnosis not present

## 2017-08-12 ENCOUNTER — Encounter (HOSPITAL_COMMUNITY): Payer: Self-pay | Admitting: *Deleted

## 2017-08-12 ENCOUNTER — Emergency Department (HOSPITAL_COMMUNITY)
Admission: EM | Admit: 2017-08-12 | Discharge: 2017-08-12 | Disposition: A | Discharge status: Home / Self Care | Payer: Medicare HMO | Attending: Emergency Medicine | Admitting: Emergency Medicine

## 2017-08-12 ENCOUNTER — Emergency Department (HOSPITAL_COMMUNITY): Payer: Medicare HMO

## 2017-08-12 DIAGNOSIS — Y92120 Kitchen in nursing home as the place of occurrence of the external cause: Secondary | ICD-10-CM | POA: Diagnosis not present

## 2017-08-12 DIAGNOSIS — Z87891 Personal history of nicotine dependence: Secondary | ICD-10-CM | POA: Diagnosis not present

## 2017-08-12 DIAGNOSIS — M79641 Pain in right hand: Secondary | ICD-10-CM | POA: Diagnosis not present

## 2017-08-12 DIAGNOSIS — Z955 Presence of coronary angioplasty implant and graft: Secondary | ICD-10-CM | POA: Diagnosis not present

## 2017-08-12 DIAGNOSIS — R4182 Altered mental status, unspecified: Secondary | ICD-10-CM | POA: Insufficient documentation

## 2017-08-12 DIAGNOSIS — S52591A Other fractures of lower end of right radius, initial encounter for closed fracture: Secondary | ICD-10-CM | POA: Diagnosis not present

## 2017-08-12 DIAGNOSIS — M25551 Pain in right hip: Secondary | ICD-10-CM | POA: Diagnosis not present

## 2017-08-12 DIAGNOSIS — Y9389 Activity, other specified: Secondary | ICD-10-CM | POA: Diagnosis not present

## 2017-08-12 DIAGNOSIS — I2510 Atherosclerotic heart disease of native coronary artery without angina pectoris: Secondary | ICD-10-CM | POA: Diagnosis not present

## 2017-08-12 DIAGNOSIS — I10 Essential (primary) hypertension: Secondary | ICD-10-CM | POA: Insufficient documentation

## 2017-08-12 DIAGNOSIS — M25532 Pain in left wrist: Secondary | ICD-10-CM | POA: Diagnosis not present

## 2017-08-12 DIAGNOSIS — T148XXA Other injury of unspecified body region, initial encounter: Secondary | ICD-10-CM | POA: Diagnosis not present

## 2017-08-12 DIAGNOSIS — S52501A Unspecified fracture of the lower end of right radius, initial encounter for closed fracture: Secondary | ICD-10-CM | POA: Insufficient documentation

## 2017-08-12 DIAGNOSIS — S59911A Unspecified injury of right forearm, initial encounter: Secondary | ICD-10-CM | POA: Diagnosis present

## 2017-08-12 DIAGNOSIS — M25531 Pain in right wrist: Secondary | ICD-10-CM | POA: Diagnosis not present

## 2017-08-12 DIAGNOSIS — S299XXA Unspecified injury of thorax, initial encounter: Secondary | ICD-10-CM | POA: Diagnosis not present

## 2017-08-12 DIAGNOSIS — Z79899 Other long term (current) drug therapy: Secondary | ICD-10-CM | POA: Diagnosis not present

## 2017-08-12 DIAGNOSIS — S52601A Unspecified fracture of lower end of right ulna, initial encounter for closed fracture: Secondary | ICD-10-CM | POA: Diagnosis not present

## 2017-08-12 DIAGNOSIS — Y999 Unspecified external cause status: Secondary | ICD-10-CM | POA: Diagnosis not present

## 2017-08-12 DIAGNOSIS — S6992XA Unspecified injury of left wrist, hand and finger(s), initial encounter: Secondary | ICD-10-CM | POA: Diagnosis not present

## 2017-08-12 DIAGNOSIS — W19XXXA Unspecified fall, initial encounter: Secondary | ICD-10-CM

## 2017-08-12 DIAGNOSIS — S6991XA Unspecified injury of right wrist, hand and finger(s), initial encounter: Secondary | ICD-10-CM | POA: Diagnosis not present

## 2017-08-12 DIAGNOSIS — Z7982 Long term (current) use of aspirin: Secondary | ICD-10-CM | POA: Diagnosis not present

## 2017-08-12 DIAGNOSIS — S52531A Colles' fracture of right radius, initial encounter for closed fracture: Secondary | ICD-10-CM | POA: Diagnosis not present

## 2017-08-12 DIAGNOSIS — S79911A Unspecified injury of right hip, initial encounter: Secondary | ICD-10-CM | POA: Diagnosis not present

## 2017-08-12 DIAGNOSIS — W0110XA Fall on same level from slipping, tripping and stumbling with subsequent striking against unspecified object, initial encounter: Secondary | ICD-10-CM | POA: Insufficient documentation

## 2017-08-12 DIAGNOSIS — S52691A Other fracture of lower end of right ulna, initial encounter for closed fracture: Secondary | ICD-10-CM | POA: Diagnosis not present

## 2017-08-12 HISTORY — DX: Unspecified atrial fibrillation: I48.91

## 2017-08-12 HISTORY — DX: Nontraumatic subdural hemorrhage, unspecified: I62.00

## 2017-08-12 HISTORY — DX: Alzheimer's disease, unspecified: G30.9

## 2017-08-12 HISTORY — DX: Dementia in other diseases classified elsewhere, unspecified severity, without behavioral disturbance, psychotic disturbance, mood disturbance, and anxiety: F02.80

## 2017-08-12 MED ORDER — DOCUSATE SODIUM 250 MG PO CAPS: 250.0000 mg | ORAL_CAPSULE | Freq: Every day | ORAL | 0 refills | Status: DC

## 2017-08-12 MED ORDER — ACETAMINOPHEN 500 MG PO TABS: 500.0000 mg | ORAL_TABLET | Freq: Four times a day (QID) | ORAL | 0 refills | Status: DC | PRN

## 2017-08-12 MED ORDER — OXYCODONE HCL 5 MG PO TABS: 2.5000 mg | ORAL_TABLET | Freq: Four times a day (QID) | ORAL | 0 refills | Status: DC | PRN

## 2017-08-12 MED ORDER — LIDOCAINE HCL (PF) 1 % IJ SOLN
30.0000 mL | Freq: Once | INTRAMUSCULAR | Status: AC
  Administered 2017-08-12: 30 mL via INTRADERMAL
  Filled 2017-08-12: qty 30

## 2017-08-12 NOTE — ED Notes (Signed)
EDP at bedside  

## 2017-08-12 NOTE — ED Triage Notes (Addendum)
[facility]. Patient with witnessed fall. Right wrist pain and swelling. Mild swelling to upper lip. No loc.

## 2017-08-12 NOTE — ED Notes (Signed)
Ice pack to right wrist.

## 2017-08-12 NOTE — ED Notes (Signed)
ED Provider at bedside. 

## 2017-08-12 NOTE — Consult Note (Signed)
Orthopaedic Trauma Service (OTS) Consult   Patient ID: Joseph Winters MRN: [id number] DOB/AGE: 1935/12/10 82 y.o.   Reason for Consult: Right Distal Radius fracture, closed Referring Physician:  Duffy Bruce, MD (EDP)   HPI: Joseph Winters is an 83 y.o. RHD white male who is a resident of [place] who sustained a ground-level fall while at church service this morning. Patient got tripped up and fell landing on his right wrist and right hip. Patient had onset of right wrist and hip pain. He was brought to [facility] for evaluation. Workup was notable for a right distal radius fracture. Patient denies any additional injuries. He is pleasant but shows signs of dementia  Pt seen and evaluated with Dr. Marcelino Scot   Past Medical History:  Diagnosis Date  . Alzheimer disease   . Atrial fibrillation ([facility])   . Coronary artery disease   . Dysrhythmia 2009   24hr holter-PVC'S  . Hypertension 08/28/2008   Echo EF 45-50%  . Myocardial infarction ([place]) 1993  . Subdural hemorrhage [facility])     Past Surgical History:  Procedure Laterality Date  . CORONARY ANGIOPLASTY     initial inferior MI 1993. 1994 Repeat PTCA    History reviewed. No pertinent family history.  Social History:  reports that he quit smoking about 31 years ago. His smoking use included Cigarettes. He has never used smokeless tobacco. He reports that he drinks alcohol. He reports that he does not use drugs.   Originally from [place] in [place], has been there about 6 months or so   Allergies: No Known Allergies  Medications: I have reviewed the patient's current medications. Prior to Admission:  No current facility-administered medications on file prior to encounter.    Current Outpatient Prescriptions on File Prior to Encounter  Medication Sig Dispense Refill  . amLODipine-atorvastatin (CADUET) 5-10 MG tablet Take 1 tablet by mouth daily. 90 tablet 2  . aspirin 81 MG tablet Take 81 mg  by mouth daily.    Marland Kitchen doxazosin (CARDURA) 1 MG tablet Take 1 tablet (1 mg total) by mouth daily. 90 tablet 2  . isosorbide mononitrate (IMDUR) 60 MG 24 hr tablet Take 1 tablet (60 mg total) by mouth daily. 90 tablet 2  . metoprolol succinate (TOPROL-XL) 50 MG 24 hr tablet Take 1 tablet (50 mg total) by mouth daily. TAKE 1 AND 1/2 TABLETS ONCE DAILY 90 tablet 3  . ramipril (ALTACE) 10 MG capsule Take 1 capsule (10 mg total) by mouth daily. 90 capsule 2  . citalopram (CELEXA) 10 MG tablet Take 10 mg by mouth daily.    Marland Kitchen donepezil (ARICEPT) 10 MG tablet Take 10 mg by mouth at bedtime.    . Multiple Vitamins-Minerals (OCUVITE ADULT FORMULA PO) Take 1 capsule by mouth daily.      No results found for this or any previous visit (from the past 48 hour(s)).  Dg Chest 2 View  Result Date: 08/12/2017 CLINICAL DATA:  Fall EXAM: CHEST  2 VIEW COMPARISON:  01/21/2014 chest radiograph. FINDINGS: Stable cardiomediastinal silhouette with mild cardiomegaly and aortic atherosclerosis. No pneumothorax. No pleural effusion. Lungs appear clear, with no acute consolidative airspace disease and no pulmonary edema. No displaced fractures in the visualized chest. IMPRESSION: Stable mild cardiomegaly without pulmonary edema. No active pulmonary disease. Electronically Signed   By: Ilona Sorrel M.D.   On: 08/12/2017 12:45   Dg Wrist Complete Left  Result Date: 08/12/2017 CLINICAL DATA:  Left wrist pain  following a fall. EXAM: LEFT WRIST - COMPLETE 3+ VIEW COMPARISON:  None. FINDINGS: First metacarpal/carpal degenerative changes. Degenerative changes at the articulation of the scaphoid with the trapezium and trapezoid. No fracture or dislocation seen. IMPRESSION: No fracture or dislocation.  Degenerative changes. Electronically Signed   By: Claudie Revering M.D.   On: 08/12/2017 12:51   Dg Wrist Complete Right  Result Date: 08/12/2017 CLINICAL DATA:  Right wrist pain following a fall. EXAM: RIGHT WRIST - COMPLETE 3+ VIEW  COMPARISON:  None. FINDINGS: Mildly comminuted fracture of the distal radial metaphysis and epiphysis, extending into the radiocarpal joint. Mild dorsal angulation of the distal fragment. Essentially nondisplaced fracture of the ulnar styloid. First metacarpal/carpal degenerative changes and degenerative changes at the articulation of the scaphoid with the trapezium and trapezoid. Mild first IP joint degenerative changes. IMPRESSION: 1. Distal radius and ulnar styloid fractures, as described above. 2. Multi joint degenerative changes. Electronically Signed   By: Claudie Revering M.D.   On: 08/12/2017 12:46   Ct Head Wo Contrast  Result Date: 08/12/2017 CLINICAL DATA:  Altered mental status. EXAM: CT HEAD WITHOUT CONTRAST CT CERVICAL SPINE WITHOUT CONTRAST TECHNIQUE: Multidetector CT imaging of the head and cervical spine was performed following the standard protocol without intravenous contrast. Multiplanar CT image reconstructions of the cervical spine were also generated. COMPARISON:  Head CT dated 07/29/2016. Cervical spine CT dated 01/21/2014. FINDINGS: CT HEAD FINDINGS Brain: Diffusely enlarged ventricles and subarachnoid spaces. Patchy white matter low density in both cerebral hemispheres. No intracranial hemorrhage, mass lesion or CT evidence of acute infarction. Vascular: No hyperdense vessel or unexpected calcification. Skull: Normal. Negative for fracture or focal lesion. Sinuses/Orbits: Status post bilateral cataract extraction. Unremarkable paranasal sinuses. Other: Right concha bullosa and deviation of the midportion of the nasal septum to the left. CT CERVICAL SPINE FINDINGS Alignment: Normal. Skull base and vertebrae: No acute fracture. No primary bone lesion or focal pathologic process. Soft tissues and spinal canal: No prevertebral fluid or swelling. No visible canal hematoma. Disc levels:  Multilevel degenerative changes. Upper chest: Clear lung apices. Other: Dense bilateral carotid artery  calcifications. IMPRESSION: 1. No skull fracture or intracranial hemorrhage. 2. No cervical spine fracture or subluxation. 3. Stable moderate diffuse cerebral atrophy and minimal chronic small vessel white matter ischemic changes in both cerebral hemispheres. 4. Multilevel cervical spine degenerative changes. 5. Dense bilateral carotid artery atheromatous calcifications. Electronically Signed   By: Claudie Revering M.D.   On: 08/12/2017 13:14   Ct Cervical Spine Wo Contrast  Result Date: 08/12/2017 CLINICAL DATA:  Altered mental status. EXAM: CT HEAD WITHOUT CONTRAST CT CERVICAL SPINE WITHOUT CONTRAST TECHNIQUE: Multidetector CT imaging of the head and cervical spine was performed following the standard protocol without intravenous contrast. Multiplanar CT image reconstructions of the cervical spine were also generated. COMPARISON:  Head CT dated 07/29/2016. Cervical spine CT dated 01/21/2014. FINDINGS: CT HEAD FINDINGS Brain: Diffusely enlarged ventricles and subarachnoid spaces. Patchy white matter low density in both cerebral hemispheres. No intracranial hemorrhage, mass lesion or CT evidence of acute infarction. Vascular: No hyperdense vessel or unexpected calcification. Skull: Normal. Negative for fracture or focal lesion. Sinuses/Orbits: Status post bilateral cataract extraction. Unremarkable paranasal sinuses. Other: Right concha bullosa and deviation of the midportion of the nasal septum to the left. CT CERVICAL SPINE FINDINGS Alignment: Normal. Skull base and vertebrae: No acute fracture. No primary bone lesion or focal pathologic process. Soft tissues and spinal canal: No prevertebral fluid or swelling. No visible canal hematoma. Disc levels:  Multilevel degenerative changes. Upper chest: Clear lung apices. Other: Dense bilateral carotid artery calcifications. IMPRESSION: 1. No skull fracture or intracranial hemorrhage. 2. No cervical spine fracture or subluxation. 3. Stable moderate diffuse cerebral  atrophy and minimal chronic small vessel white matter ischemic changes in both cerebral hemispheres. 4. Multilevel cervical spine degenerative changes. 5. Dense bilateral carotid artery atheromatous calcifications. Electronically Signed   By: Claudie Revering M.D.   On: 08/12/2017 13:14   Dg Hand Complete Right  Result Date: 08/12/2017 CLINICAL DATA:  Right wrist and hand pain following a fall. EXAM: RIGHT HAND - COMPLETE 3+ VIEW COMPARISON:  Right wrist radiographs obtained at the same time. FINDINGS: Previously described distal radius and ulnar styloid fractures. Previously described multi joint degenerative changes. No hand fracture or dislocation seen. IMPRESSION: 1. No hand fracture or dislocation. 2. Previously described distal radius and ulnar styloid fractures. 3. Multi joint degenerative changes. Electronically Signed   By: Claudie Revering M.D.   On: 08/12/2017 12:47   Dg Hip Unilat W Or Wo Pelvis 2-3 Views Right  Result Date: 08/12/2017 CLINICAL DATA:  Right hip pain following a fall. EXAM: DG HIP (WITH OR WITHOUT PELVIS) 2-3V RIGHT COMPARISON:  None. FINDINGS: Diffuse osteopenia. No fracture or dislocation seen. Lower thoracic spine degenerative changes. Atheromatous arterial calcifications. IMPRESSION: No fracture or dislocation. Electronically Signed   By: Claudie Revering M.D.   On: 08/12/2017 12:44    Review of Systems  Respiratory: Negative for shortness of breath.   Cardiovascular: Negative for chest pain.  Gastrointestinal: Negative for nausea and vomiting.  Musculoskeletal: Positive for joint pain (R wrist ).  Neurological: Negative for tingling and sensory change.   Blood pressure (!) 152/75, pulse 63, temperature 98.1 F (36.7 C), temperature source Oral, resp. rate 20, SpO2 99 %. Physical Exam  Constitutional: He appears well-developed and well-nourished. He is cooperative.  NAD  Musculoskeletal:  Right Upper Extremity  Inspection:  + swelling and deformity to right wrist   Elbow  and humerus w/o deformity Bony eval:   + TTP distal radius, + crepitus     nontender hand, elbow and upper arm  Soft tissue:    + edema and ecchymosis to R distal radius     No swelling to R elbow or shoulder  ROM:    Good elbow and shoulder ROM, pain free Sensation:    R/U/M sens intact Motor:    R/U/M/AIN/PIN motor intact Vascular:    + radial pulse    Ext warm     Brisk cap refill   Right Lower Extremity   No traumatic wounds, ecchymosis, or rash  Nontender  No knee or ankle effusion  Knee stable to varus/ valgus and anterior/posterior stress  Sens DPN, SPN, TN intact  Motor EHL, ext, flex, evers 5/5  DP 2+, PT 2+, No significant edema        Assessment/Plan:  82 y/o RHD white male with dementia and CAD s/p fall with R distal radius fracture  - comminuted R distal radius fracture:  Closed reduction and splinting in ED   Hematoma block   Ice and elevate  Sling to be worn at all times, it is part of the splint  NWB   Ok to move fingers  - Pain management:  Tylenol  Low dose narcotics if needed  -- Metabolic Bone Disease:  Will check vitamin d levels before dc from ED as we know low vitamin d levels linked to increase fall risk   Also would  suggest that pt has osteoporosis given GLF and distal radius fracture  -Ex-fix/Splint care:  Keep splint clean and dry   - Dispo:  Closed reduction in ED  Dc back to facility after  Follow up with ortho on 08/15/2017   Procedure  Closed reduction R distal radius with hematoma block Coaptation splint applied  Pt tolerated procedure well     Jari Pigg, PA-C [facility] [phone] (P) 08/12/2017, 3:06 PM

## 2017-08-12 NOTE — ED Notes (Signed)
Patient transported to X-ray 

## 2017-08-12 NOTE — Progress Notes (Signed)
Orthopedic Tech Progress Note Patient Details:  Joseph Winters [birth year] [id number]  Ortho Devices Type of Ortho Device: Arm sling, Ace wrap, Sugartong splint Ortho Device/Splint Location: rue Ortho Device/Splint Interventions: Application   Jeanni Allshouse 08/12/2017, 3:19 PM

## 2017-08-12 NOTE — Discharge Instructions (Signed)
You can apply ice over the splint to help with swelling, for the next 24 hours. Do not apply the ice directly to the skin or for more than 20 minutes at a time.  Do not lift with R arm   Wear sling at all times   Ok to move fingers to help with swelling  Ice and elevate wrist above heart

## 2017-08-12 NOTE — ED Notes (Signed)
Ortho tech consulted 

## 2017-08-12 NOTE — ED Notes (Signed)
Pt returns from radiology wife at bedside.

## 2017-08-12 NOTE — ED Notes (Signed)
Patient to xray/ct

## 2017-08-12 NOTE — ED Provider Notes (Signed)
[facility] DEPT Provider Note   CSN: [id number] Arrival date & time: 08/12/17  1059     History   Chief Complaint Chief Complaint  Patient presents with  . Fall  . Wrist Pain    HPI Joseph Winters is a 82 y.o. male.  HPI   82 yo M with PMHx as below including dementia here with fall. Pt was reportedly waiting to get a meal today in a crowded room at his SNF when he tripped over someone's feet. He fell directly onto his right arm. He also struck hsi head and lip on the ground. No LOC. He currently c/o aching, mild right wrist, right hip, and mild headache. Pain is worse with movement and palpation. He has not taken anything for this. He has no blood thinner use.  Past Medical History:  Diagnosis Date  . Alzheimer disease   . Atrial fibrillation ([place])   . Coronary artery disease   . Dysrhythmia 2009   24hr holter-PVC'S  . Hypertension 08/28/2008   Echo EF 45-50%  . Myocardial infarction ([place]) 1993  . Subdural hemorrhage [facility])     Patient Active Problem List   Diagnosis Date Noted  . Chronic atrial fibrillation ([place]) 11/15/2014  . Subdural hematoma ([place]) 04/04/2014  . PAF (paroxysmal atrial fibrillation), new from 01/2014 rate controlled.  03/06/2014  . Old MI (myocardial infarction) 03/01/2014  . CAD (coronary artery disease) 03/01/2014  . HTN (hypertension) 03/01/2014  . Hyperlipidemia with target LDL less than 70 03/01/2014  . Closed head injury 01/21/2014    Past Surgical History:  Procedure Laterality Date  . CORONARY ANGIOPLASTY     initial inferior MI [place] Repeat PTCA       Home Medications    Prior to Admission medications   Medication Sig Start Date End Date Taking? Authorizing Provider  amLODipine-atorvastatin (CADUET) 5-10 MG tablet Take 1 tablet by mouth daily. 01/12/17  Yes Troy Sine, MD  aspirin 81 MG tablet Take 81 mg by mouth daily.   Yes [provider]  doxazosin (CARDURA) 1 MG tablet Take 1 tablet (1 mg total) by  mouth daily. 01/12/17  Yes Troy Sine, MD  isosorbide mononitrate (IMDUR) 60 MG 24 hr tablet Take 1 tablet (60 mg total) by mouth daily. 01/12/17  Yes Troy Sine, MD  metoprolol succinate (TOPROL-XL) 50 MG 24 hr tablet Take 1 tablet (50 mg total) by mouth daily. TAKE 1 AND 1/2 TABLETS ONCE DAILY 02/27/17  Yes Troy Sine, MD  ramipril (ALTACE) 10 MG capsule Take 1 capsule (10 mg total) by mouth daily. 01/12/17  Yes Troy Sine, MD  acetaminophen (TYLENOL) 500 MG tablet Take 1-2 tablets (500-1,000 mg total) by mouth every 6 (six) hours as needed for mild pain or moderate pain. 08/12/17   Duffy Bruce, MD  citalopram (CELEXA) 10 MG tablet Take 10 mg by mouth daily. 02/10/17   [provider]  docusate sodium (COLACE) 250 MG capsule Take 1 capsule (250 mg total) by mouth daily. When taking the oxycodone, to prevent constipation 08/12/17   Duffy Bruce, MD  donepezil (ARICEPT) 10 MG tablet Take 10 mg by mouth at bedtime. 05/09/16 08/07/16  [provider]  Multiple Vitamins-Minerals (OCUVITE ADULT FORMULA PO) Take 1 capsule by mouth daily.    [provider]  oxyCODONE (ROXICODONE) 5 MG immediate release tablet Take 0.5 tablets (2.5 mg total) by mouth every 6 (six) hours as needed for severe pain. 08/12/17   Duffy Bruce,  MD    Family History History reviewed. No pertinent family history.  Social History Social History  Substance Use Topics  . Smoking status: Former Smoker    Types: Cigarettes    Quit date: 12/11/1985  . Smokeless tobacco: Never Used  . Alcohol use Yes     Allergies   Patient has no known allergies.   Review of Systems Review of Systems  Constitutional: Negative for chills and fever.  Respiratory: Negative for shortness of breath.   Cardiovascular: Negative for chest pain.  Musculoskeletal: Positive for arthralgias. Negative for neck pain.  Skin: Negative for rash and wound.  Allergic/Immunologic: Negative for immunocompromised  state.  Neurological: Negative for weakness and numbness.  Hematological: Does not bruise/bleed easily.  All other systems reviewed and are negative.    Physical Exam Updated Vital Signs BP 137/80 (BP Location: Left Arm)   Pulse (!) 57   Temp 98.1 F (36.7 C) (Oral)   Resp 18   SpO2 99%   Physical Exam  Constitutional: He is oriented to person, place, and time. He appears well-developed and well-nourished. No distress.  HENT:  Head: Normocephalic and atraumatic.  Small contusion to upper lip. No underlying laceration. Dentures in place w/o apparent trauma.  Eyes: Conjunctivae are normal.  Neck: Neck supple.  Mild midline cervical TTP. No deformity.  Cardiovascular: Normal rate, regular rhythm and normal heart sounds.  Exam reveals no friction rub.   No murmur heard. Pulmonary/Chest: Effort normal and breath sounds normal. No respiratory distress. He has no wheezes. He has no rales.  Abdominal: He exhibits no distension.  Musculoskeletal: He exhibits no edema.  Mild TTP over right hip, worse with internal/external rotation of leg. No deformity. No shortening.  Neurological: He is alert and oriented to person, place, and time. He exhibits normal muscle tone.  Skin: Skin is warm. Capillary refill takes less than 2 seconds.  Psychiatric: He has a normal mood and affect.  Nursing note and vitals reviewed.   UPPER EXTREMITY EXAM: RIGHT  INSPECTION & PALPATION: Moderate TTP over distal wrist, with swelling along distal radial aspect of wrist and proximal hand. No open wounds.  SENSORY: Sensation is intact to light touch in:  Superficial radial nerve distribution (dorsal first web space) Median nerve distribution (tip of index finger)   Ulnar nerve distribution (tip of small finger)     MOTOR:  + Motor posterior interosseous nerve (thumb IP extension) + Anterior interosseous nerve (thumb IP flexion, index finger DIP flexion) + Radial nerve (wrist extension) + Median nerve  (palpable firing thenar mass) + Ulnar nerve (palpable firing of first dorsal interosseous muscle)  VASCULAR: 2+ radial pulse Brisk capillary refill < 2 sec, fingers warm and well-perfused   ED Treatments / Results  Labs (all labs ordered are listed, but only abnormal results are displayed) Labs Reviewed  VITAMIN D 25 HYDROXY (VIT D DEFICIENCY, FRACTURES)    EKG  EKG Interpretation None       Radiology Dg Chest 2 View  Result Date: 08/12/2017 CLINICAL DATA:  Fall EXAM: CHEST  2 VIEW COMPARISON:  01/21/2014 chest radiograph. FINDINGS: Stable cardiomediastinal silhouette with mild cardiomegaly and aortic atherosclerosis. No pneumothorax. No pleural effusion. Lungs appear clear, with no acute consolidative airspace disease and no pulmonary edema. No displaced fractures in the visualized chest. IMPRESSION: Stable mild cardiomegaly without pulmonary edema. No active pulmonary disease. Electronically Signed   By: Ilona Sorrel M.D.   On: 08/12/2017 12:45   Dg Wrist 2 Views Right  Result Date: 08/12/2017 CLINICAL DATA:  Distal radial and ulnar fractures after fall, status post splinting EXAM: RIGHT WRIST - 2 VIEW COMPARISON:  08/12/2017 at 11:49 a.m. FINDINGS: Relatively nondisplaced fractures of the distal radius and ulnar styloid are again observed although somewhat obscured by the plaster splint. Near-anatomic alignment. Arthropathy in the lateral carpus. IMPRESSION: 1. Interval plaster splinting of the wrist, mildly obscuring the bony detail associated with the distal radial and ulnar nondisplaced fractures. 2. Medial arthropathy in the wrist, unchanged. Electronically Signed   By: Van Clines M.D.   On: 08/12/2017 15:23   Dg Wrist Complete Left  Result Date: 08/12/2017 CLINICAL DATA:  Left wrist pain following a fall. EXAM: LEFT WRIST - COMPLETE 3+ VIEW COMPARISON:  None. FINDINGS: First metacarpal/carpal degenerative changes. Degenerative changes at the articulation of the scaphoid  with the trapezium and trapezoid. No fracture or dislocation seen. IMPRESSION: No fracture or dislocation.  Degenerative changes. Electronically Signed   By: Claudie Revering M.D.   On: 08/12/2017 12:51   Dg Wrist Complete Right  Result Date: 08/12/2017 CLINICAL DATA:  Right wrist pain following a fall. EXAM: RIGHT WRIST - COMPLETE 3+ VIEW COMPARISON:  None. FINDINGS: Mildly comminuted fracture of the distal radial metaphysis and epiphysis, extending into the radiocarpal joint. Mild dorsal angulation of the distal fragment. Essentially nondisplaced fracture of the ulnar styloid. First metacarpal/carpal degenerative changes and degenerative changes at the articulation of the scaphoid with the trapezium and trapezoid. Mild first IP joint degenerative changes. IMPRESSION: 1. Distal radius and ulnar styloid fractures, as described above. 2. Multi joint degenerative changes. Electronically Signed   By: Claudie Revering M.D.   On: 08/12/2017 12:46   Ct Head Wo Contrast  Result Date: 08/12/2017 CLINICAL DATA:  Altered mental status. EXAM: CT HEAD WITHOUT CONTRAST CT CERVICAL SPINE WITHOUT CONTRAST TECHNIQUE: Multidetector CT imaging of the head and cervical spine was performed following the standard protocol without intravenous contrast. Multiplanar CT image reconstructions of the cervical spine were also generated. COMPARISON:  Head CT dated 07/29/2016. Cervical spine CT dated 01/21/2014. FINDINGS: CT HEAD FINDINGS Brain: Diffusely enlarged ventricles and subarachnoid spaces. Patchy white matter low density in both cerebral hemispheres. No intracranial hemorrhage, mass lesion or CT evidence of acute infarction. Vascular: No hyperdense vessel or unexpected calcification. Skull: Normal. Negative for fracture or focal lesion. Sinuses/Orbits: Status post bilateral cataract extraction. Unremarkable paranasal sinuses. Other: Right concha bullosa and deviation of the midportion of the nasal septum to the left. CT CERVICAL SPINE  FINDINGS Alignment: Normal. Skull base and vertebrae: No acute fracture. No primary bone lesion or focal pathologic process. Soft tissues and spinal canal: No prevertebral fluid or swelling. No visible canal hematoma. Disc levels:  Multilevel degenerative changes. Upper chest: Clear lung apices. Other: Dense bilateral carotid artery calcifications. IMPRESSION: 1. No skull fracture or intracranial hemorrhage. 2. No cervical spine fracture or subluxation. 3. Stable moderate diffuse cerebral atrophy and minimal chronic small vessel white matter ischemic changes in both cerebral hemispheres. 4. Multilevel cervical spine degenerative changes. 5. Dense bilateral carotid artery atheromatous calcifications. Electronically Signed   By: Claudie Revering M.D.   On: 08/12/2017 13:14   Ct Cervical Spine Wo Contrast  Result Date: 08/12/2017 CLINICAL DATA:  Altered mental status. EXAM: CT HEAD WITHOUT CONTRAST CT CERVICAL SPINE WITHOUT CONTRAST TECHNIQUE: Multidetector CT imaging of the head and cervical spine was performed following the standard protocol without intravenous contrast. Multiplanar CT image reconstructions of the cervical spine were also generated. COMPARISON:  Head CT  dated 07/29/2016. Cervical spine CT dated 01/21/2014. FINDINGS: CT HEAD FINDINGS Brain: Diffusely enlarged ventricles and subarachnoid spaces. Patchy white matter low density in both cerebral hemispheres. No intracranial hemorrhage, mass lesion or CT evidence of acute infarction. Vascular: No hyperdense vessel or unexpected calcification. Skull: Normal. Negative for fracture or focal lesion. Sinuses/Orbits: Status post bilateral cataract extraction. Unremarkable paranasal sinuses. Other: Right concha bullosa and deviation of the midportion of the nasal septum to the left. CT CERVICAL SPINE FINDINGS Alignment: Normal. Skull base and vertebrae: No acute fracture. No primary bone lesion or focal pathologic process. Soft tissues and spinal canal: No  prevertebral fluid or swelling. No visible canal hematoma. Disc levels:  Multilevel degenerative changes. Upper chest: Clear lung apices. Other: Dense bilateral carotid artery calcifications. IMPRESSION: 1. No skull fracture or intracranial hemorrhage. 2. No cervical spine fracture or subluxation. 3. Stable moderate diffuse cerebral atrophy and minimal chronic small vessel white matter ischemic changes in both cerebral hemispheres. 4. Multilevel cervical spine degenerative changes. 5. Dense bilateral carotid artery atheromatous calcifications. Electronically Signed   By: Claudie Revering M.D.   On: 08/12/2017 13:14   Dg Hand Complete Right  Result Date: 08/12/2017 CLINICAL DATA:  Right wrist and hand pain following a fall. EXAM: RIGHT HAND - COMPLETE 3+ VIEW COMPARISON:  Right wrist radiographs obtained at the same time. FINDINGS: Previously described distal radius and ulnar styloid fractures. Previously described multi joint degenerative changes. No hand fracture or dislocation seen. IMPRESSION: 1. No hand fracture or dislocation. 2. Previously described distal radius and ulnar styloid fractures. 3. Multi joint degenerative changes. Electronically Signed   By: Claudie Revering M.D.   On: 08/12/2017 12:47   Dg Hip Unilat W Or Wo Pelvis 2-3 Views Right  Result Date: 08/12/2017 CLINICAL DATA:  Right hip pain following a fall. EXAM: DG HIP (WITH OR WITHOUT PELVIS) 2-3V RIGHT COMPARISON:  None. FINDINGS: Diffuse osteopenia. No fracture or dislocation seen. Lower thoracic spine degenerative changes. Atheromatous arterial calcifications. IMPRESSION: No fracture or dislocation. Electronically Signed   By: Claudie Revering M.D.   On: 08/12/2017 12:44    Procedures Procedures (including critical care time)  Medications Ordered in ED Medications  lidocaine (PF) (XYLOCAINE) 1 % injection 30 mL (30 mLs Intradermal Given 08/12/17 1411)     Initial Impression / Assessment and Plan / ED Course  I have reviewed the triage  vital signs and the nursing notes.  Pertinent labs & imaging results that were available during my care of the patient were reviewed by me and considered in my medical decision making (see chart for details).     82 yo M with PMHx as above here with mechanical fall and subsequent R wrist, head, and hip pain. Imaging as above. CT Head, C-Spine neg and I do not suspect significant ligamentous injury. Plain films o/w show radial fx. Discussed with Dr. Marcelino Scot who has come to evaluate pt. Fx reduced, splinted. Will d/c with outpt follow-up. Wife present, will driive pt back to SNF. He is o/w well appearing and was at baseline state of health prior to the fall.  Final Clinical Impressions(s) / ED Diagnoses   Final diagnoses:  Closed fracture of distal ends of right radius and ulna, initial encounter  Fall, initial encounter    New Prescriptions Discharge Medication List as of 08/12/2017  3:23 PM    START taking these medications   Details  oxyCODONE (ROXICODONE) 5 MG immediate release tablet Take 0.5 tablets (2.5 mg total) by mouth every 6 (  six) hours as needed for severe pain., Starting Sun 08/12/2017, Print         Duffy Bruce, MD 08/12/17 [phone]

## 2017-08-14 LAB — VITAMIN D 25 HYDROXY (VIT D DEFICIENCY, FRACTURES): VIT D 25 HYDROXY: 33.3 ng/mL (ref 30.0–100.0)

## 2017-08-15 DIAGNOSIS — S52531D Colles' fracture of right radius, subsequent encounter for closed fracture with routine healing: Secondary | ICD-10-CM | POA: Diagnosis not present

## 2017-08-29 DIAGNOSIS — S52531D Colles' fracture of right radius, subsequent encounter for closed fracture with routine healing: Secondary | ICD-10-CM | POA: Diagnosis not present

## 2017-08-30 DIAGNOSIS — D1801 Hemangioma of skin and subcutaneous tissue: Secondary | ICD-10-CM | POA: Diagnosis not present

## 2017-08-30 DIAGNOSIS — Z85828 Personal history of other malignant neoplasm of skin: Secondary | ICD-10-CM | POA: Diagnosis not present

## 2017-08-30 DIAGNOSIS — L821 Other seborrheic keratosis: Secondary | ICD-10-CM | POA: Diagnosis not present

## 2017-08-30 DIAGNOSIS — L570 Actinic keratosis: Secondary | ICD-10-CM | POA: Diagnosis not present

## 2017-08-30 DIAGNOSIS — Z85820 Personal history of malignant melanoma of skin: Secondary | ICD-10-CM | POA: Diagnosis not present

## 2017-08-30 DIAGNOSIS — D171 Benign lipomatous neoplasm of skin and subcutaneous tissue of trunk: Secondary | ICD-10-CM | POA: Diagnosis not present

## 2017-08-30 DIAGNOSIS — L720 Epidermal cyst: Secondary | ICD-10-CM | POA: Diagnosis not present

## 2017-09-05 DIAGNOSIS — S52531D Colles' fracture of right radius, subsequent encounter for closed fracture with routine healing: Secondary | ICD-10-CM | POA: Diagnosis not present

## 2017-09-06 DIAGNOSIS — Z23 Encounter for immunization: Secondary | ICD-10-CM | POA: Diagnosis not present

## 2017-09-12 ENCOUNTER — Encounter: Payer: Self-pay | Admitting: Cardiovascular Disease

## 2017-09-12 ENCOUNTER — Ambulatory Visit (INDEPENDENT_AMBULATORY_CARE_PROVIDER_SITE_OTHER): Payer: Medicare HMO | Admitting: Cardiovascular Disease

## 2017-09-12 VITALS — BP 104/52 | HR 52 | Ht 66.0 in | Wt 159.2 lb

## 2017-09-12 DIAGNOSIS — I482 Chronic atrial fibrillation, unspecified: Secondary | ICD-10-CM

## 2017-09-12 DIAGNOSIS — R69 Illness, unspecified: Secondary | ICD-10-CM | POA: Diagnosis not present

## 2017-09-12 DIAGNOSIS — I2583 Coronary atherosclerosis due to lipid rich plaque: Secondary | ICD-10-CM | POA: Diagnosis not present

## 2017-09-12 DIAGNOSIS — F0390 Unspecified dementia without behavioral disturbance: Secondary | ICD-10-CM

## 2017-09-12 DIAGNOSIS — I2510 Atherosclerotic heart disease of native coronary artery without angina pectoris: Secondary | ICD-10-CM | POA: Diagnosis not present

## 2017-09-12 DIAGNOSIS — I252 Old myocardial infarction: Secondary | ICD-10-CM

## 2017-09-12 DIAGNOSIS — I10 Essential (primary) hypertension: Secondary | ICD-10-CM

## 2017-09-12 NOTE — Progress Notes (Signed)
Patient ID: Joseph Winters, male   DOB: 12-22-1934, 82 y.o.   MRN: [id number]    PCP: Dr. Lavone Winters  HPI: Joseph Winters is a 82 y.o. male who presents to the office today for a 7 month follow up cardiology evaluation.  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 Due to concomitant CAD, he underwent staged intervention to his LAD several days later. His last intervention was in June 1994 for in-stent restenosis at both sites and he underwent repeat PTCA.    A nuclear perfusion study in September 2013 was unchanged from previously and showed moderate to severe inferior scar without associated ischemia and his ejection fraction post stress was 43%. He has a history of hypertension as well as hyperlipidemia.   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 He was hospitalized and was found to have subarachnoid hemorrhage and small contusion. He did experience some confusion initially which resolved. He has seen Dr. Sherley Winters from neurosurgery. 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 There is a small volume of scattered subarachnoid hemorrhage, small left middle cranial fossa subdural hemorrhage, and probable left temporal lobe hemorrhagic contusion with an associated nondisplaced right occipital and parietal skull fracture.  When I saw him in March 2015 he had noted some forgetfulness and some confusion intermittently. He underwent another CT scan which suggested acute to subacute on chronic right subdural hematoma with mild mass effect which was new. He did not have significant midline shift. There is resolution of prior parenchymal contusions and subarachnoid hemorrhage on the left. He did see Joseph Winters in followup and a head CT demonstrated improvement.   In March 2015, he was found to be in atrial fibrillation when he presented for his echo Doppler study.  Ejection fraction was 35-40%. A nuclear perfusion study showed inferior akinesis, and an ejection fraction of 40% with prior fixed, inferior, inferoseptal scar, without associated ischemia. Due to his prior CNS bleed, he is not a candidate for anticoagulation. His atrial fibrillation rate has been controlled with Toprol-XL 125 mg. I reduced his aspirin to 81 mg daily. He has been on  Toprol-XL 125 g, doxazocin 2 mg, Caduet 5/10 and ramipril10 mg for hypertension. He is tolerated the atorvastatin in Caduet with reference to his hyperlipidemia.  He has been undergoing cognitive rehabilitation. He was reevaluated at [facility] by his neurologist.  He will also be undergoing further evaluation at [facility] memory clinic.  When I last saw him, he had fallen while at [employer].  His wife had gone to get the car and at that time he was holding onto the carriage which then dropped off the curb, he lost his balance and fell.  He was evaluated in the emergency room on 07/29/2016 and  required 5 sutures .  The laceration of the left eyebrow.  CT scan was negative for intracranial injury.  He denied any associated chest pain or dizziness prior to losing his balance when the cart had fallen off the curb.   Since I last saw him in March 2018.  He completed his evaluation at [facility].  He apparently has been living since June 2018 at [facility] memory unit which is a locked facility.  Recently, he had fallen after tripping and fractured his right wrist.  He was cared for by Dr. Marcelino Winters and now has a hard cast.  Recently,  he denies any episodes of chest pain or shortness of breath.  Apparently his appetite was not very good and at this memory center.  He was started on ensure supplementation.  He has seen his primary physician, Dr. Griffin within the past month and reportedly complete blood work was obtained.  The patient is unaware of palpitations, and denies recent presyncope or syncope.  He  presents for evaluation.  Past Medical History:  Diagnosis Date  . Alzheimer disease   . Atrial fibrillation (HCC)   . Coronary artery disease   . Dysrhythmia 2009   24hr holter-PVC'S  . Hypertension 08/28/2008   Echo EF 45-50%  . Myocardial infarction (HCC) 1993  . Subdural hemorrhage (HCC)     Past Surgical History:  Procedure Laterality Date  . CORONARY ANGIOPLASTY     initial inferior MI 1993. 1994 Repeat PTCA    Allergies  Allergen Reactions  . Memantine     Other reaction(s): Confusion (intolerance) Made mental status worse    Current Outpatient Prescriptions  Medication Sig Dispense Refill  . acetaminophen (TYLENOL) 500 MG tablet Take 1-2 tablets (500-1,000 mg total) by mouth every 6 (six) hours as needed for mild pain or moderate pain. 30 tablet 0  . amLODipine-atorvastatin (CADUET) 5-10 MG tablet Take 1 tablet by mouth daily. 90 tablet 2  . aspirin 81 MG tablet Take 81 mg by mouth daily.    . donepezil (ARICEPT) 10 MG tablet Take 10 mg by mouth at bedtime.    . doxazosin (CARDURA) 1 MG tablet Take 1 tablet (1 mg total) by mouth daily. 90 tablet 2  . isosorbide mononitrate (IMDUR) 60 MG 24 hr tablet Take 1 tablet (60 mg total) by mouth daily. 90 tablet 2  . Metoprolol Succinate 50 MG CS24 Take 0.5 tablets by mouth daily.    . Multiple Vitamins-Minerals (OCUVITE ADULT FORMULA PO) Take 1 capsule by mouth daily.    . ramipril (ALTACE) 10 MG capsule Take 1 capsule (10 mg total) by mouth daily. 90 capsule 2   No current facility-administered medications for this visit.     Social History   Social History  . Marital status: Married    Spouse name: N/A  . Number of children: N/A  . Years of education: N/A   Occupational History  . Not on file.   Social History Main Topics  . Smoking status: Former Smoker    Types: Cigarettes    Quit date: 12/11/1985  . Smokeless tobacco: Never Used  . Alcohol use Yes  . Drug use: No  . Sexual activity: Not on file   Other  Topics Concern  . Not on file   Social History Narrative  . No narrative on file   Family history is notable that both parents are deceased. No family history on file.  ROS General: Negative; No fevers, chills, or night sweats HEENT: Negative; No changes in vision or hearing, sinus congestion, difficulty swallowing Pulmonary: Negative; No cough, wheezing, shortness of breath, hemoptysis Cardiovascular: See HPI:  GI: Negative; No nausea, vomiting, diarrhea, or abdominal pain GU: Negative; No dysuria, hematuria, or difficulty voiding Musculoskeletal: Status post right wrist fracture. Hematologic: Negative; no easy bruising, bleeding Endocrine: Negative; no heat/cold intolerance; no diabetes, Neuro: Progressive Alzheimer's  Dementia Skin: Negative; No rashes or skin lesions Psychiatric: Negative; No behavioral problems, depression Sleep: Negative; No snoring,  daytime sleepiness, hypersomnolence, bruxism, restless legs, hypnogognic hallucinations. Other comprehensive 14 point system review is negative   Physical Exam BP (!) 104/52     Pulse (!) 52   Ht 5' 6" (1.676 m)   Wt 159 lb 3.2 oz (72.2 kg)   BMI 25.70 kg/m    The blood pressure by me was 108/60  Wt Readings from Last 3 Encounters:  09/12/17 159 lb 3.2 oz (72.2 kg)  02/27/17 176 lb (79.8 kg)  08/22/16 174 lb (78.9 kg)   General: Alert, oriented, no distress.  Skin: normal turgor, no rashes, warm and dry HEENT: Normocephalic, atraumatic. Pupils equal round and reactive to light; sclera anicteric; extraocular muscles intact;  Nose without nasal septal hypertrophy Mouth/Parynx benign; Mallinpatti scale 3 Neck: No JVD, no carotid bruits; normal carotid upstroke Lungs: clear to ausculatation and percussion; no wheezing or rales Chest wall: without tenderness to palpitation Heart: PMI not displaced, RRR, s1 s2 normal, 1/6 systolic murmur, no diastolic murmur, no rubs, gallops, thrills, or heaves Abdomen: soft, nontender;  no hepatosplenomehaly, BS+; abdominal aorta nontender and not dilated by palpation. Back: no CVA tenderness Pulses 2+ Musculoskeletal: Cast on right forearm Extremities: no clubbing cyanosis or edema, Homan's sign negative  Neurologic: grossly nonfocal; Cranial nerves grossly wnl Psychologic: Decreased affect.   ECG (independently read by me): Atrial fibrillation with a slow ventricular response in the 50s.  Old inferior MI with inferior Q waves.  Poor anterior R-wave progression.  QTc interval 446 ms.  March 2018 ECG (independently read by me): Atrial fibrillation at 50 bpm.  Left bundle branch block.  Old inferior MI Q waves  September 2017 ECG (independently read by me): Atrial fibrillation with ventricular response at 51 bpm.  Nonspecific interventricular block.  Poor anterior R-wave progression.  Inferior Q waves.  May 2017 ECG (independently read by me): Atrial fibrillation with a reduced ventricular response at 42 bpm.  Her anterior R-wave progression V1 through V4.  Incomplete left bundle branch block with inferolateral T wave abnormality.  October 2016 ECG (independently read by me): Atrial fibrillation at 60 bpm.  Nonspecific interventricular conduction delay.  Old inferior Q waves.  Poor anterior R-wave progression.  QTc interval 442 ms.  June 2016 ECG (independently read by me):  Atrial fibrillation with a controlled ventricular response in the 60s.  Left bundle branch block with repolarization changes.  December 2015ECG (independently read by me): Atrial fibrillation. Ventricular rate averaging 65 bpm. Old inferior infarction. Poor R wave progression. PVC.   June 2015 ECG today (independently read by me): Atrial fibrillation with a controlled ventricular rate at 64 beats per minute. Old Q waves in leads 3 and aVF. Incomplete left bundle branch block.   ECG 04/02/14 (independently read by me): Atrial fibrillation with ventricular rate at 60. Old inferior Q waves in leads 3 and  aVF. Incomplete left bundle branch block   Prior 02/18/2014 ECG (independently read by me): Sinus bradycardia 52 beats per minute with a PAC. There also is T wave inversion in leads V4 through V6 as well as lead 2 and 3. There is left axis deviation. In comparison to his prior ECG one year previously in our office his T wave changes in V4 through V6 are more pronounced.    LABS:  BMP Latest Ref Rng & Units 07/29/2016 05/04/2016 01/21/2014  Glucose 65 - 99 mg/dL 96 88 109(H)  BUN 6 - 20 mg/dL 11 10 12  Creatinine 0.61 - 1.24 mg/dL 0.82 0.84 0.90  Sodium 135 - 145 mmol/L 130(L) 134(L) 133(L)  Potassium 3.5 - 5.1 mmol/L 3.4(L) 4.1 3.9  Chloride 101 - 111 mmol/L 97(L) 99 94(L)  CO2   22 - 32 mmol/L 24 25 -  Calcium 8.9 - 10.3 mg/dL 8.9 8.9 -    Hepatic Function Latest Ref Rng & Units 05/04/2016 01/21/2014  Total Protein 6.1 - 8.1 g/dL 6.4 6.6  Albumin 3.6 - 5.1 g/dL 4.2 4.0  AST 10 - 35 U/L 24 24  ALT 9 - 46 U/L 23 19  Alk Phosphatase 40 - 115 U/L 54 53  Total Bilirubin 0.2 - 1.2 mg/dL 1.1 0.6    CBC Latest Ref Rng & Units 07/29/2016 05/04/2016 01/21/2014  WBC 4.0 - 10.5 K/uL 7.3 5.6 -  Hemoglobin 13.0 - 17.0 g/dL 13.9 14.8 14.3  Hematocrit 39.0 - 52.0 % 40.6 43.9 42.0  Platelets 150 - 400 K/uL 203 215 -   Lab Results  Component Value Date   MCV 90.6 07/29/2016   MCV 89.8 05/04/2016   MCV 88.5 01/21/2014    Lab Results  Component Value Date   TSH 2.27 05/04/2016    BNP No results found for: BNP  ProBNP No results found for: PROBNP   Lipid Panel     Component Value Date/Time   CHOL 151 05/04/2016 1020   TRIG 52 05/04/2016 1020   HDL 84 05/04/2016 1020   CHOLHDL 1.8 05/04/2016 1020   VLDL 10 05/04/2016 1020   LDLCALC 57 05/04/2016 1020     RADIOLOGY: No results found.  IMPRESSION:  1. Coronary artery disease due to lipid rich plaque   2. Essential hypertension   3. Chronic atrial fibrillation ([place])   4. Old MI (myocardial infarction)   5. Dementia without  behavioral disturbance, unspecified dementia type     ASSESSMENT AND PLAN: Joseph Winters is an 89 -year-old gentleman who is 25 years status post inferolateral wall MI treated with PCI to his RCA and stage intervention to his LAD in November 1993. He developed in-stent restenosis one year later in June 1994 but has been stable with reference to his CAD.  His last nuclear study showed inferior scar with an EF of 40%. He has permanent atrial fibrillation.  Over the last several evaluations  I have reduced his Toprol dose.  Presently, he has been on 50 mg daily in addition to amlodipine/atorvastatin 5/10, doxazosin  1 mg, ramipril 10 mg and isosorbide 60 mg.  He has been without anginal symptoms.  He has had several falls over the years, but the most recent one was result of tripping.  His ECG reveals his permanent atrial fibrillation.  The ventricular rate is slow at 52.  I'm further reducing Toprol from 50 mg daily down to 25 mg daily.  He will monitor his blood pressure.  If his blood pressure continues to be low, we may need to discontinue Dr. Brynda Rim in and possibly reduce ramipril down to 5 mg.  He had recent blood work done by Dr. Laurann Montana.  I will try to obtain these results.  His wife brought with him to the office today a copy of his original living will as well as general power of attorney.  We will get the scanned and put into Epic.  The patient believes he is not eating as much now because they're giving him to ensure cans a which is making him full and as result, he is not eating his normal food as much.  Perhaps it may be possible to reduce and sure if he will eat more of his regular table food.  I will see him in 6 months for reevaluation. Troy Sine, MD, [facility]  09/12/2017 3:49 PM

## 2017-09-12 NOTE — Patient Instructions (Signed)
Medication Instructions:  DECREASE metoprolol to 25mg  (1/2 tablet) daily  Follow-Up: Your physician wants you to follow-up in: 6 MONTHS with Dr. Claiborne Billings. You will receive a reminder letter in the mail two months in advance. If you don't receive a letter, please call our office to schedule the follow-up appointment.   Any Other Special Instructions Will Be Listed Below (If Applicable).     If you need a refill on your cardiac medications before your next appointment, please call your pharmacy.

## 2017-09-17 ENCOUNTER — Emergency Department (HOSPITAL_COMMUNITY)
Admission: EM | Admit: 2017-09-17 | Discharge: 2017-09-17 | Disposition: A | Discharge status: Home / Self Care | Payer: Medicare HMO | Attending: Emergency Medicine | Admitting: Emergency Medicine

## 2017-09-17 ENCOUNTER — Encounter (HOSPITAL_COMMUNITY): Payer: Self-pay | Admitting: *Deleted

## 2017-09-17 ENCOUNTER — Emergency Department (HOSPITAL_COMMUNITY): Payer: Medicare HMO

## 2017-09-17 DIAGNOSIS — Y92129 Unspecified place in nursing home as the place of occurrence of the external cause: Secondary | ICD-10-CM | POA: Insufficient documentation

## 2017-09-17 DIAGNOSIS — Z955 Presence of coronary angioplasty implant and graft: Secondary | ICD-10-CM | POA: Insufficient documentation

## 2017-09-17 DIAGNOSIS — W050XXA Fall from non-moving wheelchair, initial encounter: Secondary | ICD-10-CM | POA: Insufficient documentation

## 2017-09-17 DIAGNOSIS — G309 Alzheimer's disease, unspecified: Secondary | ICD-10-CM | POA: Diagnosis not present

## 2017-09-17 DIAGNOSIS — S0083XA Contusion of other part of head, initial encounter: Secondary | ICD-10-CM | POA: Insufficient documentation

## 2017-09-17 DIAGNOSIS — S0990XA Unspecified injury of head, initial encounter: Secondary | ICD-10-CM | POA: Diagnosis not present

## 2017-09-17 DIAGNOSIS — I252 Old myocardial infarction: Secondary | ICD-10-CM | POA: Insufficient documentation

## 2017-09-17 DIAGNOSIS — S0181XA Laceration without foreign body of other part of head, initial encounter: Secondary | ICD-10-CM | POA: Diagnosis not present

## 2017-09-17 DIAGNOSIS — Z87891 Personal history of nicotine dependence: Secondary | ICD-10-CM | POA: Diagnosis not present

## 2017-09-17 DIAGNOSIS — Z79899 Other long term (current) drug therapy: Secondary | ICD-10-CM | POA: Insufficient documentation

## 2017-09-17 DIAGNOSIS — Y999 Unspecified external cause status: Secondary | ICD-10-CM | POA: Insufficient documentation

## 2017-09-17 DIAGNOSIS — I480 Paroxysmal atrial fibrillation: Secondary | ICD-10-CM | POA: Insufficient documentation

## 2017-09-17 DIAGNOSIS — W19XXXA Unspecified fall, initial encounter: Secondary | ICD-10-CM

## 2017-09-17 DIAGNOSIS — Y939 Activity, unspecified: Secondary | ICD-10-CM | POA: Insufficient documentation

## 2017-09-17 DIAGNOSIS — I2510 Atherosclerotic heart disease of native coronary artery without angina pectoris: Secondary | ICD-10-CM | POA: Diagnosis not present

## 2017-09-17 DIAGNOSIS — S0031XA Abrasion of nose, initial encounter: Secondary | ICD-10-CM | POA: Diagnosis not present

## 2017-09-17 DIAGNOSIS — I10 Essential (primary) hypertension: Secondary | ICD-10-CM | POA: Insufficient documentation

## 2017-09-17 DIAGNOSIS — S199XXA Unspecified injury of neck, initial encounter: Secondary | ICD-10-CM | POA: Diagnosis not present

## 2017-09-17 DIAGNOSIS — S0993XA Unspecified injury of face, initial encounter: Secondary | ICD-10-CM | POA: Diagnosis not present

## 2017-09-17 DIAGNOSIS — S0081XA Abrasion of other part of head, initial encounter: Secondary | ICD-10-CM | POA: Diagnosis not present

## 2017-09-17 NOTE — Discharge Instructions (Signed)
Tylenol for pain.   Clean abrasions once or twice a day with soap and water. Follow-up as needed

## 2017-09-17 NOTE — ED Triage Notes (Signed)
Per EMS, pt from [facility] memory care unit. Pt was found by staff sitting upright in his chair with a bruise on his face. Staff are concerned pt fell, pt has blood around nose. Pt is at baseline mental status, pt cannot recall fall but has dementia. Pt denies pain and is able to move extremities without difficulty, no pain upon palpation to face.   EMS EKG unremarkable. Pt has hx of afib, was afib on monitor CBG 126 BP 148/79 HR 60s, afib

## 2017-09-17 NOTE — ED Notes (Signed)
Bed: [facility] Expected date:  Expected time:  Means of arrival:  Comments: 

## 2017-09-17 NOTE — ED Provider Notes (Signed)
[facility] DEPT Provider Note   CSN: [id number] Arrival date & time: 09/17/17  0931     History   Chief Complaint Chief Complaint  Patient presents with  . Fall    HPI Joseph Winters is a 82 y.o. male.  Patient fell out of the wheelchair and hit his face.   No loss of consciousness.   The history is provided by the nursing home. No language interpreter was used.  Fall  This is a new problem. The current episode started 3 to 5 hours ago. The problem occurs constantly. The problem has been resolved. Pertinent negatives include no chest pain, no abdominal pain and no headaches. Nothing aggravates the symptoms.    Past Medical History:  Diagnosis Date  . Alzheimer disease   . Atrial fibrillation ([place])   . Coronary artery disease   . Dysrhythmia 2009   24hr holter-PVC'S  . Hypertension 08/28/2008   Echo EF 45-50%  . Myocardial infarction ([place]) 1993  . Subdural hemorrhage [facility])     Patient Active Problem List   Diagnosis Date Noted  . Chronic atrial fibrillation ([place]) 11/15/2014  . Subdural hematoma ([place]) 04/04/2014  . PAF (paroxysmal atrial fibrillation), new from 01/2014 rate controlled.  03/06/2014  . Old MI (myocardial infarction) 03/01/2014  . CAD (coronary artery disease) 03/01/2014  . HTN (hypertension) 03/01/2014  . Hyperlipidemia with target LDL less than 70 03/01/2014  . Closed head injury 01/21/2014    Past Surgical History:  Procedure Laterality Date  . CORONARY ANGIOPLASTY     initial inferior MI [place] Repeat PTCA       Home Medications    Prior to Admission medications   Medication Sig Start Date End Date Taking? Authorizing Provider  acetaminophen (TYLENOL) 500 MG tablet Take 1-2 tablets (500-1,000 mg total) by mouth every 6 (six) hours as needed for mild pain or moderate pain. 08/12/17  Yes Duffy Bruce, MD  amLODipine-atorvastatin (CADUET) 5-10 MG tablet Take 1 tablet by mouth daily. 01/12/17  Yes Troy Sine, MD  aspirin 81 MG  tablet Take 81 mg by mouth at bedtime.    Yes [provider]  donepezil (ARICEPT) 10 MG tablet Take 10 mg by mouth at bedtime.   Yes [provider]  doxazosin (CARDURA) 1 MG tablet Take 1 tablet (1 mg total) by mouth daily. 01/12/17  Yes Troy Sine, MD  ENSURE (ENSURE) Take 237 mLs by mouth 2 (two) times daily between meals.   Yes [provider]  isosorbide mononitrate (IMDUR) 60 MG 24 hr tablet Take 1 tablet (60 mg total) by mouth daily. 01/12/17  Yes Troy Sine, MD  Metoprolol Succinate 50 MG CS24 Take 25 mg by mouth daily.    Yes [provider]  Multiple Vitamins-Minerals (OCUVITE ADULT FORMULA PO) Take 1 capsule by mouth daily.   Yes [provider]  ramipril (ALTACE) 10 MG capsule Take 1 capsule (10 mg total) by mouth daily. 01/12/17  Yes Troy Sine, MD    Family History No family history on file.  Social History Social History  Substance Use Topics  . Smoking status: Former Smoker    Types: Cigarettes    Quit date: 12/11/1985  . Smokeless tobacco: Never Used  . Alcohol use Yes     Allergies   Memantine   Review of Systems Review of Systems  Constitutional: Negative for appetite change and fatigue.  HENT: Negative for congestion, ear discharge and sinus pressure.   Eyes: Negative  for discharge.  Respiratory: Negative for cough.   Cardiovascular: Negative for chest pain.  Gastrointestinal: Negative for abdominal pain and diarrhea.  Genitourinary: Negative for frequency and hematuria.  Musculoskeletal: Negative for back pain.  Skin: Negative for rash.  Neurological: Negative for seizures and headaches.  Psychiatric/Behavioral: Negative for hallucinations.     Physical Exam Updated Vital Signs BP (!) 92/56 (BP Location: Left Arm)   Pulse 65   Temp 98.8 F (37.1 C) (Oral)   Resp 18   SpO2 100%   Physical Exam  Constitutional: He is oriented to person, place, and time. He appears well-developed.  HENT:    Head: Normocephalic.  Patient has abrasions and swelling right side of face and nose  Eyes: Conjunctivae and EOM are normal. No scleral icterus.  Neck: Neck supple. No thyromegaly present.  Cardiovascular: Normal rate and regular rhythm.  Exam reveals no gallop and no friction rub.   No murmur heard. Pulmonary/Chest: No stridor. He has no wheezes. He has no rales. He exhibits no tenderness.  Abdominal: He exhibits no distension. There is no tenderness. There is no rebound.  Musculoskeletal: Normal range of motion. He exhibits no edema.  Lymphadenopathy:    He has no cervical adenopathy.  Neurological: He is oriented to person, place, and time. He exhibits normal muscle tone. Coordination normal.  Skin: No rash noted. No erythema.  Psychiatric: He has a normal mood and affect. His behavior is normal.     ED Treatments / Results  Labs (all labs ordered are listed, but only abnormal results are displayed) Labs Reviewed - No data to display  EKG  EKG Interpretation None       Radiology Ct Head Wo Contrast  Result Date: 09/17/2017 CLINICAL DATA:  Found with unexplained left facial bruising. Concern for unwitnessed fall. Blood around the nose. Baseline mental status which is abnormal due to dementia. Initial encounter. EXAM: CT HEAD WITHOUT CONTRAST CT MAXILLOFACIAL WITHOUT CONTRAST CT CERVICAL SPINE WITHOUT CONTRAST TECHNIQUE: Multidetector CT imaging of the head, cervical spine, and maxillofacial structures were performed using the standard protocol without intravenous contrast. Multiplanar CT image reconstructions of the cervical spine and maxillofacial structures were also generated. COMPARISON:  08/12/2017 FINDINGS: CT HEAD FINDINGS Brain: No evidence of acute infarction, hemorrhage, hydrocephalus, extra-axial collection or mass lesion/mass effect. There is advanced brain atrophy, including the medial temporal lobes that correlates with history of Alzheimer's disease. Posttraumatic  gliosis in the left temporal pole and inferior left temporal lobe. See head CTs from 2015 . Vascular: Atherosclerotic calcification.  No hyperdense vessel. Skull: Chronic right parietal scalp reticulation and volume loss consistent with scarring. Negative for fracture. CT MAXILLOFACIAL FINDINGS Osseous: Negative for fracture or mandibular dislocation. Orbits: No evidence of injury.  Bilateral cataract resection. Sinuses: Negative.  No hemosinus or fluid levels. Soft tissues: No visible contusion or opaque foreign body. CT CERVICAL SPINE FINDINGS Alignment: No traumatic malalignment. Chronic mild degenerative anterolisthesis at C5-6 and C6-7. Skull base and vertebrae: Negative for acute fracture. Soft tissues and spinal canal: No prevertebral fluid or swelling. No visible canal hematoma. Disc levels: Diffuse facet arthropathy with asymmetric bulky hypertrophy on the left. Mild generalized disc degeneration for age. Upper chest: No acute finding. Large dermal inclusion cyst in the midline upper back. Mild emphysematous changes at the apices. IMPRESSION: 1. No evidence of acute intracranial injury. Negative for facial or cervical spine fracture. 2. Advanced atrophy in keeping with history of Alzheimer's disease. 3. Posttraumatic left temporal gliosis. Electronically Signed   By:  Monte Fantasia M.D.   On: 09/17/2017 11:07   Ct Cervical Spine Wo Contrast  Result Date: 09/17/2017 CLINICAL DATA:  Found with unexplained left facial bruising. Concern for unwitnessed fall. Blood around the nose. Baseline mental status which is abnormal due to dementia. Initial encounter. EXAM: CT HEAD WITHOUT CONTRAST CT MAXILLOFACIAL WITHOUT CONTRAST CT CERVICAL SPINE WITHOUT CONTRAST TECHNIQUE: Multidetector CT imaging of the head, cervical spine, and maxillofacial structures were performed using the standard protocol without intravenous contrast. Multiplanar CT image reconstructions of the cervical spine and maxillofacial structures  were also generated. COMPARISON:  08/12/2017 FINDINGS: CT HEAD FINDINGS Brain: No evidence of acute infarction, hemorrhage, hydrocephalus, extra-axial collection or mass lesion/mass effect. There is advanced brain atrophy, including the medial temporal lobes that correlates with history of Alzheimer's disease. Posttraumatic gliosis in the left temporal pole and inferior left temporal lobe. See head CTs from 2015 . Vascular: Atherosclerotic calcification.  No hyperdense vessel. Skull: Chronic right parietal scalp reticulation and volume loss consistent with scarring. Negative for fracture. CT MAXILLOFACIAL FINDINGS Osseous: Negative for fracture or mandibular dislocation. Orbits: No evidence of injury.  Bilateral cataract resection. Sinuses: Negative.  No hemosinus or fluid levels. Soft tissues: No visible contusion or opaque foreign body. CT CERVICAL SPINE FINDINGS Alignment: No traumatic malalignment. Chronic mild degenerative anterolisthesis at C5-6 and C6-7. Skull base and vertebrae: Negative for acute fracture. Soft tissues and spinal canal: No prevertebral fluid or swelling. No visible canal hematoma. Disc levels: Diffuse facet arthropathy with asymmetric bulky hypertrophy on the left. Mild generalized disc degeneration for age. Upper chest: No acute finding. Large dermal inclusion cyst in the midline upper back. Mild emphysematous changes at the apices. IMPRESSION: 1. No evidence of acute intracranial injury. Negative for facial or cervical spine fracture. 2. Advanced atrophy in keeping with history of Alzheimer's disease. 3. Posttraumatic left temporal gliosis. Electronically Signed   By: Monte Fantasia M.D.   On: 09/17/2017 11:07   Ct Maxillofacial Wo Contrast  Result Date: 09/17/2017 CLINICAL DATA:  Found with unexplained left facial bruising. Concern for unwitnessed fall. Blood around the nose. Baseline mental status which is abnormal due to dementia. Initial encounter. EXAM: CT HEAD WITHOUT CONTRAST  CT MAXILLOFACIAL WITHOUT CONTRAST CT CERVICAL SPINE WITHOUT CONTRAST TECHNIQUE: Multidetector CT imaging of the head, cervical spine, and maxillofacial structures were performed using the standard protocol without intravenous contrast. Multiplanar CT image reconstructions of the cervical spine and maxillofacial structures were also generated. COMPARISON:  08/12/2017 FINDINGS: CT HEAD FINDINGS Brain: No evidence of acute infarction, hemorrhage, hydrocephalus, extra-axial collection or mass lesion/mass effect. There is advanced brain atrophy, including the medial temporal lobes that correlates with history of Alzheimer's disease. Posttraumatic gliosis in the left temporal pole and inferior left temporal lobe. See head CTs from 2015 . Vascular: Atherosclerotic calcification.  No hyperdense vessel. Skull: Chronic right parietal scalp reticulation and volume loss consistent with scarring. Negative for fracture. CT MAXILLOFACIAL FINDINGS Osseous: Negative for fracture or mandibular dislocation. Orbits: No evidence of injury.  Bilateral cataract resection. Sinuses: Negative.  No hemosinus or fluid levels. Soft tissues: No visible contusion or opaque foreign body. CT CERVICAL SPINE FINDINGS Alignment: No traumatic malalignment. Chronic mild degenerative anterolisthesis at C5-6 and C6-7. Skull base and vertebrae: Negative for acute fracture. Soft tissues and spinal canal: No prevertebral fluid or swelling. No visible canal hematoma. Disc levels: Diffuse facet arthropathy with asymmetric bulky hypertrophy on the left. Mild generalized disc degeneration for age. Upper chest: No acute finding. Large dermal inclusion cyst in  the midline upper back. Mild emphysematous changes at the apices. IMPRESSION: 1. No evidence of acute intracranial injury. Negative for facial or cervical spine fracture. 2. Advanced atrophy in keeping with history of Alzheimer's disease. 3. Posttraumatic left temporal gliosis. Electronically Signed   By:  Monte Fantasia M.D.   On: 09/17/2017 11:07    Procedures Procedures (including critical care time)  Medications Ordered in ED Medications - No data to display   Initial Impression / Assessment and Plan / ED Course  I have reviewed the triage vital signs and the nursing notes.  Pertinent labs & imaging results that were available during my care of the patient were reviewed by me and considered in my medical decision making (see chart for details).      CT scan of head, face and neck are negative. Patient with fall and contusion to right side of face. He will take Tylenol for pain and follow-up as needed  Final Clinical Impressions(s) / ED Diagnoses   Final diagnoses:  Fall, initial encounter    New Prescriptions New Prescriptions   No medications on file     Milton Ferguson, MD 09/17/17 1116

## 2017-09-18 ENCOUNTER — Encounter: Payer: Self-pay | Admitting: Cardiovascular Disease

## 2017-09-19 ENCOUNTER — Encounter: Payer: Self-pay | Admitting: Cardiovascular Disease

## 2017-09-19 ENCOUNTER — Other Ambulatory Visit: Payer: Self-pay | Admitting: *Deleted

## 2017-09-19 DIAGNOSIS — S52531D Colles' fracture of right radius, subsequent encounter for closed fracture with routine healing: Secondary | ICD-10-CM | POA: Diagnosis not present

## 2017-09-19 MED ORDER — RAMIPRIL 10 MG PO CAPS: 10.0000 mg | ORAL_CAPSULE | Freq: Every day | ORAL | 2 refills | Status: DC

## 2017-09-19 MED ORDER — AMLODIPINE-ATORVASTATIN 5-10 MG PO TABS: 1.0000 | ORAL_TABLET | Freq: Every day | ORAL | 2 refills | Status: DC

## 2017-09-19 MED ORDER — ISOSORBIDE MONONITRATE ER 60 MG PO TB24: 60.0000 mg | ORAL_TABLET | Freq: Every day | ORAL | 2 refills | Status: DC

## 2017-09-19 MED ORDER — DOXAZOSIN MESYLATE 1 MG PO TABS: 1.0000 mg | ORAL_TABLET | Freq: Every day | ORAL | 2 refills | Status: DC

## 2017-09-19 MED ORDER — METOPROLOL SUCCINATE 50 MG PO CS24: 25.0000 mg | EXTENDED_RELEASE_CAPSULE | Freq: Every day | ORAL | 3 refills | Status: DC

## 2017-09-24 ENCOUNTER — Encounter: Payer: Self-pay | Admitting: Cardiovascular Disease

## 2017-09-27 MED ORDER — ATORVASTATIN CALCIUM 10 MG PO TABS: 10.0000 mg | ORAL_TABLET | Freq: Every day | ORAL | 1 refills | Status: DC

## 2017-09-27 MED ORDER — AMLODIPINE BESYLATE 2.5 MG PO TABS: 2.5000 mg | ORAL_TABLET | Freq: Every day | ORAL | 1 refills | Status: DC

## 2017-10-03 DIAGNOSIS — S52531D Colles' fracture of right radius, subsequent encounter for closed fracture with routine healing: Secondary | ICD-10-CM | POA: Diagnosis not present

## 2017-10-24 DIAGNOSIS — S52531D Colles' fracture of right radius, subsequent encounter for closed fracture with routine healing: Secondary | ICD-10-CM | POA: Diagnosis not present

## 2017-10-26 DIAGNOSIS — I25119 Atherosclerotic heart disease of native coronary artery with unspecified angina pectoris: Secondary | ICD-10-CM | POA: Diagnosis not present

## 2017-10-26 DIAGNOSIS — I4891 Unspecified atrial fibrillation: Secondary | ICD-10-CM | POA: Diagnosis not present

## 2017-10-26 DIAGNOSIS — R69 Illness, unspecified: Secondary | ICD-10-CM | POA: Diagnosis not present

## 2017-10-26 DIAGNOSIS — N400 Enlarged prostate without lower urinary tract symptoms: Secondary | ICD-10-CM | POA: Diagnosis not present

## 2017-10-26 DIAGNOSIS — I10 Essential (primary) hypertension: Secondary | ICD-10-CM | POA: Diagnosis not present

## 2017-10-27 DIAGNOSIS — N390 Urinary tract infection, site not specified: Secondary | ICD-10-CM | POA: Diagnosis not present

## 2017-11-16 ENCOUNTER — Telehealth: Payer: Self-pay | Admitting: *Deleted

## 2017-11-16 NOTE — Telephone Encounter (Signed)
Received BP readings from [facility].  Dr. Claiborne Billings reviewed and no changes recommended at this time.

## 2017-11-24 DIAGNOSIS — S90111A Contusion of right great toe without damage to nail, initial encounter: Secondary | ICD-10-CM | POA: Diagnosis not present

## 2017-11-28 DIAGNOSIS — S52531D Colles' fracture of right radius, subsequent encounter for closed fracture with routine healing: Secondary | ICD-10-CM | POA: Diagnosis not present

## 2017-11-29 DIAGNOSIS — I2510 Atherosclerotic heart disease of native coronary artery without angina pectoris: Secondary | ICD-10-CM | POA: Diagnosis not present

## 2017-11-29 DIAGNOSIS — R41841 Cognitive communication deficit: Secondary | ICD-10-CM | POA: Diagnosis not present

## 2017-11-29 DIAGNOSIS — R2689 Other abnormalities of gait and mobility: Secondary | ICD-10-CM | POA: Diagnosis not present

## 2017-11-29 DIAGNOSIS — M6281 Muscle weakness (generalized): Secondary | ICD-10-CM | POA: Diagnosis not present

## 2017-11-29 DIAGNOSIS — I4891 Unspecified atrial fibrillation: Secondary | ICD-10-CM | POA: Diagnosis not present

## 2017-11-29 DIAGNOSIS — G309 Alzheimer's disease, unspecified: Secondary | ICD-10-CM | POA: Diagnosis not present

## 2017-11-29 DIAGNOSIS — E785 Hyperlipidemia, unspecified: Secondary | ICD-10-CM | POA: Diagnosis not present

## 2017-11-30 DIAGNOSIS — R2689 Other abnormalities of gait and mobility: Secondary | ICD-10-CM | POA: Diagnosis not present

## 2017-11-30 DIAGNOSIS — M6281 Muscle weakness (generalized): Secondary | ICD-10-CM | POA: Diagnosis not present

## 2017-11-30 DIAGNOSIS — I2510 Atherosclerotic heart disease of native coronary artery without angina pectoris: Secondary | ICD-10-CM | POA: Diagnosis not present

## 2017-11-30 DIAGNOSIS — G309 Alzheimer's disease, unspecified: Secondary | ICD-10-CM | POA: Diagnosis not present

## 2017-11-30 DIAGNOSIS — I4891 Unspecified atrial fibrillation: Secondary | ICD-10-CM | POA: Diagnosis not present

## 2017-12-05 DIAGNOSIS — M6281 Muscle weakness (generalized): Secondary | ICD-10-CM | POA: Diagnosis not present

## 2017-12-05 DIAGNOSIS — I4891 Unspecified atrial fibrillation: Secondary | ICD-10-CM | POA: Diagnosis not present

## 2017-12-05 DIAGNOSIS — I2510 Atherosclerotic heart disease of native coronary artery without angina pectoris: Secondary | ICD-10-CM | POA: Diagnosis not present

## 2017-12-05 DIAGNOSIS — R2689 Other abnormalities of gait and mobility: Secondary | ICD-10-CM | POA: Diagnosis not present

## 2017-12-05 DIAGNOSIS — G309 Alzheimer's disease, unspecified: Secondary | ICD-10-CM | POA: Diagnosis not present

## 2017-12-07 DIAGNOSIS — I4891 Unspecified atrial fibrillation: Secondary | ICD-10-CM | POA: Diagnosis not present

## 2017-12-07 DIAGNOSIS — M6281 Muscle weakness (generalized): Secondary | ICD-10-CM | POA: Diagnosis not present

## 2017-12-07 DIAGNOSIS — I2510 Atherosclerotic heart disease of native coronary artery without angina pectoris: Secondary | ICD-10-CM | POA: Diagnosis not present

## 2017-12-07 DIAGNOSIS — G309 Alzheimer's disease, unspecified: Secondary | ICD-10-CM | POA: Diagnosis not present

## 2017-12-07 DIAGNOSIS — R2689 Other abnormalities of gait and mobility: Secondary | ICD-10-CM | POA: Diagnosis not present

## 2017-12-10 ENCOUNTER — Emergency Department (HOSPITAL_COMMUNITY)
Admission: EM | Admit: 2017-12-10 | Discharge: 2017-12-10 | Disposition: A | Discharge status: Home / Self Care | Payer: Medicare HMO | Attending: Emergency Medicine | Admitting: Emergency Medicine

## 2017-12-10 ENCOUNTER — Emergency Department (HOSPITAL_COMMUNITY): Payer: Medicare HMO

## 2017-12-10 ENCOUNTER — Other Ambulatory Visit: Payer: Self-pay

## 2017-12-10 ENCOUNTER — Encounter (HOSPITAL_COMMUNITY): Payer: Self-pay

## 2017-12-10 DIAGNOSIS — S4991XA Unspecified injury of right shoulder and upper arm, initial encounter: Secondary | ICD-10-CM | POA: Diagnosis not present

## 2017-12-10 DIAGNOSIS — Z87820 Personal history of traumatic brain injury: Secondary | ICD-10-CM | POA: Diagnosis not present

## 2017-12-10 DIAGNOSIS — W19XXXA Unspecified fall, initial encounter: Secondary | ICD-10-CM

## 2017-12-10 DIAGNOSIS — Z87891 Personal history of nicotine dependence: Secondary | ICD-10-CM | POA: Insufficient documentation

## 2017-12-10 DIAGNOSIS — I2510 Atherosclerotic heart disease of native coronary artery without angina pectoris: Secondary | ICD-10-CM | POA: Diagnosis not present

## 2017-12-10 DIAGNOSIS — R000 Tachycardia, unspecified: Secondary | ICD-10-CM | POA: Diagnosis present

## 2017-12-10 DIAGNOSIS — Z7982 Long term (current) use of aspirin: Secondary | ICD-10-CM | POA: Diagnosis not present

## 2017-12-10 DIAGNOSIS — I4891 Unspecified atrial fibrillation: Secondary | ICD-10-CM

## 2017-12-10 DIAGNOSIS — I10 Essential (primary) hypertension: Secondary | ICD-10-CM | POA: Diagnosis not present

## 2017-12-10 DIAGNOSIS — S098XXA Other specified injuries of head, initial encounter: Secondary | ICD-10-CM | POA: Diagnosis not present

## 2017-12-10 DIAGNOSIS — F0280 Dementia in other diseases classified elsewhere without behavioral disturbance: Secondary | ICD-10-CM | POA: Diagnosis not present

## 2017-12-10 DIAGNOSIS — G4489 Other headache syndrome: Secondary | ICD-10-CM | POA: Diagnosis not present

## 2017-12-10 DIAGNOSIS — Z79899 Other long term (current) drug therapy: Secondary | ICD-10-CM | POA: Diagnosis not present

## 2017-12-10 DIAGNOSIS — G309 Alzheimer's disease, unspecified: Secondary | ICD-10-CM | POA: Insufficient documentation

## 2017-12-10 DIAGNOSIS — M6281 Muscle weakness (generalized): Secondary | ICD-10-CM | POA: Diagnosis not present

## 2017-12-10 DIAGNOSIS — I4892 Unspecified atrial flutter: Secondary | ICD-10-CM

## 2017-12-10 DIAGNOSIS — R69 Illness, unspecified: Secondary | ICD-10-CM | POA: Diagnosis not present

## 2017-12-10 DIAGNOSIS — S3993XA Unspecified injury of pelvis, initial encounter: Secondary | ICD-10-CM | POA: Diagnosis not present

## 2017-12-10 DIAGNOSIS — R2689 Other abnormalities of gait and mobility: Secondary | ICD-10-CM | POA: Diagnosis not present

## 2017-12-10 LAB — BASIC METABOLIC PANEL
Anion gap: 9 (ref 5–15)
BUN: 12 mg/dL (ref 6–20)
CALCIUM: 8.7 mg/dL — AB (ref 8.9–10.3)
CO2: 22 mmol/L (ref 22–32)
CREATININE: 0.88 mg/dL (ref 0.61–1.24)
Chloride: 102 mmol/L (ref 101–111)
GFR calc Af Amer: >60 mL/min (ref >60)
GLUCOSE: 149 mg/dL — AB (ref 65–99)
Potassium: 3.5 mmol/L (ref 3.5–5.1)
SODIUM: 133 mmol/L — AB (ref 135–145)

## 2017-12-10 LAB — CBC WITH DIFFERENTIAL/PLATELET
Basophils Absolute: 0 10*3/uL (ref 0.0–0.1)
Basophils Relative: 0 %
EOS ABS: 0 10*3/uL (ref 0.0–0.7)
EOS PCT: 0 %
HCT: 44.5 % (ref 39.0–52.0)
Hemoglobin: 15.5 g/dL (ref 13.0–17.0)
LYMPHS ABS: 0.8 10*3/uL (ref 0.7–4.0)
LYMPHS PCT: 7 %
MCH: 31.5 pg (ref 26.0–34.0)
MCHC: 34.8 g/dL (ref 30.0–36.0)
MCV: 90.4 fL (ref 78.0–100.0)
MONO ABS: 0.3 10*3/uL (ref 0.1–1.0)
Monocytes Relative: 3 %
Neutro Abs: 10.4 10*3/uL — ABNORMAL HIGH (ref 1.7–7.7)
Neutrophils Relative %: 90 %
PLATELETS: 201 10*3/uL (ref 150–400)
RBC: 4.92 MIL/uL (ref 4.22–5.81)
RDW: 13.3 % (ref 11.5–15.5)
WBC: 11.5 10*3/uL — AB (ref 4.0–10.5)

## 2017-12-10 MED ORDER — MIDAZOLAM HCL 2 MG/2ML IJ SOLN
INTRAMUSCULAR | Status: AC
  Filled 2017-12-10: qty 2

## 2017-12-10 MED ORDER — DILTIAZEM HCL 25 MG/5ML IV SOLN
20.0000 mg | Freq: Once | INTRAVENOUS | Status: AC
  Administered 2017-12-10: 20 mg via INTRAVENOUS
  Filled 2017-12-10: qty 5

## 2017-12-10 MED ORDER — ADENOSINE 6 MG/2ML IV SOLN
12.0000 mg | Freq: Once | INTRAVENOUS | Status: AC
  Administered 2017-12-10: 12 mg via INTRAVENOUS

## 2017-12-10 MED ORDER — ADENOSINE 6 MG/2ML IV SOLN
INTRAVENOUS | Status: AC
  Filled 2017-12-10: qty 6

## 2017-12-10 MED ORDER — ADENOSINE 6 MG/2ML IV SOLN
6.0000 mg | Freq: Once | INTRAVENOUS | Status: AC
  Administered 2017-12-10: 6 mg via INTRAVENOUS

## 2017-12-10 NOTE — ED Notes (Signed)
2mg versed administered

## 2017-12-10 NOTE — ED Notes (Signed)
Adenosine given without improvement.

## 2017-12-10 NOTE — ED Notes (Signed)
Pt cardioverted at 100j synchronized shock.

## 2017-12-10 NOTE — ED Triage Notes (Signed)
[facility]- pt here from [place] with SVT. Pt had food poisoning this week. EMS tried 30mg  of adenosine, 20mg  of cardizem and a cardizem drip without improvement. Pt has dementia. Hypotensive with EMS.

## 2017-12-10 NOTE — ED Notes (Signed)
Patient transported to X-ray 

## 2017-12-10 NOTE — ED Provider Notes (Signed)
[facility] EMERGENCY DEPARTMENT Provider Note  CSN: [id number] Arrival date & time: 12/10/17 1194  Chief Complaint(s) Tachycardia  HPI Joseph Winters is a 82 y.o. male with a history of Alzheimer's disease/dementia, atrial fibrillation on metoprolol and not anticoagulated due to prior subdural hemorrhage who presents to the emergency department for an unwitnessed fall at a skilled nursing facility.  When EMS was called, patient was noted to be tachycardic, with an EKG concerning for SVT.  Patient was given 30 mg of adenosine without improvement followed by 20 mg of diltiazem bolus and drip without improvement.  Patient was noted to also be hypotensive with systolics in the 17E prior to diltiazem bolus.  He was not cardioverted.   Patient was apparently complaining of right shoulder pain and hip pain following the fall.  Remainder of history, ROS, and physical exam limited due to patient's condition (dementia). Additional information was obtained from EMS and family.   Level V Caveat.   The history is provided by a relative and the EMS personnel.    Past Medical History Past Medical History:  Diagnosis Date  . Alzheimer disease   . Atrial fibrillation ([place])   . Coronary artery disease   . Dysrhythmia 2009   24hr holter-PVC'S  . Hypertension 08/28/2008   Echo EF 45-50%  . Myocardial infarction ([place]) 1993  . Subdural hemorrhage [facility])    Patient Active Problem List   Diagnosis Date Noted  . Chronic atrial fibrillation ([place]) 11/15/2014  . Subdural hematoma ([place]) 04/04/2014  . PAF (paroxysmal atrial fibrillation), new from 01/2014 rate controlled.  03/06/2014  . Old MI (myocardial infarction) 03/01/2014  . CAD (coronary artery disease) 03/01/2014  . HTN (hypertension) 03/01/2014  . Hyperlipidemia with target LDL less than 70 03/01/2014  . Closed head injury 01/21/2014   Home Medication(s) Prior to Admission medications   Medication Sig Start Date End Date  Taking? Authorizing Provider  acetaminophen (TYLENOL) 500 MG tablet Take 1-2 tablets (500-1,000 mg total) by mouth every 6 (six) hours as needed for mild pain or moderate pain. 08/12/17  Yes Duffy Bruce, MD  aspirin 81 MG tablet Take 81 mg by mouth at bedtime.    Yes [provider]  atorvastatin (LIPITOR) 10 MG tablet Take 1 tablet (10 mg total) by mouth daily. Patient taking differently: Take 10 mg by mouth at bedtime.  09/27/17 12/26/17 Yes Troy Sine, MD  donepezil (ARICEPT) 10 MG tablet Take 10 mg by mouth at bedtime.   Yes [provider]  doxazosin (CARDURA) 1 MG tablet Take 1 tablet (1 mg total) by mouth daily. 09/19/17  Yes Troy Sine, MD  isosorbide mononitrate (IMDUR) 60 MG 24 hr tablet Take 1 tablet (60 mg total) by mouth daily. 09/19/17  Yes Troy Sine, MD  Metoprolol Succinate 50 MG CS24 Take 25 mg by mouth daily. 09/19/17  Yes Troy Sine, MD  Multiple Vitamins-Minerals (OCUVITE ADULT FORMULA PO) Take 1 capsule by mouth daily.   Yes [provider]  ramipril (ALTACE) 10 MG capsule Take 1 capsule (10 mg total) by mouth daily. 09/19/17  Yes Troy Sine, MD  amLODipine (NORVASC) 2.5 MG tablet Take 1 tablet (2.5 mg total) by mouth daily. Patient not taking: Reported on 12/10/2017 09/27/17 12/26/17  Troy Sine, MD  Past Surgical History Past Surgical History:  Procedure Laterality Date  . CORONARY ANGIOPLASTY     initial inferior MI 1993. 1994 Repeat PTCA   Family History History reviewed. No pertinent family history.  Social History Social History   Tobacco Use  . Smoking status: Former Smoker    Types: Cigarettes    Last attempt to quit: 12/11/1985    Years since quitting: 32.0  . Smokeless tobacco: Never Used  Substance Use Topics  . Alcohol use: Yes  . Drug use: No    Allergies Memantine  Review of Systems Review of Systems  Unable to perform ROS: Dementia    Physical Exam Vital Signs  I have /reviewed the triage vital signs BP (!) 85/70   Pulse (!) 212   Temp (!) 97.4 F (36.3 C) (Temporal)   Resp 19   SpO2 97%   Physical Exam  Constitutional: He appears well-developed and well-nourished. No distress.  HENT:  Head: Normocephalic and atraumatic. Head is without contusion.  Right Ear: External ear normal.  Left Ear: External ear normal.  Nose: Nose normal.  Mouth/Throat: Oropharynx is clear and moist.  Eyes: Conjunctivae and EOM are normal. Pupils are equal, round, and reactive to light. Right eye exhibits no discharge. Left eye exhibits no discharge. No scleral icterus.  Neck: Normal range of motion. Neck supple.  Cardiovascular: Regular rhythm and normal heart sounds. Tachycardia present. Exam reveals no gallop and no friction rub.  No murmur heard. Pulses:      Radial pulses are 2+ on the right side, and 2+ on the left side.       Dorsalis pedis pulses are 2+ on the right side, and 2+ on the left side.  Pulmonary/Chest: Effort normal and breath sounds normal. No stridor. No respiratory distress. He has no rales.  Abdominal: Soft. He exhibits no distension. There is no tenderness.  Musculoskeletal: He exhibits no edema or tenderness.       Cervical back: He exhibits no bony tenderness.       Thoracic back: He exhibits no bony tenderness.       Lumbar back: He exhibits no bony tenderness.  Clavicle stable. Chest stable to AP/Lat compression. Pelvis stable to Lat compression. No obvious extremity deformity. No chest or abdominal wall contusion.  Neurological: He is alert. He is disoriented. GCS eye subscore is 4. GCS verbal subscore is 5. GCS motor subscore is 6.  Moving all extremities   Skin: Skin is warm and dry. No rash noted. He is not diaphoretic. No erythema.  Psychiatric: He has a normal mood and affect.  Vitals  reviewed.   ED Results and Treatments Labs (all labs ordered are listed, but only abnormal results are displayed) Labs Reviewed  CBC WITH DIFFERENTIAL/PLATELET - Abnormal; Notable for the following components:      Result Value   WBC 11.5 (*)    Neutro Abs 10.4 (*)    All other components within normal limits  BASIC METABOLIC PANEL - Abnormal; Notable for the following components:   Sodium 133 (*)    Glucose, Bld 149 (*)    Calcium 8.7 (*)    All other components within normal limits  EKG  EKG Interpretation  Date/Time:  Monday December 10 2017 19:14:08 EST Ventricular Rate:  45 PR Interval:    QRS Duration: 156 QT Interval:  400 QTC Calculation: 346 R Axis:   105 Text Interpretation:  Atrial fibrillation Ventricular premature complex Nonspecific intraventricular conduction delay Anteroseptal infarct, old Repol abnrm, severe global ischemia (LM/MVD) resolved tachycardia Confirmed by Addison Lank [phone]) on 12/10/2017 7:41:50 PM      Radiology Dg Pelvis 1-2 Views  Result Date: 12/10/2017 CLINICAL DATA:  Fall. EXAM: PELVIS - 1-2 VIEW COMPARISON:  None. FINDINGS: There is no evidence of pelvic fracture or diastasis. No pelvic bone lesions are seen. Degenerative changes in the lower lumbar spine and in the hips. Vascular calcifications. IMPRESSION: No acute bony abnormalities. Electronically Signed   By: Lucienne Capers M.D.   On: 12/10/2017 20:35   Dg Shoulder Right  Result Date: 12/10/2017 CLINICAL DATA:  Fall. EXAM: RIGHT SHOULDER - 2+ VIEW COMPARISON:  None. FINDINGS: Mild degenerative changes in the glenohumeral and acromioclavicular joints of the right shoulder. No evidence of acute fracture or dislocation. No focal bone lesion or bone destruction. Coracoclavicular and acromioclavicular spaces are maintained. Soft tissues are unremarkable. IMPRESSION:  Mild degenerative changes in the right shoulder. No acute bony abnormalities. Electronically Signed   By: Lucienne Capers M.D.   On: 12/10/2017 20:36   Pertinent labs & imaging results that were available during my care of the patient were reviewed by me and considered in my medical decision making (see chart for details).  Medications Ordered in ED Medications  adenosine (ADENOCARD) 6 MG/2ML injection (not administered)  midazolam (VERSED) 2 MG/2ML injection (not administered)  adenosine (ADENOCARD) 6 MG/2ML injection 6 mg (6 mg Intravenous Given 12/10/17 1850)  adenosine (ADENOCARD) 6 MG/2ML injection 12 mg (12 mg Intravenous Given 12/10/17 1851)  diltiazem (CARDIZEM) injection 20 mg (20 mg Intravenous Given 12/10/17 1855)                                                                                                                                    Procedures .Cardioversion Date/Time: 12/10/2017 7:10 PM Performed by: Fatima Blank, MD Authorized by: Fatima Blank, MD   Consent:    Consent obtained:  Verbal   Consent given by:  Spouse   Risks discussed:  Cutaneous burn, induced arrhythmia and death   Alternatives discussed:  Alternative treatment Pre-procedure details:    Cardioversion basis:  Emergent   Pre-procedure rhythm: difficult to assess rhythm due to rate; possible SVT vs afib RVR vs atrial flutter RVR.   Electrode placement:  Anterior-posterior Patient sedated: Yes. Refer to sedation procedure documentation for details of sedation.  Attempt one:    Cardioversion mode:  Synchronous   Waveform:  Biphasic   Shock (Joules):  100   Shock outcome:  Conversion to other rhythm (atrial fibrilation (baseline rhythm)) Post-procedure details:    Patient status:  Awake   Patient  tolerance of procedure:  Tolerated well, no immediate complications   CRITICAL CARE Performed by: Grayce Sessions Geraldyne Barraclough Total critical care time: 55 minutes Critical care time was  exclusive of separately billable procedures and treating other patients. Critical care was necessary to treat or prevent imminent or life-threatening deterioration. Critical care was time spent personally by me on the following activities: development of treatment plan with patient and/or surrogate as well as nursing, discussions with consultants, evaluation of patient's response to treatment, examination of patient, obtaining history from patient or surrogate, ordering and performing treatments and interventions, ordering and review of laboratory studies, ordering and review of radiographic studies, pulse oximetry and re-evaluation of patient's condition.  (including critical care time)  Medical Decision Making / ED Course I have reviewed the nursing notes for this encounter and the patient's prior records (if available in EHR or on provided paperwork).    Patient with a heart rate of 200s.  Rhythm unable to be assessed at this time due to rate, SVT versus A. fib RVR versus atrial flutter with RVR.  Does not appear to be ventricular tachycardia.  I spoke with the wife and the patient is a DNR/DNI however she would like to attempt chemical or electrical cardioversion as needed.  Unable to flush EMSs IV thus a second IV was established.  Unsure if patient received the medications that was provided by EMS so they were repeated in the emergency department.  Patient was given a total of 18 mg of adenosine without change in rhythm or rate.  Also given a 20 mg bolus of diltiazem without any change.  Patient has been having intermittent soft blood pressures and required electrical cardioversion which resulted in rate improvement from 200s to baseline of 40s/50s noted to be in atrial fibrillation similar to his prior rhythms.  Labs were otherwise grossly reassuring without significant electrolyte derangements.  Plain films of the right shoulder and pelvis were obtained without any acute injuries.  The family  and I discussed imaging of the head to assess for any ICH however, given the fact that the patient is at his baseline mental status, not anticoagulated, and would not seek surgical intervention they declined CT imaging at this time.  Patient was monitored for several hours following electrical cardioversion without recurrence.  He remained hemodynamically stable at his baseline mental status and felt to be safe for discharge back to his skilled nursing facility.   Final Clinical Impression(s) / ED Diagnoses Final diagnoses:  Fall  Atrial fibrillation with RVR ([place])    Disposition: Discharge  Condition: Good  I have discussed the results, Dx and Tx plan with the patient's family who expressed understanding and agree(s) with the plan. Discharge instructions discussed at great length. The patient's family was given strict return precautions who verbalized understanding of the instructions. No further questions at time of discharge.    ED Discharge Orders    None       Follow Up: Lavone Orn, MD [address] [place] [address] [phone]  Schedule an appointment as soon as possible for a visit  As needed  Troy Sine, MD [address] [place] [address] [phone]  Schedule an appointment as soon as possible for a visit  As needed     This chart was dictated using voice recognition software.  Despite best efforts to proofread,  errors can occur which can change the documentation meaning.   Fatima Blank, MD 12/11/17 [phone]

## 2017-12-10 NOTE — Progress Notes (Signed)
Called by ER 2/2 rapid regular tachycardia occurring in pt with known afib.  Adenosine and ddilt by EMS and ER failed to do anything and so was cardioverted.  The resulting rhtyhm was afib, which led me to believe that the intervening tachycardia should have been VT as the atrial rhtyhm appeared to have been afib  Before and after,  However on looking at the tracings the rapid regular tachycardia has a very similar morphology to the afib suggesting either a high perhisian ventricular tachycardia or atrial flutter, but in either case the Q from the ER was about anticoagulation given the fact that the patient was cardoverted.  He is not an anticoagulation candidate by previous decision.  My recommendation was that, as the atrial rhythm was still fibrillation, that there should be no incremental risk of clots, as they presumably emerge in a stunned and soon to be contracting atrium

## 2017-12-12 DIAGNOSIS — M6281 Muscle weakness (generalized): Secondary | ICD-10-CM | POA: Diagnosis not present

## 2017-12-12 DIAGNOSIS — R2689 Other abnormalities of gait and mobility: Secondary | ICD-10-CM | POA: Diagnosis not present

## 2017-12-12 DIAGNOSIS — I2510 Atherosclerotic heart disease of native coronary artery without angina pectoris: Secondary | ICD-10-CM | POA: Diagnosis not present

## 2017-12-12 DIAGNOSIS — G309 Alzheimer's disease, unspecified: Secondary | ICD-10-CM | POA: Diagnosis not present

## 2017-12-12 DIAGNOSIS — I4891 Unspecified atrial fibrillation: Secondary | ICD-10-CM | POA: Diagnosis not present

## 2017-12-13 DIAGNOSIS — I4891 Unspecified atrial fibrillation: Secondary | ICD-10-CM | POA: Diagnosis not present

## 2017-12-13 DIAGNOSIS — G309 Alzheimer's disease, unspecified: Secondary | ICD-10-CM | POA: Diagnosis not present

## 2017-12-13 DIAGNOSIS — R2689 Other abnormalities of gait and mobility: Secondary | ICD-10-CM | POA: Diagnosis not present

## 2017-12-13 DIAGNOSIS — M6281 Muscle weakness (generalized): Secondary | ICD-10-CM | POA: Diagnosis not present

## 2017-12-13 DIAGNOSIS — I2510 Atherosclerotic heart disease of native coronary artery without angina pectoris: Secondary | ICD-10-CM | POA: Diagnosis not present

## 2017-12-14 DIAGNOSIS — I4891 Unspecified atrial fibrillation: Secondary | ICD-10-CM | POA: Diagnosis not present

## 2017-12-14 DIAGNOSIS — I2510 Atherosclerotic heart disease of native coronary artery without angina pectoris: Secondary | ICD-10-CM | POA: Diagnosis not present

## 2017-12-14 DIAGNOSIS — G309 Alzheimer's disease, unspecified: Secondary | ICD-10-CM | POA: Diagnosis not present

## 2017-12-14 DIAGNOSIS — R2689 Other abnormalities of gait and mobility: Secondary | ICD-10-CM | POA: Diagnosis not present

## 2017-12-14 DIAGNOSIS — M6281 Muscle weakness (generalized): Secondary | ICD-10-CM | POA: Diagnosis not present

## 2017-12-17 DIAGNOSIS — G309 Alzheimer's disease, unspecified: Secondary | ICD-10-CM | POA: Diagnosis not present

## 2017-12-17 DIAGNOSIS — R451 Restlessness and agitation: Secondary | ICD-10-CM | POA: Diagnosis not present

## 2017-12-17 DIAGNOSIS — M6281 Muscle weakness (generalized): Secondary | ICD-10-CM | POA: Diagnosis not present

## 2017-12-17 DIAGNOSIS — R69 Illness, unspecified: Secondary | ICD-10-CM | POA: Diagnosis not present

## 2017-12-17 DIAGNOSIS — I4891 Unspecified atrial fibrillation: Secondary | ICD-10-CM | POA: Diagnosis not present

## 2017-12-17 DIAGNOSIS — I2510 Atherosclerotic heart disease of native coronary artery without angina pectoris: Secondary | ICD-10-CM | POA: Diagnosis not present

## 2017-12-17 DIAGNOSIS — R2689 Other abnormalities of gait and mobility: Secondary | ICD-10-CM | POA: Diagnosis not present

## 2017-12-18 DIAGNOSIS — G309 Alzheimer's disease, unspecified: Secondary | ICD-10-CM | POA: Diagnosis not present

## 2017-12-18 DIAGNOSIS — I4891 Unspecified atrial fibrillation: Secondary | ICD-10-CM | POA: Diagnosis not present

## 2017-12-18 DIAGNOSIS — R2689 Other abnormalities of gait and mobility: Secondary | ICD-10-CM | POA: Diagnosis not present

## 2017-12-18 DIAGNOSIS — M6281 Muscle weakness (generalized): Secondary | ICD-10-CM | POA: Diagnosis not present

## 2017-12-18 DIAGNOSIS — I2510 Atherosclerotic heart disease of native coronary artery without angina pectoris: Secondary | ICD-10-CM | POA: Diagnosis not present

## 2017-12-19 ENCOUNTER — Encounter: Payer: Self-pay | Admitting: Adult Health

## 2017-12-19 ENCOUNTER — Ambulatory Visit: Payer: Medicare HMO | Admitting: Adult Health

## 2017-12-19 VITALS — BP 128/68 | HR 68 | Ht 66.0 in | Wt 158.6 lb

## 2017-12-19 DIAGNOSIS — I482 Chronic atrial fibrillation: Secondary | ICD-10-CM

## 2017-12-19 DIAGNOSIS — I4821 Permanent atrial fibrillation: Secondary | ICD-10-CM

## 2017-12-19 DIAGNOSIS — I2510 Atherosclerotic heart disease of native coronary artery without angina pectoris: Secondary | ICD-10-CM

## 2017-12-19 DIAGNOSIS — I2583 Coronary atherosclerosis due to lipid rich plaque: Secondary | ICD-10-CM

## 2017-12-19 DIAGNOSIS — I10 Essential (primary) hypertension: Secondary | ICD-10-CM | POA: Diagnosis not present

## 2017-12-19 NOTE — Patient Instructions (Signed)
Medication Instructions:  NO CHANGES-Your physician recommends that you continue on your current medications as directed. Please refer to the Current Medication list given to you today.  If you need a refill on your cardiac medications before your next appointment, please call your pharmacy.  Follow-Up: Your physician wants you to follow-up in: [place] should receive a reminder letter in the mail two months in advance. If you do not receive a letter, please call our office MAY 2019 to schedule the July 2019 follow-up appointment.  Thank you for choosing [facility]!!

## 2017-12-19 NOTE — Progress Notes (Signed)
Cardiology Office Note   Date:  12/19/2017   ID:  Joseph Winters, DOB 1935/04/12, MRN [id number]  PCP:  Lavone Orn, MD  Cardiologist:  Shelva Majestic. MD Electrophysiologist: Virl Axe, MD  Chief Complaint  Patient presents with  . Follow-up    ed visit, AFIB     History of Present Illness: Joseph Winters is a 83 y.o. male who presents for ongoing assessment and management of atrial fibrillation. He was seen in the ER by Dr. Caryl Comes on 12/10/2017 during episode of rapid atrial fibrillation. He was given adenosine and diltiazem by EMS, with no response and was therefore cardioverted.   Apparently they had gone out to lunch the day before he was seen in the ER, with family. They had oysters. Family members and the patent became ill with GI symptoms that evening, including the patent, with NV and diarrhea. The patient became weak and dehydrated, fell at the SNF. Family noticed that he was clammy and diaphoretic. This prompted above ER visit and treatment.   Dr. Olin Pia note documented the tracings the rapid regular tachycardia has a very similar morphology to the afib suggesting either a high perhisian ventricular tachycardia or atrial flutter.   He was not found to be a candidate for anticoagulation due to history of subarachnoid hemorrhage.   Other history included prior inferior wall MI, 1993, PCI to his RCA, and stage PCI of LAD. Repeat PTCA in 1994, HTN, and Alzheimer dementia.  March 2015 he had noted some forgetfulness and some confusion intermittently. He underwent another CT scan which suggested acute to subacute on chronic right subdural hematoma with mild mass effect which was new. He did not have significant midline shift. There is resolution of prior parenchymal contusions and subarachnoid hemorrhage on the left. He continues to have frequent falls.   He is here today with his wife and his feeling much better. He is eating and sleeping, no complaints of rapid HR. His wife visits  him daily and does not report any further issues.   Past Medical History:  Diagnosis Date  . Alzheimer disease   . Atrial fibrillation ([place])   . Coronary artery disease   . Dysrhythmia 2009   24hr holter-PVC'S  . Hypertension 08/28/2008   Echo EF 45-50%  . Myocardial infarction ([place]) 1993  . Subdural hemorrhage [facility])     Past Surgical History:  Procedure Laterality Date  . CORONARY ANGIOPLASTY     initial inferior MI 1993. 1994 Repeat PTCA     Current Outpatient Medications  Medication Sig Dispense Refill  . acetaminophen (TYLENOL) 500 MG tablet Take 1-2 tablets (500-1,000 mg total) by mouth every 6 (six) hours as needed for mild pain or moderate pain. 30 tablet 0  . aspirin 81 MG tablet Take 81 mg by mouth at bedtime.     Marland Kitchen atorvastatin (LIPITOR) 10 MG tablet Take 1 tablet (10 mg total) by mouth daily. (Patient taking differently: Take 10 mg by mouth at bedtime. ) 90 tablet 1  . donepezil (ARICEPT) 10 MG tablet Take 10 mg by mouth at bedtime.    Marland Kitchen doxazosin (CARDURA) 1 MG tablet Take 1 tablet (1 mg total) by mouth daily. 90 tablet 2  . isosorbide mononitrate (IMDUR) 60 MG 24 hr tablet Take 1 tablet (60 mg total) by mouth daily. 90 tablet 2  . Metoprolol Succinate 50 MG CS24 Take 25 mg by mouth daily. 45 capsule 3  . Multiple Vitamins-Minerals (OCUVITE ADULT FORMULA PO) Take 1 capsule by  mouth daily.    . ramipril (ALTACE) 10 MG capsule Take 1 capsule (10 mg total) by mouth daily. 90 capsule 2   No current facility-administered medications for this visit.     Allergies:   Memantine    Social History:  The patient  reports that he quit smoking about 32 years ago. His smoking use included cigarettes. he has never used smokeless tobacco. He reports that he drinks alcohol. He reports that he does not use drugs.   Family History:  The patient's family history is not on file.    ROS: All other systems are reviewed and negative. Unless otherwise mentioned in H&P    PHYSICAL  EXAM: VS:  BP 128/68   Pulse 68   Ht 5\' 6"  (1.676 m)   Wt 158 lb 9.6 oz (71.9 kg)   BMI 25.60 kg/m  , BMI Body mass index is 25.6 kg/m. GEN: Well nourished, well developed, in no acute distress  HEENT: normal  Neck: no JVD, carotid bruits, or masses Cardiac: IRRR; bradycardic, no murmurs, rubs, or gallops, mild ankle edema  Respiratory:  clear to auscultation bilaterally, normal work of breathing GI: soft, nontender, nondistended, + BS MS: no deformity or atrophy  Skin: warm and dry, no rash Neuro:  Strength and sensation are intact Psych: euthymic mood, some mild confusion.    EKG:  Atrial fibrillation, rate of 60 bpm. Old anterior infarct with intraventricular conduction delay.  Recent Labs: 12/10/2017: BUN 12; Creatinine, Ser 0.88; Hemoglobin 15.5; Platelets 201; Potassium 3.5; Sodium 133    Lipid Panel    Component Value Date/Time   CHOL 151 05/04/2016 1020   TRIG 52 05/04/2016 1020   HDL 84 05/04/2016 1020   CHOLHDL 1.8 05/04/2016 1020   VLDL 10 05/04/2016 1020   LDLCALC 57 05/04/2016 1020      Wt Readings from Last 3 Encounters:  12/19/17 158 lb 9.6 oz (71.9 kg)  09/12/17 159 lb 3.2 oz (72.2 kg)  02/27/17 176 lb (79.8 kg)      Other studies Reviewed:   Echocardiogram 03/03/2014 Left ventricle: The cavity size was normal. Wall thickness was increased in a pattern of moderate LVH. Systolic function was moderately reduced. The estimated ejection fraction was 35%, in the range of 35% to 40%. Diffuse hypokinesis. There is akinesis of the inferior posterior myocardium. - Mitral valve: Calcified annulus. - Left atrium: The atrium was moderately dilated. - Right atrium: The atrium was mildly dilated.   ASSESSMENT AND PLAN:  1.  Atrial fib: Had episode of afib RVR not responsive to adenosine or diltiazem. He required DCCV. He is not a candidate for anticoagulation in the setting of subarachnoid hemorrhage. He is now rate controlled. Likely related to  physiologic stress int he setting of GI illness, possible food poisoning after eating oysters. He is now feeling much better. He is eating and drinking, taking his medications as directed.   2. CAD: No interventions since 1994 after PTCA of the LAD, with PCI of RCA in 1993. He continues on BB, ASA, and statin therapy.   3. Hypertension: BP is well controlled currently. No changes in his regimen.    Current medicines are reviewed at length with the patient today.    Labs/ tests ordered today include: None  Phill Myron. West Pugh, ANP, AACC   12/19/2017 4:28 PM    [facility] [address] [place], [place] [address] Phone: [phone]; Fax: [phone]

## 2017-12-20 DIAGNOSIS — G309 Alzheimer's disease, unspecified: Secondary | ICD-10-CM | POA: Diagnosis not present

## 2017-12-20 DIAGNOSIS — M6281 Muscle weakness (generalized): Secondary | ICD-10-CM | POA: Diagnosis not present

## 2017-12-20 DIAGNOSIS — I2510 Atherosclerotic heart disease of native coronary artery without angina pectoris: Secondary | ICD-10-CM | POA: Diagnosis not present

## 2017-12-20 DIAGNOSIS — R2689 Other abnormalities of gait and mobility: Secondary | ICD-10-CM | POA: Diagnosis not present

## 2017-12-20 DIAGNOSIS — I4891 Unspecified atrial fibrillation: Secondary | ICD-10-CM | POA: Diagnosis not present

## 2017-12-21 NOTE — Addendum Note (Signed)
Addended by: Leanord Asal T on: 12/21/2017 02:37 PM   Modules accepted: Orders

## 2017-12-24 DIAGNOSIS — R2689 Other abnormalities of gait and mobility: Secondary | ICD-10-CM | POA: Diagnosis not present

## 2017-12-24 DIAGNOSIS — G309 Alzheimer's disease, unspecified: Secondary | ICD-10-CM | POA: Diagnosis not present

## 2017-12-24 DIAGNOSIS — I2510 Atherosclerotic heart disease of native coronary artery without angina pectoris: Secondary | ICD-10-CM | POA: Diagnosis not present

## 2017-12-24 DIAGNOSIS — I4891 Unspecified atrial fibrillation: Secondary | ICD-10-CM | POA: Diagnosis not present

## 2017-12-24 DIAGNOSIS — M6281 Muscle weakness (generalized): Secondary | ICD-10-CM | POA: Diagnosis not present

## 2017-12-26 DIAGNOSIS — I2510 Atherosclerotic heart disease of native coronary artery without angina pectoris: Secondary | ICD-10-CM | POA: Diagnosis not present

## 2017-12-26 DIAGNOSIS — I4891 Unspecified atrial fibrillation: Secondary | ICD-10-CM | POA: Diagnosis not present

## 2017-12-26 DIAGNOSIS — R2689 Other abnormalities of gait and mobility: Secondary | ICD-10-CM | POA: Diagnosis not present

## 2017-12-26 DIAGNOSIS — G309 Alzheimer's disease, unspecified: Secondary | ICD-10-CM | POA: Diagnosis not present

## 2017-12-26 DIAGNOSIS — M6281 Muscle weakness (generalized): Secondary | ICD-10-CM | POA: Diagnosis not present

## 2017-12-31 DIAGNOSIS — I2510 Atherosclerotic heart disease of native coronary artery without angina pectoris: Secondary | ICD-10-CM | POA: Diagnosis not present

## 2017-12-31 DIAGNOSIS — M6281 Muscle weakness (generalized): Secondary | ICD-10-CM | POA: Diagnosis not present

## 2017-12-31 DIAGNOSIS — I4891 Unspecified atrial fibrillation: Secondary | ICD-10-CM | POA: Diagnosis not present

## 2017-12-31 DIAGNOSIS — G309 Alzheimer's disease, unspecified: Secondary | ICD-10-CM | POA: Diagnosis not present

## 2017-12-31 DIAGNOSIS — R2689 Other abnormalities of gait and mobility: Secondary | ICD-10-CM | POA: Diagnosis not present

## 2018-01-01 DIAGNOSIS — I10 Essential (primary) hypertension: Secondary | ICD-10-CM | POA: Diagnosis not present

## 2018-01-01 DIAGNOSIS — R69 Illness, unspecified: Secondary | ICD-10-CM | POA: Diagnosis not present

## 2018-01-01 DIAGNOSIS — I4891 Unspecified atrial fibrillation: Secondary | ICD-10-CM | POA: Diagnosis not present

## 2018-01-01 DIAGNOSIS — N400 Enlarged prostate without lower urinary tract symptoms: Secondary | ICD-10-CM | POA: Diagnosis not present

## 2018-01-01 DIAGNOSIS — I25119 Atherosclerotic heart disease of native coronary artery with unspecified angina pectoris: Secondary | ICD-10-CM | POA: Diagnosis not present

## 2018-01-04 DIAGNOSIS — G309 Alzheimer's disease, unspecified: Secondary | ICD-10-CM | POA: Diagnosis not present

## 2018-01-04 DIAGNOSIS — I2510 Atherosclerotic heart disease of native coronary artery without angina pectoris: Secondary | ICD-10-CM | POA: Diagnosis not present

## 2018-01-04 DIAGNOSIS — I4891 Unspecified atrial fibrillation: Secondary | ICD-10-CM | POA: Diagnosis not present

## 2018-01-04 DIAGNOSIS — M6281 Muscle weakness (generalized): Secondary | ICD-10-CM | POA: Diagnosis not present

## 2018-01-04 DIAGNOSIS — R2689 Other abnormalities of gait and mobility: Secondary | ICD-10-CM | POA: Diagnosis not present

## 2018-01-14 DIAGNOSIS — G309 Alzheimer's disease, unspecified: Secondary | ICD-10-CM | POA: Diagnosis not present

## 2018-01-14 DIAGNOSIS — R69 Illness, unspecified: Secondary | ICD-10-CM | POA: Diagnosis not present

## 2018-01-14 DIAGNOSIS — R451 Restlessness and agitation: Secondary | ICD-10-CM | POA: Diagnosis not present

## 2018-01-14 DIAGNOSIS — I4891 Unspecified atrial fibrillation: Secondary | ICD-10-CM | POA: Diagnosis not present

## 2018-01-14 DIAGNOSIS — I2510 Atherosclerotic heart disease of native coronary artery without angina pectoris: Secondary | ICD-10-CM | POA: Diagnosis not present

## 2018-01-14 DIAGNOSIS — M6281 Muscle weakness (generalized): Secondary | ICD-10-CM | POA: Diagnosis not present

## 2018-01-14 DIAGNOSIS — R2689 Other abnormalities of gait and mobility: Secondary | ICD-10-CM | POA: Diagnosis not present

## 2018-01-18 DIAGNOSIS — R2689 Other abnormalities of gait and mobility: Secondary | ICD-10-CM | POA: Diagnosis not present

## 2018-01-18 DIAGNOSIS — I2510 Atherosclerotic heart disease of native coronary artery without angina pectoris: Secondary | ICD-10-CM | POA: Diagnosis not present

## 2018-01-18 DIAGNOSIS — M6281 Muscle weakness (generalized): Secondary | ICD-10-CM | POA: Diagnosis not present

## 2018-01-18 DIAGNOSIS — G309 Alzheimer's disease, unspecified: Secondary | ICD-10-CM | POA: Diagnosis not present

## 2018-01-18 DIAGNOSIS — I4891 Unspecified atrial fibrillation: Secondary | ICD-10-CM | POA: Diagnosis not present

## 2018-01-22 DIAGNOSIS — I4891 Unspecified atrial fibrillation: Secondary | ICD-10-CM | POA: Diagnosis not present

## 2018-01-22 DIAGNOSIS — G309 Alzheimer's disease, unspecified: Secondary | ICD-10-CM | POA: Diagnosis not present

## 2018-01-22 DIAGNOSIS — M6281 Muscle weakness (generalized): Secondary | ICD-10-CM | POA: Diagnosis not present

## 2018-01-22 DIAGNOSIS — R2689 Other abnormalities of gait and mobility: Secondary | ICD-10-CM | POA: Diagnosis not present

## 2018-01-22 DIAGNOSIS — I2510 Atherosclerotic heart disease of native coronary artery without angina pectoris: Secondary | ICD-10-CM | POA: Diagnosis not present

## 2018-01-25 DIAGNOSIS — G309 Alzheimer's disease, unspecified: Secondary | ICD-10-CM | POA: Diagnosis not present

## 2018-01-25 DIAGNOSIS — M6281 Muscle weakness (generalized): Secondary | ICD-10-CM | POA: Diagnosis not present

## 2018-01-25 DIAGNOSIS — I2510 Atherosclerotic heart disease of native coronary artery without angina pectoris: Secondary | ICD-10-CM | POA: Diagnosis not present

## 2018-01-25 DIAGNOSIS — R2689 Other abnormalities of gait and mobility: Secondary | ICD-10-CM | POA: Diagnosis not present

## 2018-01-25 DIAGNOSIS — I4891 Unspecified atrial fibrillation: Secondary | ICD-10-CM | POA: Diagnosis not present

## 2018-01-30 DIAGNOSIS — I4891 Unspecified atrial fibrillation: Secondary | ICD-10-CM | POA: Diagnosis not present

## 2018-01-30 DIAGNOSIS — I25119 Atherosclerotic heart disease of native coronary artery with unspecified angina pectoris: Secondary | ICD-10-CM | POA: Diagnosis not present

## 2018-01-30 DIAGNOSIS — N400 Enlarged prostate without lower urinary tract symptoms: Secondary | ICD-10-CM | POA: Diagnosis not present

## 2018-01-30 DIAGNOSIS — R69 Illness, unspecified: Secondary | ICD-10-CM | POA: Diagnosis not present

## 2018-01-30 DIAGNOSIS — I10 Essential (primary) hypertension: Secondary | ICD-10-CM | POA: Diagnosis not present

## 2018-01-31 DIAGNOSIS — E785 Hyperlipidemia, unspecified: Secondary | ICD-10-CM | POA: Diagnosis not present

## 2018-01-31 DIAGNOSIS — G309 Alzheimer's disease, unspecified: Secondary | ICD-10-CM | POA: Diagnosis not present

## 2018-01-31 DIAGNOSIS — I2510 Atherosclerotic heart disease of native coronary artery without angina pectoris: Secondary | ICD-10-CM | POA: Diagnosis not present

## 2018-01-31 DIAGNOSIS — I4891 Unspecified atrial fibrillation: Secondary | ICD-10-CM | POA: Diagnosis not present

## 2018-01-31 DIAGNOSIS — R41841 Cognitive communication deficit: Secondary | ICD-10-CM | POA: Diagnosis not present

## 2018-02-01 DIAGNOSIS — I4891 Unspecified atrial fibrillation: Secondary | ICD-10-CM | POA: Diagnosis not present

## 2018-02-01 DIAGNOSIS — I2510 Atherosclerotic heart disease of native coronary artery without angina pectoris: Secondary | ICD-10-CM | POA: Diagnosis not present

## 2018-02-01 DIAGNOSIS — G309 Alzheimer's disease, unspecified: Secondary | ICD-10-CM | POA: Diagnosis not present

## 2018-02-01 DIAGNOSIS — E785 Hyperlipidemia, unspecified: Secondary | ICD-10-CM | POA: Diagnosis not present

## 2018-02-01 DIAGNOSIS — R41841 Cognitive communication deficit: Secondary | ICD-10-CM | POA: Diagnosis not present

## 2018-02-05 DIAGNOSIS — G309 Alzheimer's disease, unspecified: Secondary | ICD-10-CM | POA: Diagnosis not present

## 2018-02-05 DIAGNOSIS — E785 Hyperlipidemia, unspecified: Secondary | ICD-10-CM | POA: Diagnosis not present

## 2018-02-05 DIAGNOSIS — I4891 Unspecified atrial fibrillation: Secondary | ICD-10-CM | POA: Diagnosis not present

## 2018-02-05 DIAGNOSIS — R41841 Cognitive communication deficit: Secondary | ICD-10-CM | POA: Diagnosis not present

## 2018-02-05 DIAGNOSIS — I2510 Atherosclerotic heart disease of native coronary artery without angina pectoris: Secondary | ICD-10-CM | POA: Diagnosis not present

## 2018-02-08 DIAGNOSIS — E785 Hyperlipidemia, unspecified: Secondary | ICD-10-CM | POA: Diagnosis not present

## 2018-02-08 DIAGNOSIS — G309 Alzheimer's disease, unspecified: Secondary | ICD-10-CM | POA: Diagnosis not present

## 2018-02-08 DIAGNOSIS — R41841 Cognitive communication deficit: Secondary | ICD-10-CM | POA: Diagnosis not present

## 2018-02-08 DIAGNOSIS — I2510 Atherosclerotic heart disease of native coronary artery without angina pectoris: Secondary | ICD-10-CM | POA: Diagnosis not present

## 2018-02-08 DIAGNOSIS — I4891 Unspecified atrial fibrillation: Secondary | ICD-10-CM | POA: Diagnosis not present

## 2018-02-11 DIAGNOSIS — E785 Hyperlipidemia, unspecified: Secondary | ICD-10-CM | POA: Diagnosis not present

## 2018-02-11 DIAGNOSIS — R69 Illness, unspecified: Secondary | ICD-10-CM | POA: Diagnosis not present

## 2018-02-11 DIAGNOSIS — G309 Alzheimer's disease, unspecified: Secondary | ICD-10-CM | POA: Diagnosis not present

## 2018-02-11 DIAGNOSIS — I4891 Unspecified atrial fibrillation: Secondary | ICD-10-CM | POA: Diagnosis not present

## 2018-02-11 DIAGNOSIS — I2510 Atherosclerotic heart disease of native coronary artery without angina pectoris: Secondary | ICD-10-CM | POA: Diagnosis not present

## 2018-02-11 DIAGNOSIS — R451 Restlessness and agitation: Secondary | ICD-10-CM | POA: Diagnosis not present

## 2018-02-11 DIAGNOSIS — R41841 Cognitive communication deficit: Secondary | ICD-10-CM | POA: Diagnosis not present

## 2018-02-13 DIAGNOSIS — I2510 Atherosclerotic heart disease of native coronary artery without angina pectoris: Secondary | ICD-10-CM | POA: Diagnosis not present

## 2018-02-13 DIAGNOSIS — E785 Hyperlipidemia, unspecified: Secondary | ICD-10-CM | POA: Diagnosis not present

## 2018-02-13 DIAGNOSIS — I4891 Unspecified atrial fibrillation: Secondary | ICD-10-CM | POA: Diagnosis not present

## 2018-02-13 DIAGNOSIS — R41841 Cognitive communication deficit: Secondary | ICD-10-CM | POA: Diagnosis not present

## 2018-02-13 DIAGNOSIS — G309 Alzheimer's disease, unspecified: Secondary | ICD-10-CM | POA: Diagnosis not present

## 2018-02-14 DIAGNOSIS — I4891 Unspecified atrial fibrillation: Secondary | ICD-10-CM | POA: Diagnosis not present

## 2018-02-14 DIAGNOSIS — E785 Hyperlipidemia, unspecified: Secondary | ICD-10-CM | POA: Diagnosis not present

## 2018-02-14 DIAGNOSIS — G309 Alzheimer's disease, unspecified: Secondary | ICD-10-CM | POA: Diagnosis not present

## 2018-02-14 DIAGNOSIS — R41841 Cognitive communication deficit: Secondary | ICD-10-CM | POA: Diagnosis not present

## 2018-02-14 DIAGNOSIS — I2510 Atherosclerotic heart disease of native coronary artery without angina pectoris: Secondary | ICD-10-CM | POA: Diagnosis not present

## 2018-02-19 DIAGNOSIS — R41841 Cognitive communication deficit: Secondary | ICD-10-CM | POA: Diagnosis not present

## 2018-02-19 DIAGNOSIS — I2510 Atherosclerotic heart disease of native coronary artery without angina pectoris: Secondary | ICD-10-CM | POA: Diagnosis not present

## 2018-02-19 DIAGNOSIS — I4891 Unspecified atrial fibrillation: Secondary | ICD-10-CM | POA: Diagnosis not present

## 2018-02-19 DIAGNOSIS — G309 Alzheimer's disease, unspecified: Secondary | ICD-10-CM | POA: Diagnosis not present

## 2018-02-19 DIAGNOSIS — E785 Hyperlipidemia, unspecified: Secondary | ICD-10-CM | POA: Diagnosis not present

## 2018-02-21 DIAGNOSIS — G309 Alzheimer's disease, unspecified: Secondary | ICD-10-CM | POA: Diagnosis not present

## 2018-02-21 DIAGNOSIS — I2510 Atherosclerotic heart disease of native coronary artery without angina pectoris: Secondary | ICD-10-CM | POA: Diagnosis not present

## 2018-02-21 DIAGNOSIS — I4891 Unspecified atrial fibrillation: Secondary | ICD-10-CM | POA: Diagnosis not present

## 2018-02-21 DIAGNOSIS — E785 Hyperlipidemia, unspecified: Secondary | ICD-10-CM | POA: Diagnosis not present

## 2018-02-21 DIAGNOSIS — R41841 Cognitive communication deficit: Secondary | ICD-10-CM | POA: Diagnosis not present

## 2018-02-27 DIAGNOSIS — G309 Alzheimer's disease, unspecified: Secondary | ICD-10-CM | POA: Diagnosis not present

## 2018-02-27 DIAGNOSIS — R41841 Cognitive communication deficit: Secondary | ICD-10-CM | POA: Diagnosis not present

## 2018-02-27 DIAGNOSIS — I4891 Unspecified atrial fibrillation: Secondary | ICD-10-CM | POA: Diagnosis not present

## 2018-02-27 DIAGNOSIS — E785 Hyperlipidemia, unspecified: Secondary | ICD-10-CM | POA: Diagnosis not present

## 2018-02-27 DIAGNOSIS — I2510 Atherosclerotic heart disease of native coronary artery without angina pectoris: Secondary | ICD-10-CM | POA: Diagnosis not present

## 2018-02-28 DIAGNOSIS — I4891 Unspecified atrial fibrillation: Secondary | ICD-10-CM | POA: Diagnosis not present

## 2018-02-28 DIAGNOSIS — I2510 Atherosclerotic heart disease of native coronary artery without angina pectoris: Secondary | ICD-10-CM | POA: Diagnosis not present

## 2018-02-28 DIAGNOSIS — G309 Alzheimer's disease, unspecified: Secondary | ICD-10-CM | POA: Diagnosis not present

## 2018-02-28 DIAGNOSIS — R41841 Cognitive communication deficit: Secondary | ICD-10-CM | POA: Diagnosis not present

## 2018-02-28 DIAGNOSIS — E785 Hyperlipidemia, unspecified: Secondary | ICD-10-CM | POA: Diagnosis not present

## 2018-03-04 DIAGNOSIS — I4891 Unspecified atrial fibrillation: Secondary | ICD-10-CM | POA: Diagnosis not present

## 2018-03-04 DIAGNOSIS — E785 Hyperlipidemia, unspecified: Secondary | ICD-10-CM | POA: Diagnosis not present

## 2018-03-04 DIAGNOSIS — G309 Alzheimer's disease, unspecified: Secondary | ICD-10-CM | POA: Diagnosis not present

## 2018-03-04 DIAGNOSIS — R41841 Cognitive communication deficit: Secondary | ICD-10-CM | POA: Diagnosis not present

## 2018-03-04 DIAGNOSIS — I2510 Atherosclerotic heart disease of native coronary artery without angina pectoris: Secondary | ICD-10-CM | POA: Diagnosis not present

## 2018-03-06 DIAGNOSIS — I2510 Atherosclerotic heart disease of native coronary artery without angina pectoris: Secondary | ICD-10-CM | POA: Diagnosis not present

## 2018-03-06 DIAGNOSIS — E785 Hyperlipidemia, unspecified: Secondary | ICD-10-CM | POA: Diagnosis not present

## 2018-03-06 DIAGNOSIS — G309 Alzheimer's disease, unspecified: Secondary | ICD-10-CM | POA: Diagnosis not present

## 2018-03-06 DIAGNOSIS — R41841 Cognitive communication deficit: Secondary | ICD-10-CM | POA: Diagnosis not present

## 2018-03-06 DIAGNOSIS — I4891 Unspecified atrial fibrillation: Secondary | ICD-10-CM | POA: Diagnosis not present

## 2018-03-11 DIAGNOSIS — R69 Illness, unspecified: Secondary | ICD-10-CM | POA: Diagnosis not present

## 2018-03-11 DIAGNOSIS — R451 Restlessness and agitation: Secondary | ICD-10-CM | POA: Diagnosis not present

## 2018-03-14 ENCOUNTER — Ambulatory Visit: Payer: Medicare HMO | Admitting: Cardiovascular Disease

## 2018-03-14 ENCOUNTER — Encounter: Payer: Self-pay | Admitting: Cardiovascular Disease

## 2018-03-14 VITALS — BP 124/66 | HR 58 | Ht 66.0 in | Wt 164.0 lb

## 2018-03-14 DIAGNOSIS — I2583 Coronary atherosclerosis due to lipid rich plaque: Secondary | ICD-10-CM | POA: Diagnosis not present

## 2018-03-14 DIAGNOSIS — F0390 Unspecified dementia without behavioral disturbance: Secondary | ICD-10-CM | POA: Diagnosis not present

## 2018-03-14 DIAGNOSIS — I252 Old myocardial infarction: Secondary | ICD-10-CM | POA: Diagnosis not present

## 2018-03-14 DIAGNOSIS — R69 Illness, unspecified: Secondary | ICD-10-CM | POA: Diagnosis not present

## 2018-03-14 DIAGNOSIS — I2510 Atherosclerotic heart disease of native coronary artery without angina pectoris: Secondary | ICD-10-CM | POA: Diagnosis not present

## 2018-03-14 DIAGNOSIS — I10 Essential (primary) hypertension: Secondary | ICD-10-CM | POA: Diagnosis not present

## 2018-03-14 DIAGNOSIS — E785 Hyperlipidemia, unspecified: Secondary | ICD-10-CM | POA: Diagnosis not present

## 2018-03-14 DIAGNOSIS — I482 Chronic atrial fibrillation, unspecified: Secondary | ICD-10-CM

## 2018-03-14 NOTE — Patient Instructions (Signed)

## 2018-03-14 NOTE — Progress Notes (Signed)
Patient ID: Joseph Winters, male   DOB: 10/28/1935, 83 y.o.   MRN: [id number]    PCP: Dr. Lavone Orn  HPI: Joseph Winters is a 83 y.o. male who presents to the office today for a 6 month follow up cardiology evaluation.  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 Due to concomitant CAD, he underwent staged intervention to his LAD several days later. His last intervention was in June 1994 for in-stent restenosis at both sites and he underwent repeat PTCA.    A nuclear perfusion study in September 2013 was unchanged from previously and showed moderate to severe inferior scar without associated ischemia and his ejection fraction post stress was 43%. He has a history of hypertension as well as hyperlipidemia.   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 He was hospitalized and was found to have subarachnoid hemorrhage and small contusion. He did experience some confusion initially which resolved. He has seen Dr. Sherley Bounds from neurosurgery. 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 There is a small volume of scattered subarachnoid hemorrhage, small left middle cranial fossa subdural hemorrhage, and probable left temporal lobe hemorrhagic contusion with an associated nondisplaced right occipital and parietal skull fracture.   When I saw him in March 2015 he had noted some forgetfulness and some confusion intermittently. He underwent another CT scan which suggested acute to subacute on chronic right subdural hematoma with mild mass effect which was new. He did not have significant midline shift. There is resolution of prior parenchymal contusions and subarachnoid hemorrhage on the left. He did see Dr.Jones in followup and a head CT demonstrated improvement.   In March 2015, he was found to be in atrial fibrillation when he presented for his echo Doppler  study. Ejection fraction was 35-40%. A nuclear perfusion study showed inferior akinesis, and an ejection fraction of 40% with prior fixed, inferior, inferoseptal scar, without associated ischemia. Due to his prior CNS bleed, he is not a candidate for anticoagulation. His atrial fibrillation rate has been controlled with Toprol-XL 125 mg. I reduced his aspirin to 81 mg daily. He has been on  Toprol-XL 125 g, doxazocin 2 mg, Caduet 5/10 and ramipril10 mg for hypertension. He is tolerated the atorvastatin in Caduet with reference to his hyperlipidemia.  He has been undergoing cognitive rehabilitation. He was reevaluated at [facility] by his neurologist.  He will also be undergoing further evaluation at [facility] memory clinic.  When I last saw him, he had fallen while at [employer].  His wife had gone to get the car and at that time he was holding onto the carriage which then dropped off the curb, he lost his balance and fell.  He was evaluated in the emergency room on 07/29/2016 and  required 5 sutures .  The laceration of the left eyebrow.  CT scan was negative for intracranial injury.  He denied any associated chest pain or dizziness prior to losing his balance when the cart had fallen off the curb.   He completed his evaluation at [facility] cognitive unit.  Since June 2018 been living at at [facility] memory unit which is a locked facility.  When I last saw him he had  had fallen after tripping and fractured his right wrist.  The past months, he has had at least 4 falls.  He denies recurrent chest pain development.  He  had laboratory by Dr. Laurann Montana.  Lipid studies in August 2018 showed an LDL cholesterol at 61.  He is unaware of any palpitations.  He presents for evaluation.  Past Medical History:  Diagnosis Date  . Alzheimer disease   . Atrial fibrillation ([place])   . Coronary artery disease   . Dysrhythmia 2009   24hr holter-PVC'S  . Hypertension 08/28/2008   Echo EF 45-50%  . Myocardial  infarction ([place]) 1993  . Subdural hemorrhage [facility])     Past Surgical History:  Procedure Laterality Date  . CORONARY ANGIOPLASTY     initial inferior MI 1993. 1994 Repeat PTCA    Allergies  Allergen Reactions  . Memantine     Other reaction(s): Confusion (intolerance) Made mental status worse    Current Outpatient Medications  Medication Sig Dispense Refill  . acetaminophen (TYLENOL) 500 MG tablet Take 1-2 tablets (500-1,000 mg total) by mouth every 6 (six) hours as needed for mild pain or moderate pain. 30 tablet 0  . aspirin 81 MG tablet Take 81 mg by mouth at bedtime.     Marland Kitchen atorvastatin (LIPITOR) 10 MG tablet Take 1 tablet (10 mg total) by mouth daily. (Patient taking differently: Take 10 mg by mouth at bedtime. ) 90 tablet 1  . citalopram (CELEXA) 10 MG tablet Take 10 mg by mouth every morning.    . donepezil (ARICEPT) 10 MG tablet Take 10 mg by mouth at bedtime.    Marland Kitchen doxazosin (CARDURA) 1 MG tablet Take 1 tablet (1 mg total) by mouth daily. 90 tablet 2  . isosorbide mononitrate (IMDUR) 60 MG 24 hr tablet Take 1 tablet (60 mg total) by mouth daily. 90 tablet 2  . Melatonin 3 MG TBDP Take 1 tablet by mouth at bedtime.    . Metoprolol Succinate 50 MG CS24 Take 25 mg by mouth daily. 45 capsule 3  . mirtazapine (REMERON) 7.5 MG tablet Take 7.5 mg by mouth at bedtime.    . Multiple Vitamins-Minerals (OCUVITE ADULT FORMULA PO) Take 1 capsule by mouth daily.    . ramipril (ALTACE) 10 MG capsule Take 1 capsule (10 mg total) by mouth daily. 90 capsule 2  . valproic acid (DEPAKENE) 250 MG capsule Take 250 mg by mouth 2 (two) times daily.     No current facility-administered medications for this visit.     Social History   Socioeconomic History  . Marital status: Married    Spouse name: Not on file  . Number of children: Not on file  . Years of education: Not on file  . Highest education level: Not on file  Occupational History  . Not on file  Social Needs  . Financial  resource strain: Not on file  . Food insecurity:    Worry: Not on file    Inability: Not on file  . Transportation needs:    Medical: Not on file    Non-medical: Not on file  Tobacco Use  . Smoking status: Former Smoker    Types: Cigarettes    Last attempt to quit: 12/11/1985    Years since quitting: 32.2  . Smokeless tobacco: Never Used  Substance and Sexual Activity  . Alcohol use: Yes  . Drug use: No  . Sexual activity: Not on file  Lifestyle  . Physical activity:    Days per week: Not on file    Minutes per session: Not on file  . Stress: Not on file  Relationships  . Social connections:    Talks  on phone: Not on file    Gets together: Not on file    Attends religious service: Not on file    Active member of club or organization: Not on file    Attends meetings of clubs or organizations: Not on file    Relationship status: Not on file  . Intimate partner violence:    Fear of current or ex partner: Not on file    Emotionally abused: Not on file    Physically abused: Not on file    Forced sexual activity: Not on file  Other Topics Concern  . Not on file  Social History Narrative  . Not on file   Family history is notable that both parents are deceased. History reviewed. No pertinent family history.  ROS General: Negative; No fevers, chills, or night sweats HEENT: Negative; No changes in vision or hearing, sinus congestion, difficulty swallowing Pulmonary: Negative; No cough, wheezing, shortness of breath, hemoptysis Cardiovascular: See HPI:  GI: Negative; No nausea, vomiting, diarrhea, or abdominal pain GU: Negative; No dysuria, hematuria, or difficulty voiding Musculoskeletal: Status post right wrist fracture. Hematologic: Negative; no easy bruising, bleeding Endocrine: Negative; no heat/cold intolerance; no diabetes, Neuro: Progressive Alzheimer's  Dementia Skin: Negative; No rashes or skin lesions Psychiatric: Negative; No behavioral problems,  depression Sleep: Negative; No snoring,  daytime sleepiness, hypersomnolence, bruxism, restless legs, hypnogognic hallucinations. Other comprehensive 14 point system review is negative   Physical Exam BP 124/66 (BP Location: Left Arm, Patient Position: Sitting, Cuff Size: Normal)   Pulse (!) 58   Ht '5\' 6"'  (1.676 m)   Wt 164 lb (74.4 kg)   BMI 26.47 kg/m    Repeat blood pressure was 122/70  Wt Readings from Last 3 Encounters:  03/14/18 164 lb (74.4 kg)  12/19/17 158 lb 9.6 oz (71.9 kg)  09/12/17 159 lb 3.2 oz (72.2 kg)   General: Alert, oriented, no distress.  Skin: normal turgor, no rashes, warm and dry HEENT: Normocephalic, atraumatic. Pupils equal round and reactive to light; sclera anicteric; extraocular muscles intact;  Nose without nasal septal hypertrophy Mouth/Parynx benign; Mallinpatti scale 3 Neck: No JVD, no carotid bruits; normal carotid upstroke Lungs: clear to ausculatation and percussion; no wheezing or rales Chest wall: without tenderness to palpitation Heart: PMI not displaced, RRR, s1 s2 normal, 1/6 systolic murmur, no diastolic murmur, no rubs, gallops, thrills, or heaves Abdomen: soft, nontender; no hepatosplenomehaly, BS+; abdominal aorta nontender and not dilated by palpation. Back: no CVA tenderness Pulses 2+ Musculoskeletal: full range of motion, normal strength, no joint deformities Extremities: no clubbing cyanosis or edema, Homan's sign negative  Neurologic: grossly nonfocal; Cranial nerves grossly wnl Psychologic: Decreased affect, smiling   ECG (independently read by me): Relation with ventricular rate at approximately 58 bpm.  LVH by voltage.  Poor anterior R wave progression.  QTc interval 445 ms  October 2018 ECG (independently read by me): Atrial fibrillation with a slow ventricular response in the 50s.  Old inferior MI with inferior Q waves.  Poor anterior R-wave progression.  QTc interval 446 ms.  March 2018 ECG (independently read by me):  Atrial fibrillation at 50 bpm.  Left bundle branch block.  Old inferior MI Q waves  September 2017 ECG (independently read by me): Atrial fibrillation with ventricular response at 51 bpm.  Nonspecific interventricular block.  Poor anterior R-wave progression.  Inferior Q waves.  May 2017 ECG (independently read by me): Atrial fibrillation with a reduced ventricular response at 42 bpm.  Her anterior R-wave progression  V1 through V4.  Incomplete left bundle branch block with inferolateral T wave abnormality.  October 2016 ECG (independently read by me): Atrial fibrillation at 60 bpm.  Nonspecific interventricular conduction delay.  Old inferior Q waves.  Poor anterior R-wave progression.  QTc interval 442 ms.  June 2016 ECG (independently read by me):  Atrial fibrillation with a controlled ventricular response in the 60s.  Left bundle branch block with repolarization changes.  December 2015ECG (independently read by me): Atrial fibrillation. Ventricular rate averaging 65 bpm. Old inferior infarction. Poor R wave progression. PVC.   June 2015 ECG today (independently read by me): Atrial fibrillation with a controlled ventricular rate at 64 beats per minute. Old Q waves in leads 3 and aVF. Incomplete left bundle branch block.   ECG 04/02/14 (independently read by me): Atrial fibrillation with ventricular rate at 60. Old inferior Q waves in leads 3 and aVF. Incomplete left bundle branch block   Prior 02/18/2014 ECG (independently read by me): Sinus bradycardia 52 beats per minute with a PAC. There also is T wave inversion in leads V4 through V6 as well as lead 2 and 3. There is left axis deviation. In comparison to his prior ECG one year previously in our office his T wave changes in V4 through V6 are more pronounced.    LABS:  BMP Latest Ref Rng & Units 12/10/2017 07/29/2016 05/04/2016  Glucose 65 - 99 mg/dL 149(H) 96 88  BUN 6 - 20 mg/dL '12 11 10  ' Creatinine 0.61 - 1.24 mg/dL 0.88 0.82 0.84   Sodium 135 - 145 mmol/L 133(L) 130(L) 134(L)  Potassium 3.5 - 5.1 mmol/L 3.5 3.4(L) 4.1  Chloride 101 - 111 mmol/L 102 97(L) 99  CO2 22 - 32 mmol/L '22 24 25  ' Calcium 8.9 - 10.3 mg/dL 8.7(L) 8.9 8.9    Hepatic Function Latest Ref Rng & Units 05/04/2016 01/21/2014  Total Protein 6.1 - 8.1 g/dL 6.4 6.6  Albumin 3.6 - 5.1 g/dL 4.2 4.0  AST 10 - 35 U/L 24 24  ALT 9 - 46 U/L 23 19  Alk Phosphatase 40 - 115 U/L 54 53  Total Bilirubin 0.2 - 1.2 mg/dL 1.1 0.6    CBC Latest Ref Rng & Units 12/10/2017 07/29/2016 05/04/2016  WBC 4.0 - 10.5 K/uL 11.5(H) 7.3 5.6  Hemoglobin 13.0 - 17.0 g/dL 15.5 13.9 14.8  Hematocrit 39.0 - 52.0 % 44.5 40.6 43.9  Platelets 150 - 400 K/uL 201 203 215   Lab Results  Component Value Date   MCV 90.4 12/10/2017   MCV 90.6 07/29/2016   MCV 89.8 05/04/2016    Lab Results  Component Value Date   TSH 2.27 05/04/2016    BNP No results found for: BNP  ProBNP No results found for: PROBNP   Lipid Panel     Component Value Date/Time   CHOL 151 05/04/2016 1020   TRIG 52 05/04/2016 1020   HDL 84 05/04/2016 1020   CHOLHDL 1.8 05/04/2016 1020   VLDL 10 05/04/2016 1020   LDLCALC 57 05/04/2016 1020     RADIOLOGY: No results found.  IMPRESSION:  1. Chronic atrial fibrillation ([place])   2. Coronary artery disease due to lipid rich plaque   3. Essential hypertension   4. Old MI (myocardial infarction)   5. Hyperlipidemia with target LDL less than 70   6. Dementia without behavioral disturbance, unspecified dementia type     ASSESSMENT AND PLAN: Joseph Winters is an 68 -year-old gentleman who is 26 years status  post inferolateral wall MI treated with PCI to his RCA and stage intervention to his LAD in November 1993. He developed in-stent restenosis one year later in June 1994 but has been stable with reference to his CAD.  His last nuclear study showed inferior scar with an EF of 40%. He has permanent atrial fibrillation.  Blood pressure today is stable on  Toprol-XL 25 mg daily, ramipril 10 mg continues to take isosorbide mononitrate.  He is not having any recurrent anginal symptomatology.  He continues to be on atorvastatin 10 mg for hyperlipidemia with laboratory in August 2018 showing an LDL cholesterol at 61.  He is on  mirtazapine and Celexa for depression.  He has unsteady gait and walks with a walker. He has had several falls because of fall risk is not a candidate for systemic anticoagulation.  Last saw him, he had brought with him a copy of his living will as well as general power of attorney which were scanned and put into epic.  I will see him in 1 year for reevaluation or sooner if problems arise. Spent: 25 minutes   Troy Sine, MD, [facility]  03/16/2018 9:29 AM

## 2018-03-16 ENCOUNTER — Encounter: Payer: Self-pay | Admitting: Cardiovascular Disease

## 2018-04-10 DIAGNOSIS — R451 Restlessness and agitation: Secondary | ICD-10-CM | POA: Diagnosis not present

## 2018-04-10 DIAGNOSIS — R69 Illness, unspecified: Secondary | ICD-10-CM | POA: Diagnosis not present

## 2018-04-17 DIAGNOSIS — I482 Chronic atrial fibrillation: Secondary | ICD-10-CM | POA: Diagnosis not present

## 2018-04-17 DIAGNOSIS — S060X1A Concussion with loss of consciousness of 30 minutes or less, initial encounter: Secondary | ICD-10-CM | POA: Diagnosis not present

## 2018-04-17 DIAGNOSIS — R3981 Functional urinary incontinence: Secondary | ICD-10-CM | POA: Diagnosis not present

## 2018-04-17 DIAGNOSIS — I2510 Atherosclerotic heart disease of native coronary artery without angina pectoris: Secondary | ICD-10-CM | POA: Diagnosis not present

## 2018-04-17 DIAGNOSIS — R269 Unspecified abnormalities of gait and mobility: Secondary | ICD-10-CM | POA: Diagnosis not present

## 2018-04-17 DIAGNOSIS — R203 Hyperesthesia: Secondary | ICD-10-CM | POA: Diagnosis not present

## 2018-04-17 DIAGNOSIS — I10 Essential (primary) hypertension: Secondary | ICD-10-CM | POA: Diagnosis not present

## 2018-04-17 DIAGNOSIS — R634 Abnormal weight loss: Secondary | ICD-10-CM | POA: Diagnosis not present

## 2018-04-19 DIAGNOSIS — Z79899 Other long term (current) drug therapy: Secondary | ICD-10-CM | POA: Diagnosis not present

## 2018-04-23 DIAGNOSIS — Z79899 Other long term (current) drug therapy: Secondary | ICD-10-CM | POA: Diagnosis not present

## 2018-04-23 DIAGNOSIS — R69 Illness, unspecified: Secondary | ICD-10-CM | POA: Diagnosis not present

## 2018-04-23 DIAGNOSIS — L723 Sebaceous cyst: Secondary | ICD-10-CM | POA: Diagnosis not present

## 2018-04-23 DIAGNOSIS — S060X0A Concussion without loss of consciousness, initial encounter: Secondary | ICD-10-CM | POA: Diagnosis not present

## 2018-04-30 DIAGNOSIS — R69 Illness, unspecified: Secondary | ICD-10-CM | POA: Diagnosis not present

## 2018-04-30 DIAGNOSIS — Z79899 Other long term (current) drug therapy: Secondary | ICD-10-CM | POA: Diagnosis not present

## 2018-04-30 DIAGNOSIS — S060X0A Concussion without loss of consciousness, initial encounter: Secondary | ICD-10-CM | POA: Diagnosis not present

## 2018-05-08 DIAGNOSIS — I2510 Atherosclerotic heart disease of native coronary artery without angina pectoris: Secondary | ICD-10-CM | POA: Diagnosis not present

## 2018-05-08 DIAGNOSIS — Z79899 Other long term (current) drug therapy: Secondary | ICD-10-CM | POA: Diagnosis not present

## 2018-05-08 DIAGNOSIS — R269 Unspecified abnormalities of gait and mobility: Secondary | ICD-10-CM | POA: Diagnosis not present

## 2018-05-08 DIAGNOSIS — R69 Illness, unspecified: Secondary | ICD-10-CM | POA: Diagnosis not present

## 2018-05-08 DIAGNOSIS — S060X0A Concussion without loss of consciousness, initial encounter: Secondary | ICD-10-CM | POA: Diagnosis not present

## 2018-05-10 DIAGNOSIS — G309 Alzheimer's disease, unspecified: Secondary | ICD-10-CM | POA: Diagnosis not present

## 2018-05-10 DIAGNOSIS — I10 Essential (primary) hypertension: Secondary | ICD-10-CM | POA: Diagnosis not present

## 2018-05-10 DIAGNOSIS — N400 Enlarged prostate without lower urinary tract symptoms: Secondary | ICD-10-CM | POA: Diagnosis not present

## 2018-05-10 DIAGNOSIS — R69 Illness, unspecified: Secondary | ICD-10-CM | POA: Diagnosis not present

## 2018-05-10 DIAGNOSIS — I2510 Atherosclerotic heart disease of native coronary artery without angina pectoris: Secondary | ICD-10-CM | POA: Diagnosis not present

## 2018-05-10 DIAGNOSIS — I4891 Unspecified atrial fibrillation: Secondary | ICD-10-CM | POA: Diagnosis not present

## 2018-05-13 DIAGNOSIS — N400 Enlarged prostate without lower urinary tract symptoms: Secondary | ICD-10-CM | POA: Diagnosis not present

## 2018-05-13 DIAGNOSIS — R3915 Urgency of urination: Secondary | ICD-10-CM | POA: Diagnosis not present

## 2018-05-13 DIAGNOSIS — I4891 Unspecified atrial fibrillation: Secondary | ICD-10-CM | POA: Diagnosis not present

## 2018-05-13 DIAGNOSIS — R69 Illness, unspecified: Secondary | ICD-10-CM | POA: Diagnosis not present

## 2018-05-13 DIAGNOSIS — G309 Alzheimer's disease, unspecified: Secondary | ICD-10-CM | POA: Diagnosis not present

## 2018-05-13 DIAGNOSIS — I2510 Atherosclerotic heart disease of native coronary artery without angina pectoris: Secondary | ICD-10-CM | POA: Diagnosis not present

## 2018-05-13 DIAGNOSIS — I10 Essential (primary) hypertension: Secondary | ICD-10-CM | POA: Diagnosis not present

## 2018-05-14 DIAGNOSIS — I10 Essential (primary) hypertension: Secondary | ICD-10-CM | POA: Diagnosis not present

## 2018-05-14 DIAGNOSIS — G309 Alzheimer's disease, unspecified: Secondary | ICD-10-CM | POA: Diagnosis not present

## 2018-05-14 DIAGNOSIS — I4891 Unspecified atrial fibrillation: Secondary | ICD-10-CM | POA: Diagnosis not present

## 2018-05-14 DIAGNOSIS — I2510 Atherosclerotic heart disease of native coronary artery without angina pectoris: Secondary | ICD-10-CM | POA: Diagnosis not present

## 2018-05-14 DIAGNOSIS — N400 Enlarged prostate without lower urinary tract symptoms: Secondary | ICD-10-CM | POA: Diagnosis not present

## 2018-05-14 DIAGNOSIS — R69 Illness, unspecified: Secondary | ICD-10-CM | POA: Diagnosis not present

## 2018-05-16 DIAGNOSIS — R69 Illness, unspecified: Secondary | ICD-10-CM | POA: Diagnosis not present

## 2018-05-16 DIAGNOSIS — N400 Enlarged prostate without lower urinary tract symptoms: Secondary | ICD-10-CM | POA: Diagnosis not present

## 2018-05-16 DIAGNOSIS — G309 Alzheimer's disease, unspecified: Secondary | ICD-10-CM | POA: Diagnosis not present

## 2018-05-16 DIAGNOSIS — I10 Essential (primary) hypertension: Secondary | ICD-10-CM | POA: Diagnosis not present

## 2018-05-16 DIAGNOSIS — I4891 Unspecified atrial fibrillation: Secondary | ICD-10-CM | POA: Diagnosis not present

## 2018-05-16 DIAGNOSIS — I2510 Atherosclerotic heart disease of native coronary artery without angina pectoris: Secondary | ICD-10-CM | POA: Diagnosis not present

## 2018-05-17 DIAGNOSIS — R69 Illness, unspecified: Secondary | ICD-10-CM | POA: Diagnosis not present

## 2018-05-17 DIAGNOSIS — R400 Somnolence: Secondary | ICD-10-CM | POA: Diagnosis not present

## 2018-05-21 DIAGNOSIS — G309 Alzheimer's disease, unspecified: Secondary | ICD-10-CM | POA: Diagnosis not present

## 2018-05-21 DIAGNOSIS — I10 Essential (primary) hypertension: Secondary | ICD-10-CM | POA: Diagnosis not present

## 2018-05-21 DIAGNOSIS — I4891 Unspecified atrial fibrillation: Secondary | ICD-10-CM | POA: Diagnosis not present

## 2018-05-21 DIAGNOSIS — N400 Enlarged prostate without lower urinary tract symptoms: Secondary | ICD-10-CM | POA: Diagnosis not present

## 2018-05-21 DIAGNOSIS — I2510 Atherosclerotic heart disease of native coronary artery without angina pectoris: Secondary | ICD-10-CM | POA: Diagnosis not present

## 2018-05-21 DIAGNOSIS — R69 Illness, unspecified: Secondary | ICD-10-CM | POA: Diagnosis not present

## 2018-05-22 DIAGNOSIS — Z79899 Other long term (current) drug therapy: Secondary | ICD-10-CM | POA: Diagnosis not present

## 2018-05-22 DIAGNOSIS — I119 Hypertensive heart disease without heart failure: Secondary | ICD-10-CM | POA: Diagnosis not present

## 2018-05-22 DIAGNOSIS — I2510 Atherosclerotic heart disease of native coronary artery without angina pectoris: Secondary | ICD-10-CM | POA: Diagnosis not present

## 2018-05-22 DIAGNOSIS — R69 Illness, unspecified: Secondary | ICD-10-CM | POA: Diagnosis not present

## 2018-05-23 DIAGNOSIS — I10 Essential (primary) hypertension: Secondary | ICD-10-CM | POA: Diagnosis not present

## 2018-05-23 DIAGNOSIS — G309 Alzheimer's disease, unspecified: Secondary | ICD-10-CM | POA: Diagnosis not present

## 2018-05-23 DIAGNOSIS — N400 Enlarged prostate without lower urinary tract symptoms: Secondary | ICD-10-CM | POA: Diagnosis not present

## 2018-05-23 DIAGNOSIS — I2510 Atherosclerotic heart disease of native coronary artery without angina pectoris: Secondary | ICD-10-CM | POA: Diagnosis not present

## 2018-05-23 DIAGNOSIS — I4891 Unspecified atrial fibrillation: Secondary | ICD-10-CM | POA: Diagnosis not present

## 2018-05-23 DIAGNOSIS — R69 Illness, unspecified: Secondary | ICD-10-CM | POA: Diagnosis not present

## 2018-05-28 DIAGNOSIS — I10 Essential (primary) hypertension: Secondary | ICD-10-CM | POA: Diagnosis not present

## 2018-05-28 DIAGNOSIS — N400 Enlarged prostate without lower urinary tract symptoms: Secondary | ICD-10-CM | POA: Diagnosis not present

## 2018-05-28 DIAGNOSIS — I2510 Atherosclerotic heart disease of native coronary artery without angina pectoris: Secondary | ICD-10-CM | POA: Diagnosis not present

## 2018-05-28 DIAGNOSIS — G309 Alzheimer's disease, unspecified: Secondary | ICD-10-CM | POA: Diagnosis not present

## 2018-05-28 DIAGNOSIS — I4891 Unspecified atrial fibrillation: Secondary | ICD-10-CM | POA: Diagnosis not present

## 2018-05-28 DIAGNOSIS — R69 Illness, unspecified: Secondary | ICD-10-CM | POA: Diagnosis not present

## 2018-05-29 DIAGNOSIS — R69 Illness, unspecified: Secondary | ICD-10-CM | POA: Diagnosis not present

## 2018-05-29 DIAGNOSIS — G3109 Other frontotemporal dementia: Secondary | ICD-10-CM | POA: Diagnosis not present

## 2018-05-29 DIAGNOSIS — I119 Hypertensive heart disease without heart failure: Secondary | ICD-10-CM | POA: Diagnosis not present

## 2018-05-31 DIAGNOSIS — I2510 Atherosclerotic heart disease of native coronary artery without angina pectoris: Secondary | ICD-10-CM | POA: Diagnosis not present

## 2018-05-31 DIAGNOSIS — I4891 Unspecified atrial fibrillation: Secondary | ICD-10-CM | POA: Diagnosis not present

## 2018-05-31 DIAGNOSIS — I10 Essential (primary) hypertension: Secondary | ICD-10-CM | POA: Diagnosis not present

## 2018-05-31 DIAGNOSIS — G309 Alzheimer's disease, unspecified: Secondary | ICD-10-CM | POA: Diagnosis not present

## 2018-05-31 DIAGNOSIS — N400 Enlarged prostate without lower urinary tract symptoms: Secondary | ICD-10-CM | POA: Diagnosis not present

## 2018-05-31 DIAGNOSIS — R69 Illness, unspecified: Secondary | ICD-10-CM | POA: Diagnosis not present

## 2018-06-04 DIAGNOSIS — N400 Enlarged prostate without lower urinary tract symptoms: Secondary | ICD-10-CM | POA: Diagnosis not present

## 2018-06-04 DIAGNOSIS — R69 Illness, unspecified: Secondary | ICD-10-CM | POA: Diagnosis not present

## 2018-06-04 DIAGNOSIS — I4891 Unspecified atrial fibrillation: Secondary | ICD-10-CM | POA: Diagnosis not present

## 2018-06-04 DIAGNOSIS — I2510 Atherosclerotic heart disease of native coronary artery without angina pectoris: Secondary | ICD-10-CM | POA: Diagnosis not present

## 2018-06-04 DIAGNOSIS — G309 Alzheimer's disease, unspecified: Secondary | ICD-10-CM | POA: Diagnosis not present

## 2018-06-04 DIAGNOSIS — I10 Essential (primary) hypertension: Secondary | ICD-10-CM | POA: Diagnosis not present

## 2018-06-06 DIAGNOSIS — G309 Alzheimer's disease, unspecified: Secondary | ICD-10-CM | POA: Diagnosis not present

## 2018-06-06 DIAGNOSIS — N400 Enlarged prostate without lower urinary tract symptoms: Secondary | ICD-10-CM | POA: Diagnosis not present

## 2018-06-06 DIAGNOSIS — R69 Illness, unspecified: Secondary | ICD-10-CM | POA: Diagnosis not present

## 2018-06-06 DIAGNOSIS — I10 Essential (primary) hypertension: Secondary | ICD-10-CM | POA: Diagnosis not present

## 2018-06-06 DIAGNOSIS — I4891 Unspecified atrial fibrillation: Secondary | ICD-10-CM | POA: Diagnosis not present

## 2018-06-06 DIAGNOSIS — I2510 Atherosclerotic heart disease of native coronary artery without angina pectoris: Secondary | ICD-10-CM | POA: Diagnosis not present

## 2018-06-12 DIAGNOSIS — N400 Enlarged prostate without lower urinary tract symptoms: Secondary | ICD-10-CM | POA: Diagnosis not present

## 2018-06-12 DIAGNOSIS — I4891 Unspecified atrial fibrillation: Secondary | ICD-10-CM | POA: Diagnosis not present

## 2018-06-12 DIAGNOSIS — I10 Essential (primary) hypertension: Secondary | ICD-10-CM | POA: Diagnosis not present

## 2018-06-12 DIAGNOSIS — I2510 Atherosclerotic heart disease of native coronary artery without angina pectoris: Secondary | ICD-10-CM | POA: Diagnosis not present

## 2018-06-12 DIAGNOSIS — R69 Illness, unspecified: Secondary | ICD-10-CM | POA: Diagnosis not present

## 2018-06-12 DIAGNOSIS — G309 Alzheimer's disease, unspecified: Secondary | ICD-10-CM | POA: Diagnosis not present

## 2018-06-14 DIAGNOSIS — I2510 Atherosclerotic heart disease of native coronary artery without angina pectoris: Secondary | ICD-10-CM | POA: Diagnosis not present

## 2018-06-14 DIAGNOSIS — G309 Alzheimer's disease, unspecified: Secondary | ICD-10-CM | POA: Diagnosis not present

## 2018-06-14 DIAGNOSIS — I4891 Unspecified atrial fibrillation: Secondary | ICD-10-CM | POA: Diagnosis not present

## 2018-06-14 DIAGNOSIS — N400 Enlarged prostate without lower urinary tract symptoms: Secondary | ICD-10-CM | POA: Diagnosis not present

## 2018-06-14 DIAGNOSIS — R69 Illness, unspecified: Secondary | ICD-10-CM | POA: Diagnosis not present

## 2018-06-14 DIAGNOSIS — I10 Essential (primary) hypertension: Secondary | ICD-10-CM | POA: Diagnosis not present

## 2018-06-17 DIAGNOSIS — R69 Illness, unspecified: Secondary | ICD-10-CM | POA: Diagnosis not present

## 2018-06-17 DIAGNOSIS — I4891 Unspecified atrial fibrillation: Secondary | ICD-10-CM | POA: Diagnosis not present

## 2018-06-17 DIAGNOSIS — G309 Alzheimer's disease, unspecified: Secondary | ICD-10-CM | POA: Diagnosis not present

## 2018-06-17 DIAGNOSIS — I10 Essential (primary) hypertension: Secondary | ICD-10-CM | POA: Diagnosis not present

## 2018-06-17 DIAGNOSIS — N400 Enlarged prostate without lower urinary tract symptoms: Secondary | ICD-10-CM | POA: Diagnosis not present

## 2018-06-17 DIAGNOSIS — I2510 Atherosclerotic heart disease of native coronary artery without angina pectoris: Secondary | ICD-10-CM | POA: Diagnosis not present

## 2018-06-20 DIAGNOSIS — I4891 Unspecified atrial fibrillation: Secondary | ICD-10-CM | POA: Diagnosis not present

## 2018-06-20 DIAGNOSIS — N400 Enlarged prostate without lower urinary tract symptoms: Secondary | ICD-10-CM | POA: Diagnosis not present

## 2018-06-20 DIAGNOSIS — G309 Alzheimer's disease, unspecified: Secondary | ICD-10-CM | POA: Diagnosis not present

## 2018-06-20 DIAGNOSIS — I2510 Atherosclerotic heart disease of native coronary artery without angina pectoris: Secondary | ICD-10-CM | POA: Diagnosis not present

## 2018-06-20 DIAGNOSIS — R69 Illness, unspecified: Secondary | ICD-10-CM | POA: Diagnosis not present

## 2018-06-20 DIAGNOSIS — I10 Essential (primary) hypertension: Secondary | ICD-10-CM | POA: Diagnosis not present

## 2018-06-25 DIAGNOSIS — G309 Alzheimer's disease, unspecified: Secondary | ICD-10-CM | POA: Diagnosis not present

## 2018-06-25 DIAGNOSIS — R69 Illness, unspecified: Secondary | ICD-10-CM | POA: Diagnosis not present

## 2018-06-25 DIAGNOSIS — I10 Essential (primary) hypertension: Secondary | ICD-10-CM | POA: Diagnosis not present

## 2018-06-25 DIAGNOSIS — Z79899 Other long term (current) drug therapy: Secondary | ICD-10-CM | POA: Diagnosis not present

## 2018-06-25 DIAGNOSIS — G3109 Other frontotemporal dementia: Secondary | ICD-10-CM | POA: Diagnosis not present

## 2018-06-25 DIAGNOSIS — R269 Unspecified abnormalities of gait and mobility: Secondary | ICD-10-CM | POA: Diagnosis not present

## 2018-06-25 DIAGNOSIS — N400 Enlarged prostate without lower urinary tract symptoms: Secondary | ICD-10-CM | POA: Diagnosis not present

## 2018-06-25 DIAGNOSIS — I2510 Atherosclerotic heart disease of native coronary artery without angina pectoris: Secondary | ICD-10-CM | POA: Diagnosis not present

## 2018-06-25 DIAGNOSIS — I4891 Unspecified atrial fibrillation: Secondary | ICD-10-CM | POA: Diagnosis not present

## 2018-06-27 DIAGNOSIS — R69 Illness, unspecified: Secondary | ICD-10-CM | POA: Diagnosis not present

## 2018-06-27 DIAGNOSIS — I2510 Atherosclerotic heart disease of native coronary artery without angina pectoris: Secondary | ICD-10-CM | POA: Diagnosis not present

## 2018-06-27 DIAGNOSIS — N400 Enlarged prostate without lower urinary tract symptoms: Secondary | ICD-10-CM | POA: Diagnosis not present

## 2018-06-27 DIAGNOSIS — I10 Essential (primary) hypertension: Secondary | ICD-10-CM | POA: Diagnosis not present

## 2018-06-27 DIAGNOSIS — I4891 Unspecified atrial fibrillation: Secondary | ICD-10-CM | POA: Diagnosis not present

## 2018-06-27 DIAGNOSIS — G309 Alzheimer's disease, unspecified: Secondary | ICD-10-CM | POA: Diagnosis not present

## 2018-07-04 DIAGNOSIS — I4891 Unspecified atrial fibrillation: Secondary | ICD-10-CM | POA: Diagnosis not present

## 2018-07-04 DIAGNOSIS — R69 Illness, unspecified: Secondary | ICD-10-CM | POA: Diagnosis not present

## 2018-07-04 DIAGNOSIS — I2510 Atherosclerotic heart disease of native coronary artery without angina pectoris: Secondary | ICD-10-CM | POA: Diagnosis not present

## 2018-07-04 DIAGNOSIS — G309 Alzheimer's disease, unspecified: Secondary | ICD-10-CM | POA: Diagnosis not present

## 2018-07-04 DIAGNOSIS — I10 Essential (primary) hypertension: Secondary | ICD-10-CM | POA: Diagnosis not present

## 2018-07-04 DIAGNOSIS — N400 Enlarged prostate without lower urinary tract symptoms: Secondary | ICD-10-CM | POA: Diagnosis not present

## 2018-07-05 DIAGNOSIS — R69 Illness, unspecified: Secondary | ICD-10-CM | POA: Diagnosis not present

## 2018-07-05 DIAGNOSIS — I4891 Unspecified atrial fibrillation: Secondary | ICD-10-CM | POA: Diagnosis not present

## 2018-07-05 DIAGNOSIS — I10 Essential (primary) hypertension: Secondary | ICD-10-CM | POA: Diagnosis not present

## 2018-07-05 DIAGNOSIS — N400 Enlarged prostate without lower urinary tract symptoms: Secondary | ICD-10-CM | POA: Diagnosis not present

## 2018-07-05 DIAGNOSIS — I2510 Atherosclerotic heart disease of native coronary artery without angina pectoris: Secondary | ICD-10-CM | POA: Diagnosis not present

## 2018-07-05 DIAGNOSIS — G309 Alzheimer's disease, unspecified: Secondary | ICD-10-CM | POA: Diagnosis not present

## 2018-07-09 DIAGNOSIS — I2510 Atherosclerotic heart disease of native coronary artery without angina pectoris: Secondary | ICD-10-CM | POA: Diagnosis not present

## 2018-07-09 DIAGNOSIS — G309 Alzheimer's disease, unspecified: Secondary | ICD-10-CM | POA: Diagnosis not present

## 2018-07-09 DIAGNOSIS — I10 Essential (primary) hypertension: Secondary | ICD-10-CM | POA: Diagnosis not present

## 2018-07-09 DIAGNOSIS — N400 Enlarged prostate without lower urinary tract symptoms: Secondary | ICD-10-CM | POA: Diagnosis not present

## 2018-07-09 DIAGNOSIS — R69 Illness, unspecified: Secondary | ICD-10-CM | POA: Diagnosis not present

## 2018-07-09 DIAGNOSIS — I4891 Unspecified atrial fibrillation: Secondary | ICD-10-CM | POA: Diagnosis not present

## 2018-07-10 DIAGNOSIS — N400 Enlarged prostate without lower urinary tract symptoms: Secondary | ICD-10-CM | POA: Diagnosis not present

## 2018-07-10 DIAGNOSIS — R69 Illness, unspecified: Secondary | ICD-10-CM | POA: Diagnosis not present

## 2018-07-10 DIAGNOSIS — G309 Alzheimer's disease, unspecified: Secondary | ICD-10-CM | POA: Diagnosis not present

## 2018-07-10 DIAGNOSIS — I10 Essential (primary) hypertension: Secondary | ICD-10-CM | POA: Diagnosis not present

## 2018-07-10 DIAGNOSIS — I2510 Atherosclerotic heart disease of native coronary artery without angina pectoris: Secondary | ICD-10-CM | POA: Diagnosis not present

## 2018-07-10 DIAGNOSIS — I4891 Unspecified atrial fibrillation: Secondary | ICD-10-CM | POA: Diagnosis not present

## 2018-07-13 DIAGNOSIS — Z79899 Other long term (current) drug therapy: Secondary | ICD-10-CM | POA: Diagnosis not present

## 2018-07-16 DIAGNOSIS — I4891 Unspecified atrial fibrillation: Secondary | ICD-10-CM | POA: Diagnosis not present

## 2018-07-16 DIAGNOSIS — N400 Enlarged prostate without lower urinary tract symptoms: Secondary | ICD-10-CM | POA: Diagnosis not present

## 2018-07-16 DIAGNOSIS — G309 Alzheimer's disease, unspecified: Secondary | ICD-10-CM | POA: Diagnosis not present

## 2018-07-16 DIAGNOSIS — R69 Illness, unspecified: Secondary | ICD-10-CM | POA: Diagnosis not present

## 2018-07-16 DIAGNOSIS — Z79899 Other long term (current) drug therapy: Secondary | ICD-10-CM | POA: Diagnosis not present

## 2018-07-16 DIAGNOSIS — R319 Hematuria, unspecified: Secondary | ICD-10-CM | POA: Diagnosis not present

## 2018-07-16 DIAGNOSIS — I10 Essential (primary) hypertension: Secondary | ICD-10-CM | POA: Diagnosis not present

## 2018-07-16 DIAGNOSIS — G3109 Other frontotemporal dementia: Secondary | ICD-10-CM | POA: Diagnosis not present

## 2018-07-16 DIAGNOSIS — I2510 Atherosclerotic heart disease of native coronary artery without angina pectoris: Secondary | ICD-10-CM | POA: Diagnosis not present

## 2018-07-18 DIAGNOSIS — R69 Illness, unspecified: Secondary | ICD-10-CM | POA: Diagnosis not present

## 2018-07-18 DIAGNOSIS — I2510 Atherosclerotic heart disease of native coronary artery without angina pectoris: Secondary | ICD-10-CM | POA: Diagnosis not present

## 2018-07-18 DIAGNOSIS — I10 Essential (primary) hypertension: Secondary | ICD-10-CM | POA: Diagnosis not present

## 2018-07-18 DIAGNOSIS — G309 Alzheimer's disease, unspecified: Secondary | ICD-10-CM | POA: Diagnosis not present

## 2018-07-18 DIAGNOSIS — I4891 Unspecified atrial fibrillation: Secondary | ICD-10-CM | POA: Diagnosis not present

## 2018-07-18 DIAGNOSIS — N400 Enlarged prostate without lower urinary tract symptoms: Secondary | ICD-10-CM | POA: Diagnosis not present

## 2018-07-23 DIAGNOSIS — I10 Essential (primary) hypertension: Secondary | ICD-10-CM | POA: Diagnosis not present

## 2018-07-23 DIAGNOSIS — I4891 Unspecified atrial fibrillation: Secondary | ICD-10-CM | POA: Diagnosis not present

## 2018-07-23 DIAGNOSIS — G309 Alzheimer's disease, unspecified: Secondary | ICD-10-CM | POA: Diagnosis not present

## 2018-07-23 DIAGNOSIS — R269 Unspecified abnormalities of gait and mobility: Secondary | ICD-10-CM | POA: Diagnosis not present

## 2018-07-23 DIAGNOSIS — R69 Illness, unspecified: Secondary | ICD-10-CM | POA: Diagnosis not present

## 2018-07-23 DIAGNOSIS — N400 Enlarged prostate without lower urinary tract symptoms: Secondary | ICD-10-CM | POA: Diagnosis not present

## 2018-07-23 DIAGNOSIS — I2510 Atherosclerotic heart disease of native coronary artery without angina pectoris: Secondary | ICD-10-CM | POA: Diagnosis not present

## 2018-07-23 DIAGNOSIS — G3109 Other frontotemporal dementia: Secondary | ICD-10-CM | POA: Diagnosis not present

## 2018-07-23 DIAGNOSIS — Z79899 Other long term (current) drug therapy: Secondary | ICD-10-CM | POA: Diagnosis not present

## 2018-07-23 DIAGNOSIS — R531 Weakness: Secondary | ICD-10-CM | POA: Diagnosis not present

## 2018-07-23 DIAGNOSIS — R319 Hematuria, unspecified: Secondary | ICD-10-CM | POA: Diagnosis not present

## 2018-07-25 DIAGNOSIS — R69 Illness, unspecified: Secondary | ICD-10-CM | POA: Diagnosis not present

## 2018-07-25 DIAGNOSIS — I4891 Unspecified atrial fibrillation: Secondary | ICD-10-CM | POA: Diagnosis not present

## 2018-07-25 DIAGNOSIS — N400 Enlarged prostate without lower urinary tract symptoms: Secondary | ICD-10-CM | POA: Diagnosis not present

## 2018-07-25 DIAGNOSIS — G309 Alzheimer's disease, unspecified: Secondary | ICD-10-CM | POA: Diagnosis not present

## 2018-07-25 DIAGNOSIS — I10 Essential (primary) hypertension: Secondary | ICD-10-CM | POA: Diagnosis not present

## 2018-07-25 DIAGNOSIS — I2510 Atherosclerotic heart disease of native coronary artery without angina pectoris: Secondary | ICD-10-CM | POA: Diagnosis not present

## 2018-07-29 DIAGNOSIS — R319 Hematuria, unspecified: Secondary | ICD-10-CM | POA: Diagnosis not present

## 2018-08-02 DIAGNOSIS — I2510 Atherosclerotic heart disease of native coronary artery without angina pectoris: Secondary | ICD-10-CM | POA: Diagnosis not present

## 2018-08-02 DIAGNOSIS — G309 Alzheimer's disease, unspecified: Secondary | ICD-10-CM | POA: Diagnosis not present

## 2018-08-02 DIAGNOSIS — I4891 Unspecified atrial fibrillation: Secondary | ICD-10-CM | POA: Diagnosis not present

## 2018-08-02 DIAGNOSIS — N400 Enlarged prostate without lower urinary tract symptoms: Secondary | ICD-10-CM | POA: Diagnosis not present

## 2018-08-02 DIAGNOSIS — I10 Essential (primary) hypertension: Secondary | ICD-10-CM | POA: Diagnosis not present

## 2018-08-02 DIAGNOSIS — R69 Illness, unspecified: Secondary | ICD-10-CM | POA: Diagnosis not present

## 2018-08-06 DIAGNOSIS — I4891 Unspecified atrial fibrillation: Secondary | ICD-10-CM | POA: Diagnosis not present

## 2018-08-06 DIAGNOSIS — G309 Alzheimer's disease, unspecified: Secondary | ICD-10-CM | POA: Diagnosis not present

## 2018-08-06 DIAGNOSIS — R69 Illness, unspecified: Secondary | ICD-10-CM | POA: Diagnosis not present

## 2018-08-06 DIAGNOSIS — N400 Enlarged prostate without lower urinary tract symptoms: Secondary | ICD-10-CM | POA: Diagnosis not present

## 2018-08-06 DIAGNOSIS — I2510 Atherosclerotic heart disease of native coronary artery without angina pectoris: Secondary | ICD-10-CM | POA: Diagnosis not present

## 2018-08-06 DIAGNOSIS — I10 Essential (primary) hypertension: Secondary | ICD-10-CM | POA: Diagnosis not present

## 2018-08-09 DIAGNOSIS — S065X0S Traumatic subdural hemorrhage without loss of consciousness, sequela: Secondary | ICD-10-CM | POA: Diagnosis not present

## 2018-08-09 DIAGNOSIS — R269 Unspecified abnormalities of gait and mobility: Secondary | ICD-10-CM | POA: Diagnosis not present

## 2018-08-09 DIAGNOSIS — G301 Alzheimer's disease with late onset: Secondary | ICD-10-CM | POA: Diagnosis not present

## 2018-08-09 DIAGNOSIS — G309 Alzheimer's disease, unspecified: Secondary | ICD-10-CM | POA: Diagnosis not present

## 2018-08-09 DIAGNOSIS — I119 Hypertensive heart disease without heart failure: Secondary | ICD-10-CM | POA: Diagnosis not present

## 2018-08-09 DIAGNOSIS — N400 Enlarged prostate without lower urinary tract symptoms: Secondary | ICD-10-CM | POA: Diagnosis not present

## 2018-08-09 DIAGNOSIS — I10 Essential (primary) hypertension: Secondary | ICD-10-CM | POA: Diagnosis not present

## 2018-08-09 DIAGNOSIS — R69 Illness, unspecified: Secondary | ICD-10-CM | POA: Diagnosis not present

## 2018-08-09 DIAGNOSIS — I4891 Unspecified atrial fibrillation: Secondary | ICD-10-CM | POA: Diagnosis not present

## 2018-08-09 DIAGNOSIS — I2510 Atherosclerotic heart disease of native coronary artery without angina pectoris: Secondary | ICD-10-CM | POA: Diagnosis not present

## 2018-08-14 DIAGNOSIS — I10 Essential (primary) hypertension: Secondary | ICD-10-CM | POA: Diagnosis not present

## 2018-08-14 DIAGNOSIS — N400 Enlarged prostate without lower urinary tract symptoms: Secondary | ICD-10-CM | POA: Diagnosis not present

## 2018-08-14 DIAGNOSIS — I2510 Atherosclerotic heart disease of native coronary artery without angina pectoris: Secondary | ICD-10-CM | POA: Diagnosis not present

## 2018-08-14 DIAGNOSIS — I4891 Unspecified atrial fibrillation: Secondary | ICD-10-CM | POA: Diagnosis not present

## 2018-08-14 DIAGNOSIS — G309 Alzheimer's disease, unspecified: Secondary | ICD-10-CM | POA: Diagnosis not present

## 2018-08-14 DIAGNOSIS — R69 Illness, unspecified: Secondary | ICD-10-CM | POA: Diagnosis not present

## 2018-08-16 DIAGNOSIS — N400 Enlarged prostate without lower urinary tract symptoms: Secondary | ICD-10-CM | POA: Diagnosis not present

## 2018-08-16 DIAGNOSIS — I10 Essential (primary) hypertension: Secondary | ICD-10-CM | POA: Diagnosis not present

## 2018-08-16 DIAGNOSIS — I2510 Atherosclerotic heart disease of native coronary artery without angina pectoris: Secondary | ICD-10-CM | POA: Diagnosis not present

## 2018-08-16 DIAGNOSIS — R69 Illness, unspecified: Secondary | ICD-10-CM | POA: Diagnosis not present

## 2018-08-16 DIAGNOSIS — I4891 Unspecified atrial fibrillation: Secondary | ICD-10-CM | POA: Diagnosis not present

## 2018-08-16 DIAGNOSIS — G309 Alzheimer's disease, unspecified: Secondary | ICD-10-CM | POA: Diagnosis not present

## 2018-08-17 IMAGING — CT CT HEAD W/O CM
5 of 8 series · 17 of 47 positions shown, 18 images · non-contrast
Comparison: Head CT dated 07/29/2016. Cervical spine CT dated
01/21/2014.

CLINICAL DATA: Altered mental status.

EXAM:
CT HEAD WITHOUT CONTRAST
CT CERVICAL SPINE WITHOUT CONTRAST
TECHNIQUE: Multidetector CT imaging of the head and cervical spine was
performed following the standard protocol without intravenous
contrast. Multiplanar CT image reconstructions of the cervical spine
were also generated.

[Series 5: head without · axial · non-contrast · 0.46mm/px · z∈[-100,+80]mm · 3 of 37 slices shown, 4 images]
[im 1/37  brain]
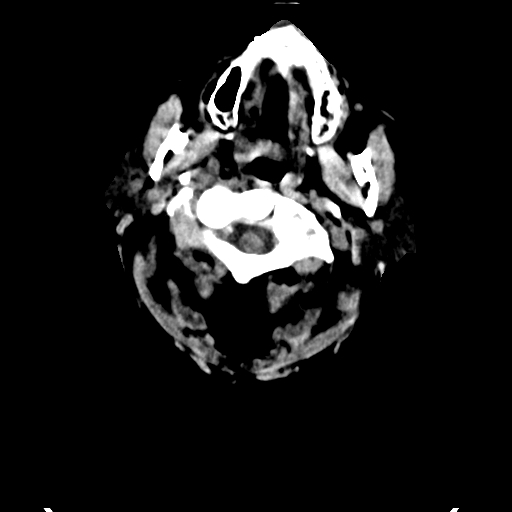
[im 1/37  bone]
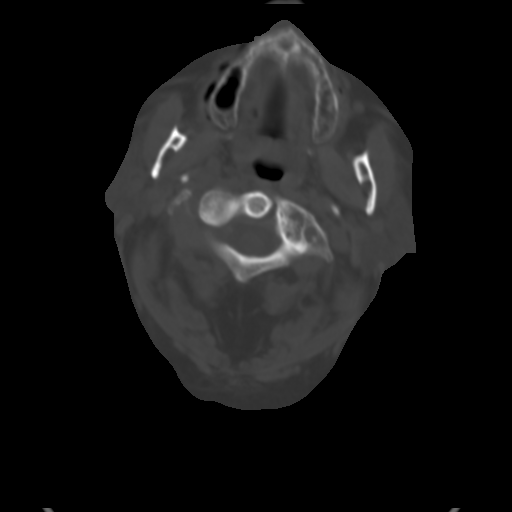
[im 19/37  brain]
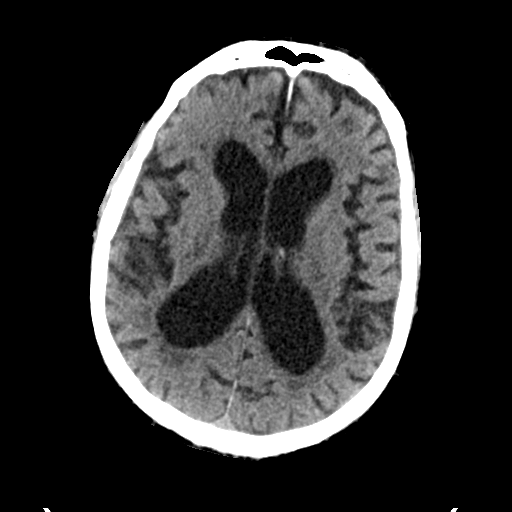
[im 37/37  brain]
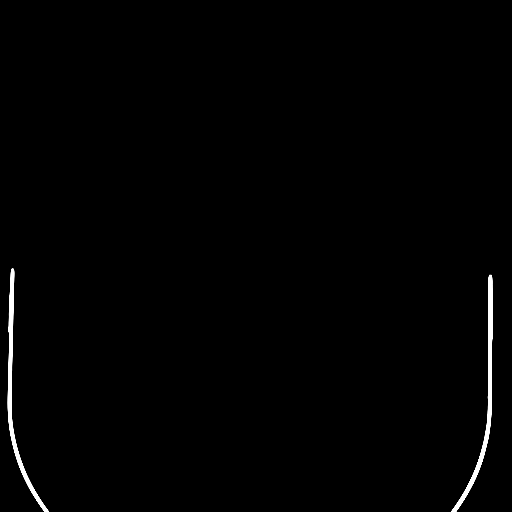

[Series 6: head bone · axial · 0.46mm/px · z∈[-78,+56]mm · 7 of 91 slices shown]
[im 12/91  bone]
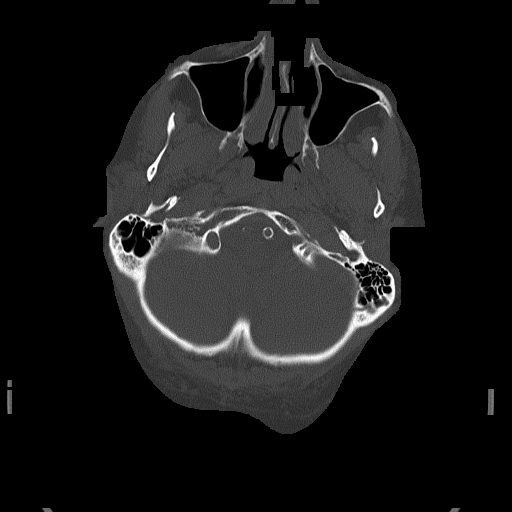
[im 23/91  bone]
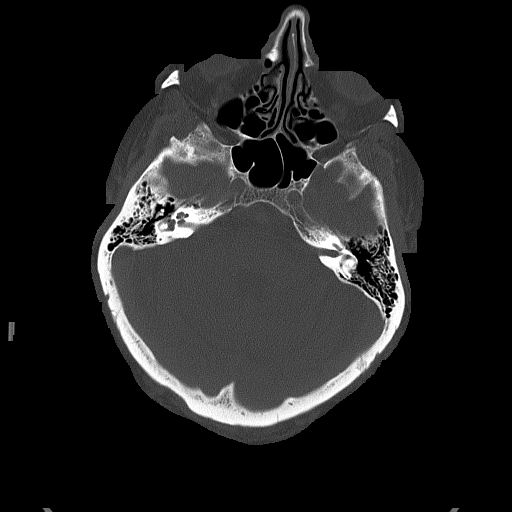
[im 34/91  bone]
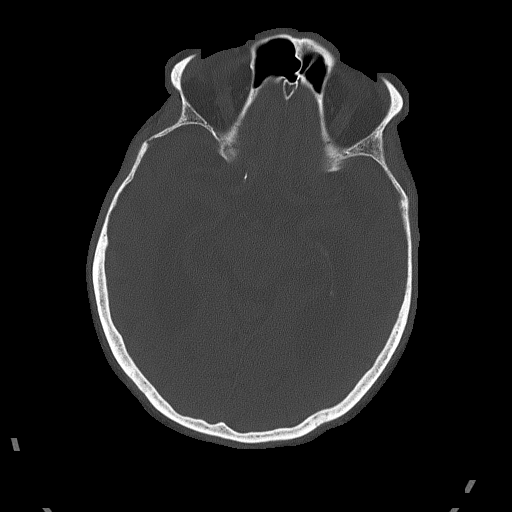
[im 46/91  bone]
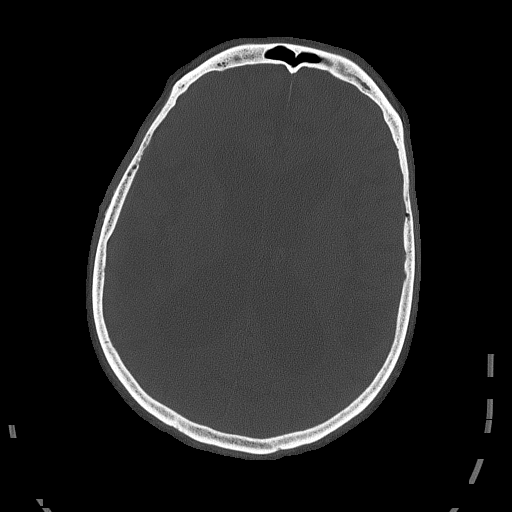
[im 57/91  bone]
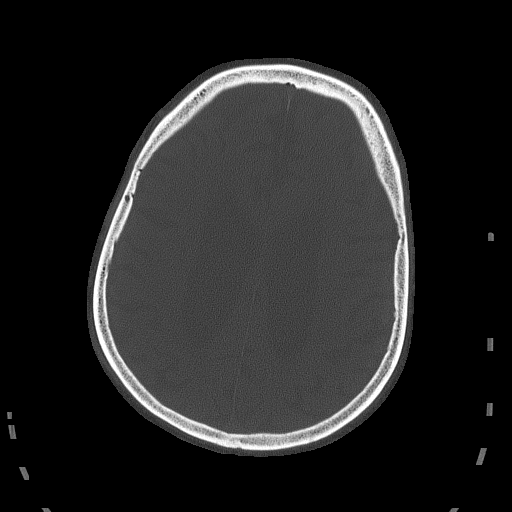
[im 68/91  bone]
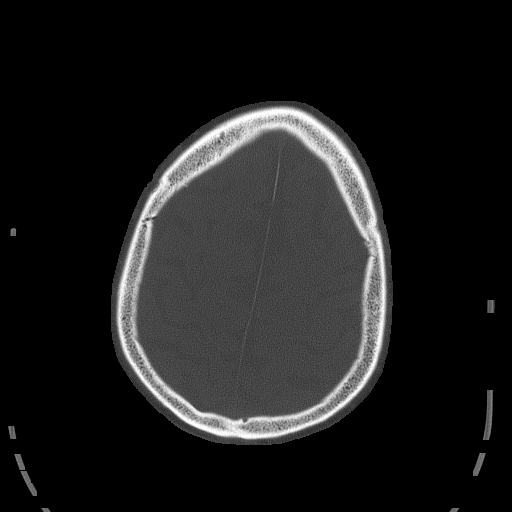
[im 79/91  bone]
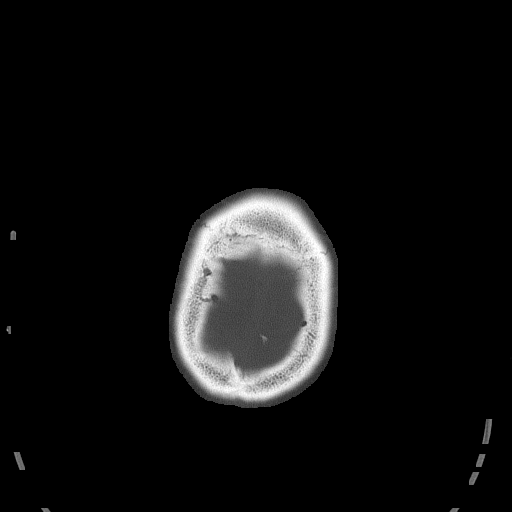

[Series 7: head without cor · coronal · non-contrast · 0.40mm/px · 2 of 79 slices shown]
[im 27/79  brain]
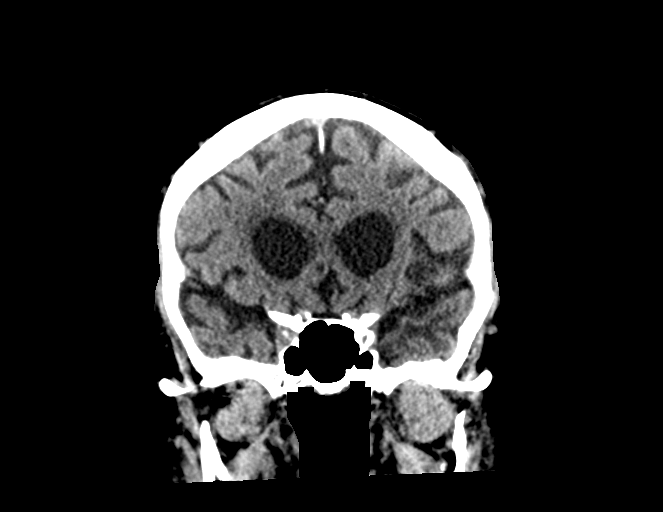
[im 53/79  brain]
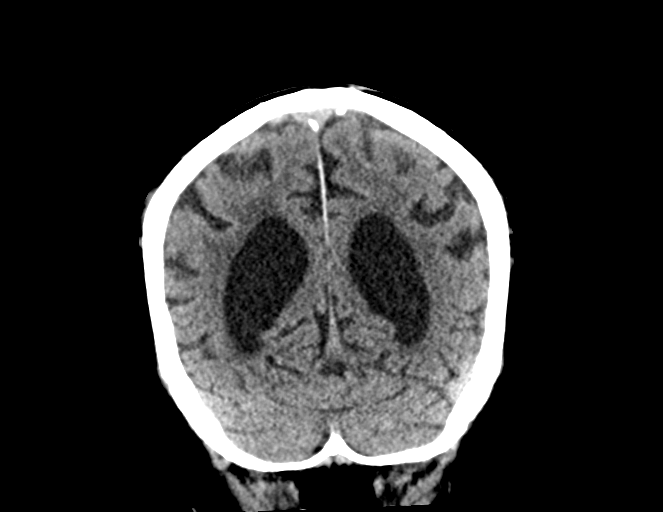

[Series 8: head without sag · sagittal · non-contrast · 0.36mm/px · 1 of 66 slices shown]
[im 33/66  brain]
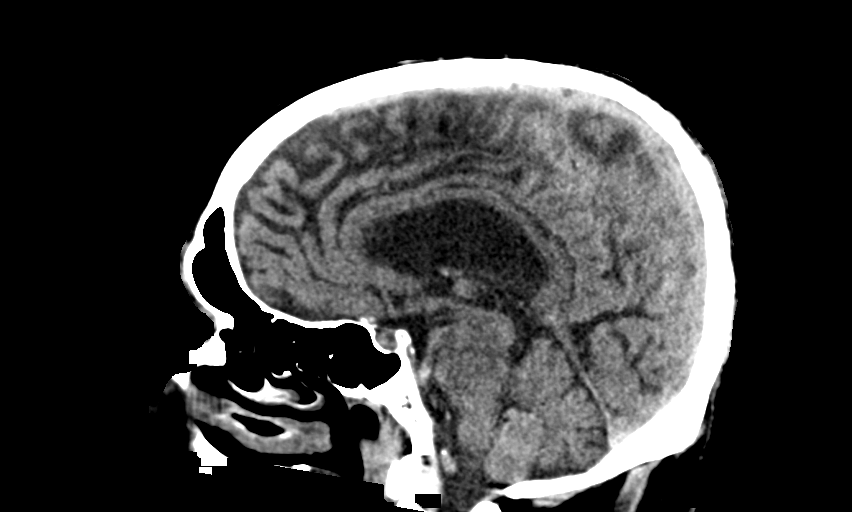

[Series 10: c_spine 2.0 st · axial · 0.42mm/px · z∈[-221,-153]mm · 4 of 91 slices shown]
[im 12/91  brain]
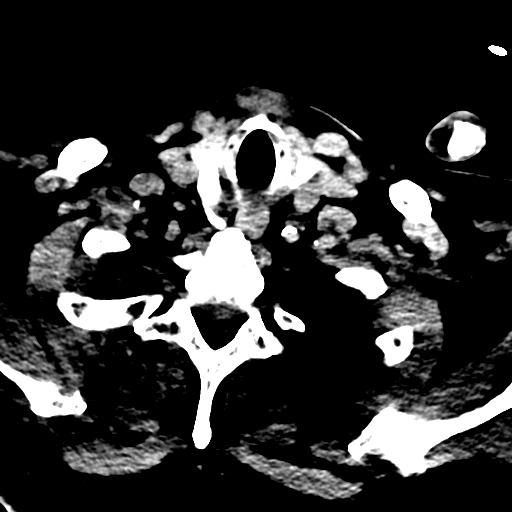
[im 23/91  brain]
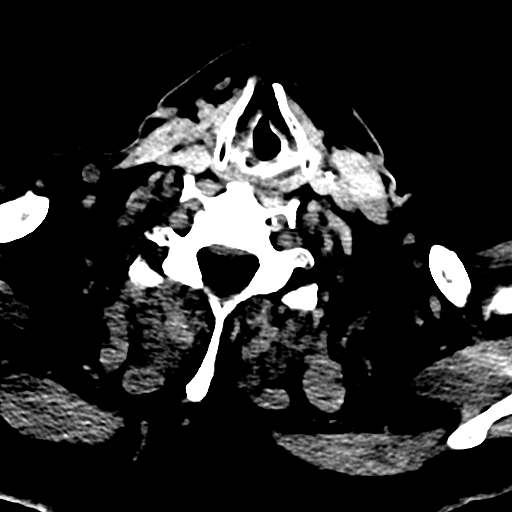
[im 34/91  brain]
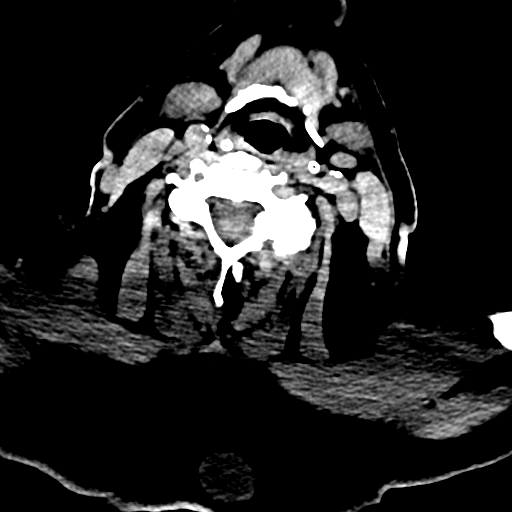
[im 46/91  brain]
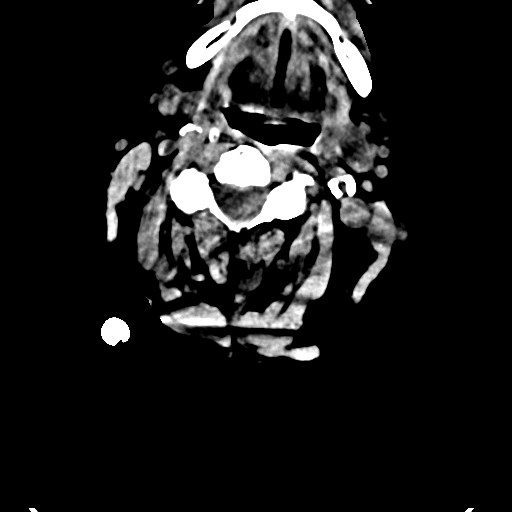

[17 of 47 positions shown; findings below may reference images not displayed]

FINDINGS: CT HEAD FINDINGS

Brain: Diffusely enlarged ventricles and subarachnoid spaces. Patchy
white matter low density in both cerebral hemispheres. No
intracranial hemorrhage, mass lesion or CT evidence of acute
infarction.

Vascular: No hyperdense vessel or unexpected calcification.

Skull: Normal. Negative for fracture or focal lesion.

Sinuses/Orbits: Status post bilateral cataract extraction.
Unremarkable paranasal sinuses.

Other: Right concha bullosa and deviation of the midportion of the
nasal septum to the left.

CT CERVICAL SPINE FINDINGS

Alignment: Normal.

Skull base and vertebrae: No acute fracture. No primary bone lesion
or focal pathologic process.

Soft tissues and spinal canal: No prevertebral fluid or swelling. No
visible canal hematoma.

Disc levels:  Multilevel degenerative changes.

Upper chest: Clear lung apices.

Other: Dense bilateral carotid artery calcifications.
IMPRESSION: 1. No skull fracture or intracranial hemorrhage.
2. No cervical spine fracture or subluxation.
3. Stable moderate diffuse cerebral atrophy and minimal chronic
small vessel white matter ischemic changes in both cerebral
hemispheres.
4. Multilevel cervical spine degenerative changes.
5. Dense bilateral carotid artery atheromatous calcifications.

## 2018-08-17 IMAGING — CR DG HIP (WITH OR WITHOUT PELVIS) 2-3V*R*
3 series · 3 of 3 positions shown · non-contrast
Comparison: None.

CLINICAL DATA: Right hip pain following a fall.

EXAM:
DG HIP (WITH OR WITHOUT PELVIS) 2-3V RIGHT

[hip ap]
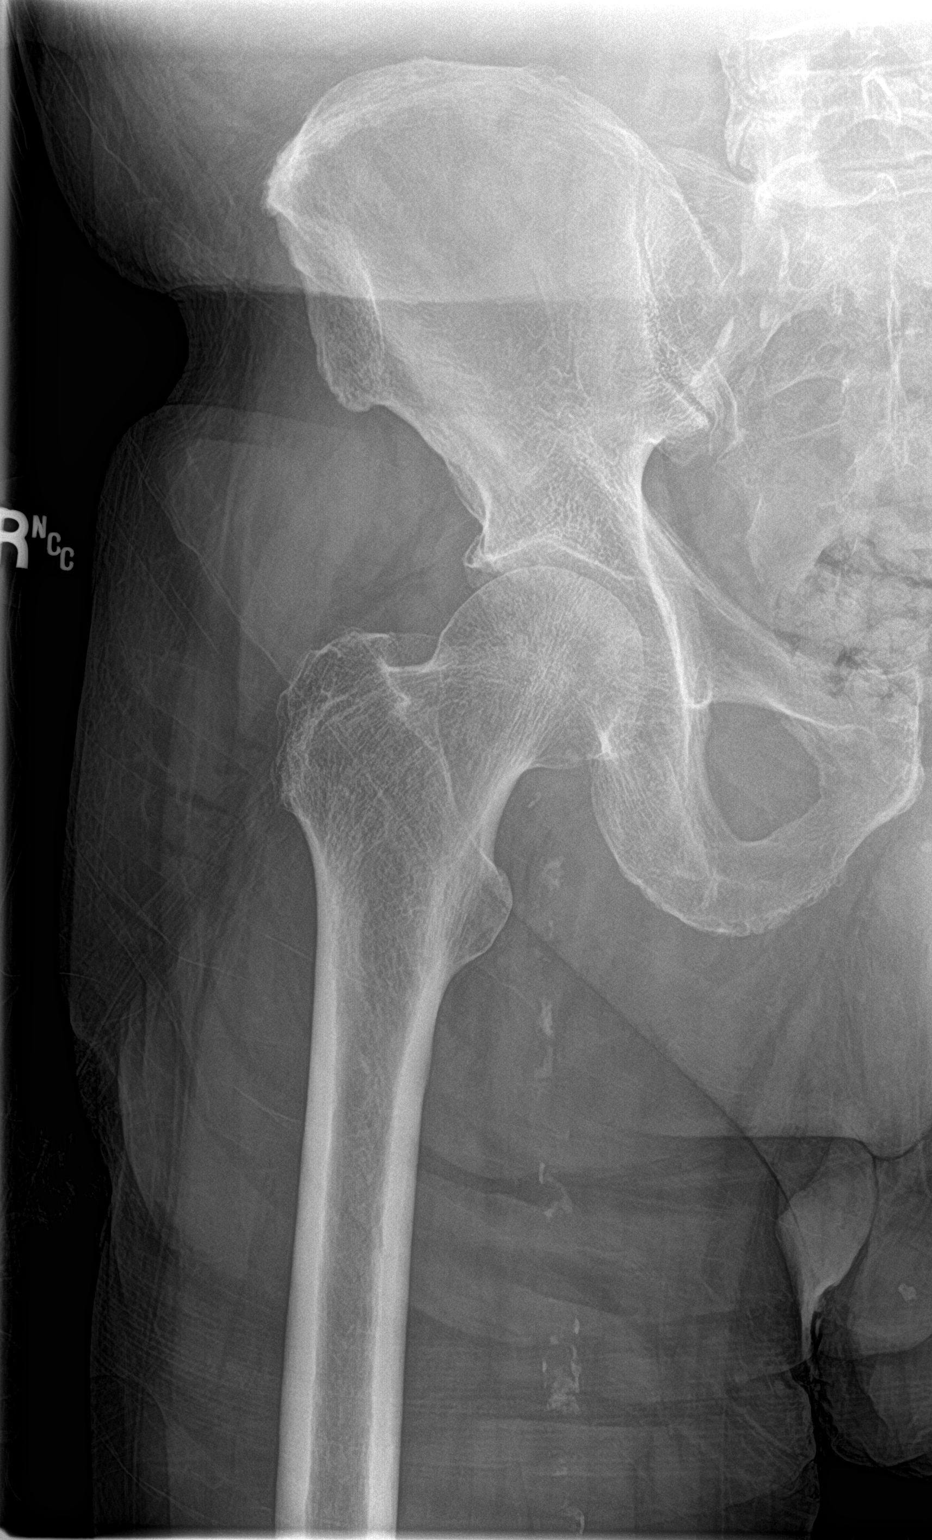

[hip lat]
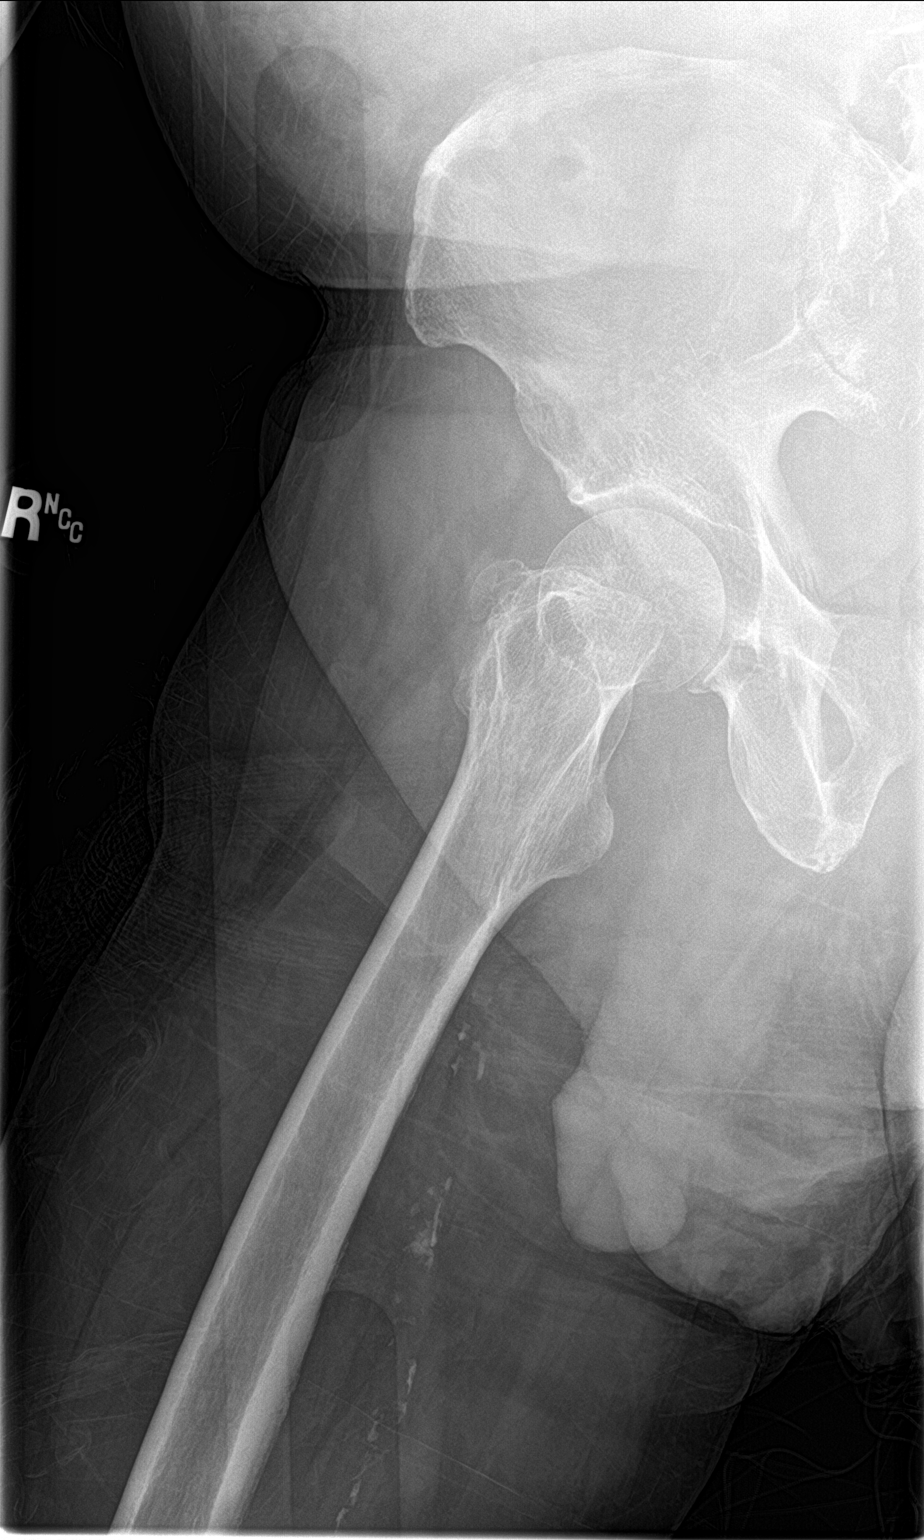

[pelvis ap]
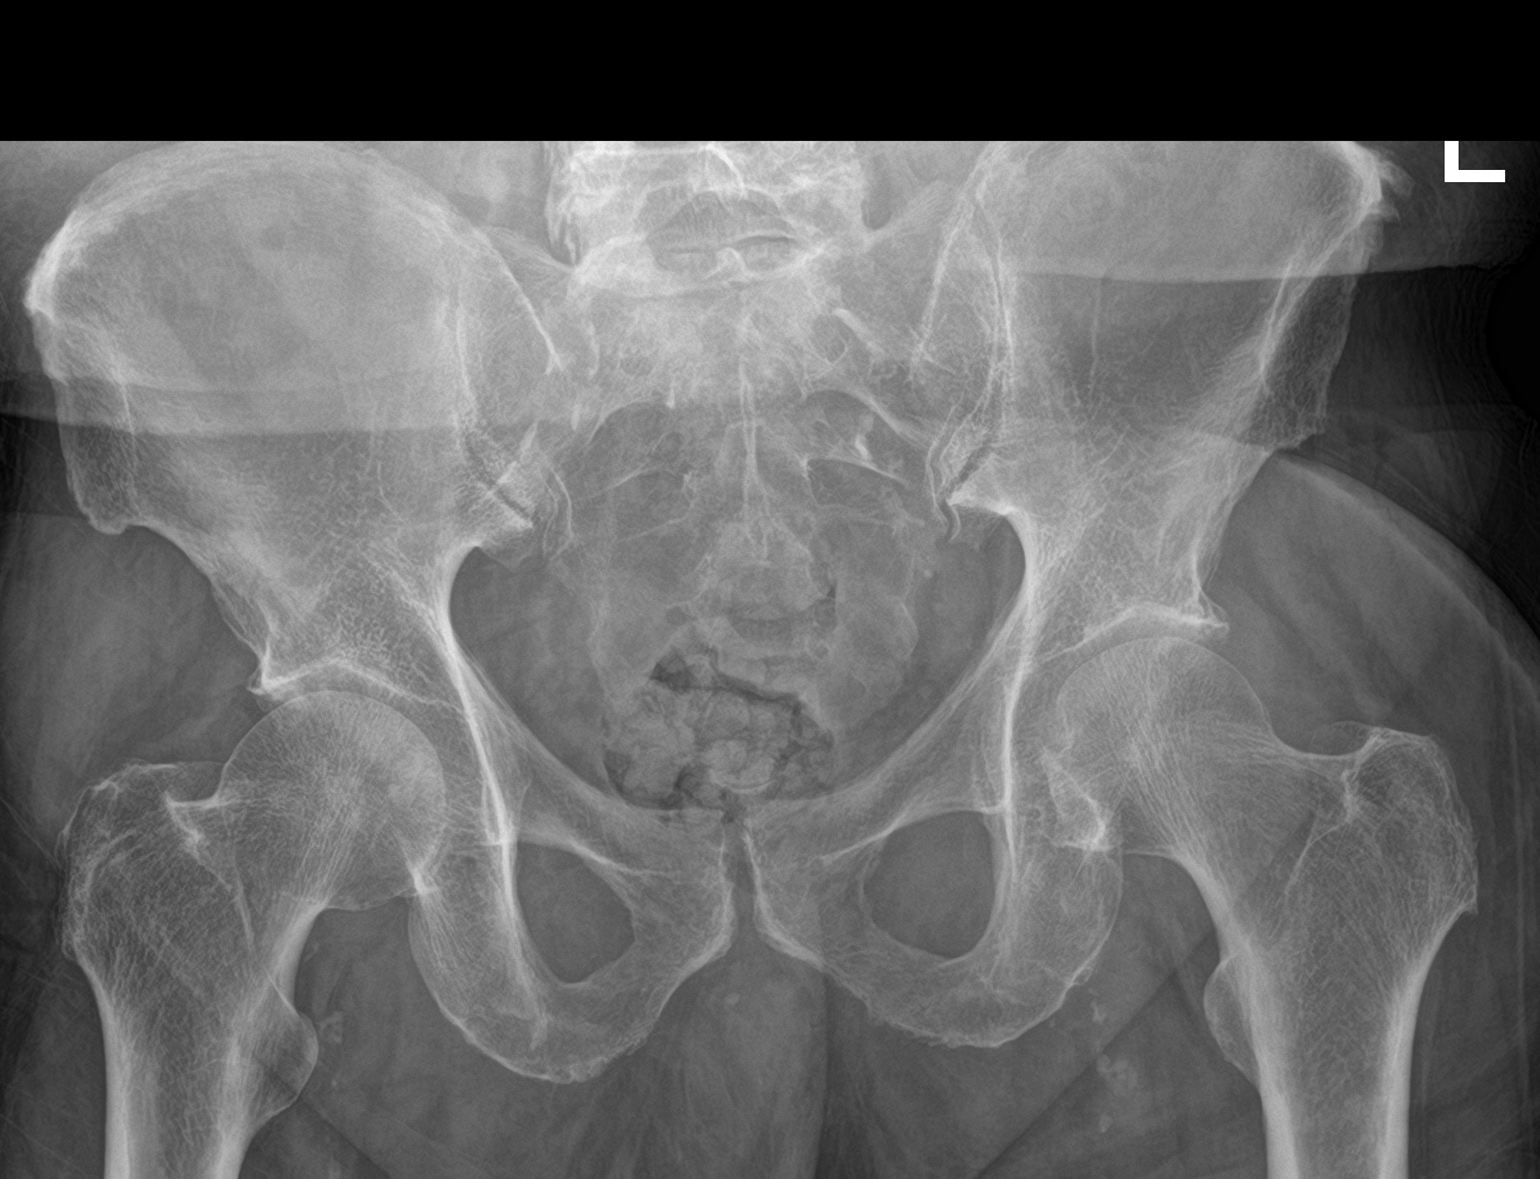

[3 of 3 positions shown; findings below may reference images not displayed]

FINDINGS: Diffuse osteopenia. No fracture or dislocation seen. Lower thoracic
spine degenerative changes. Atheromatous arterial calcifications.
IMPRESSION: No fracture or dislocation.

## 2018-08-17 IMAGING — CR DG CHEST 2V
2 series · 2 of 2 positions shown · non-contrast
Comparison: 01/21/2014 chest radiograph.

CLINICAL DATA: Fall

EXAM:
CHEST  2 VIEW

[chest lat]
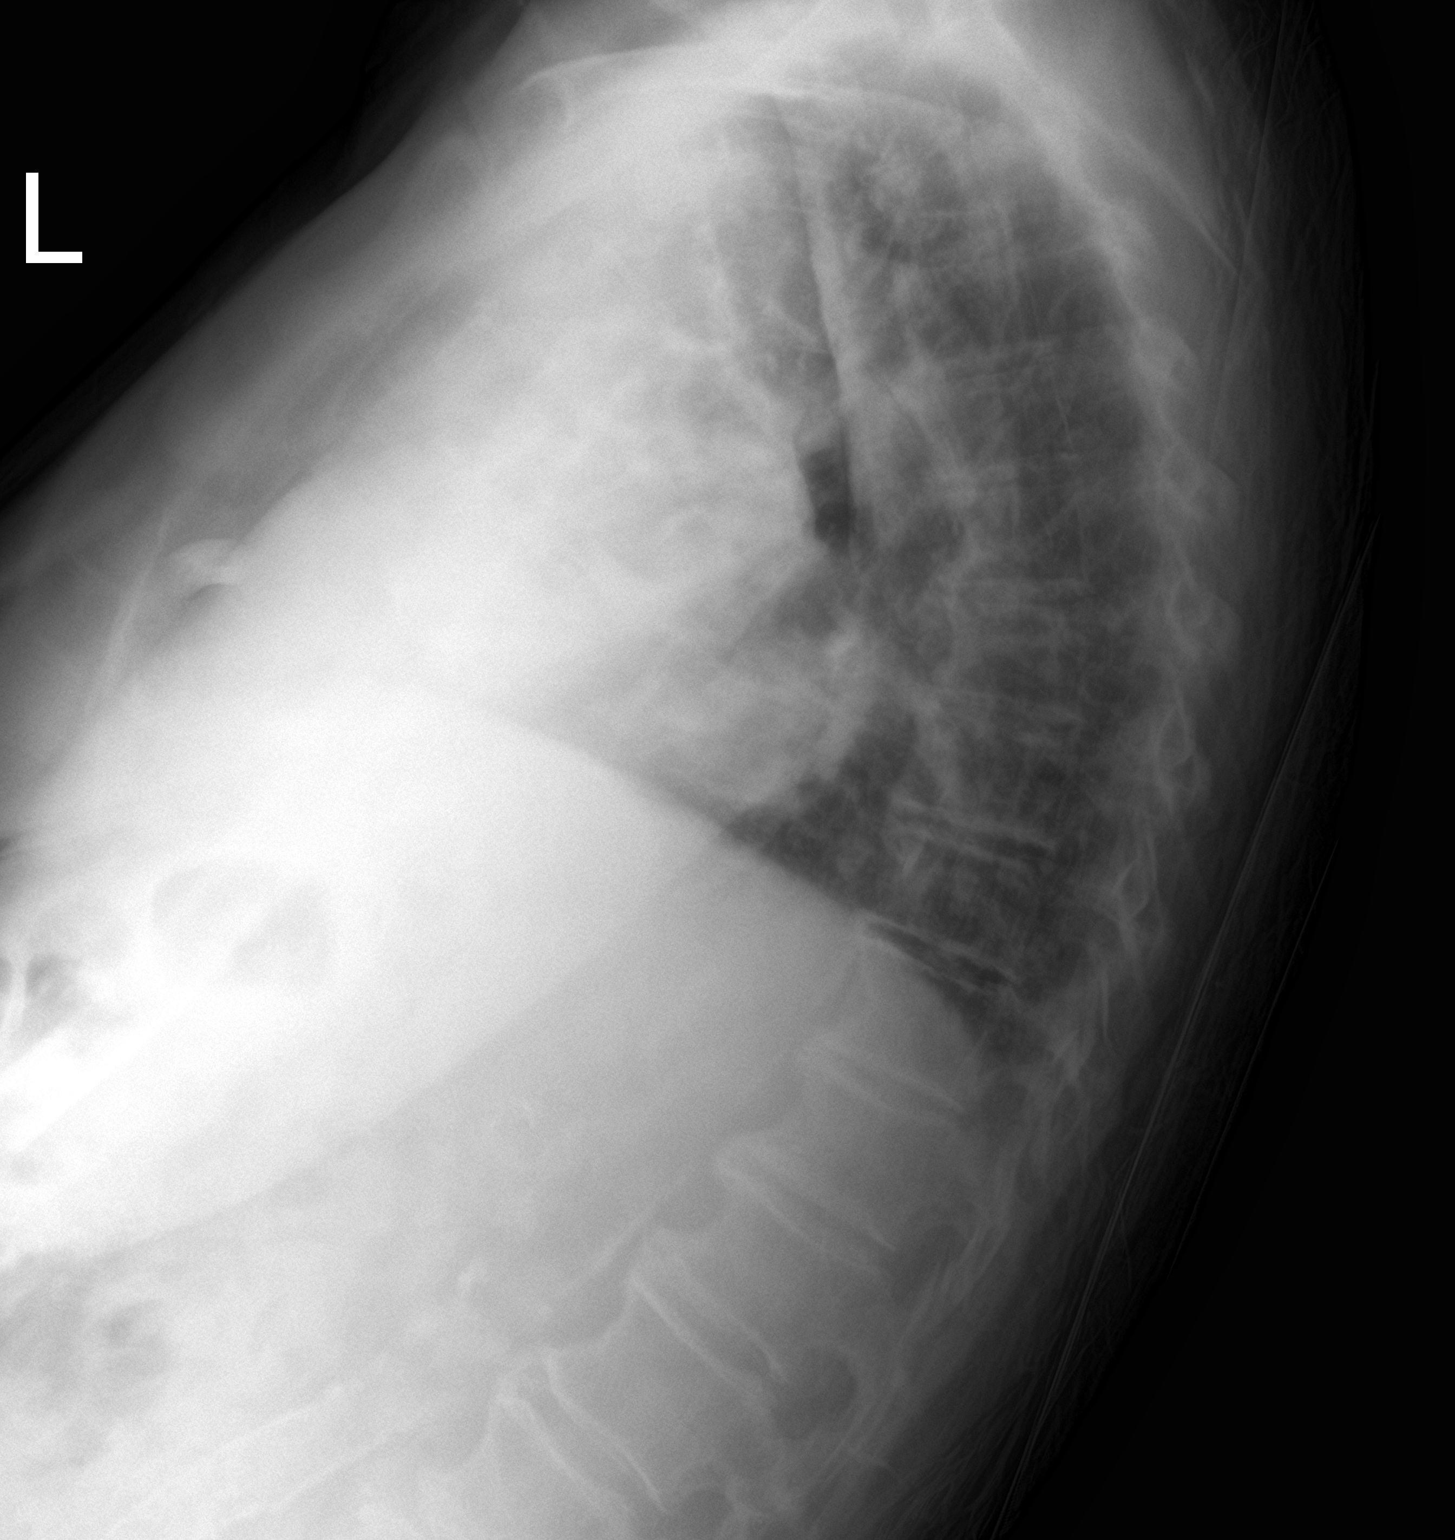

[chest ap]
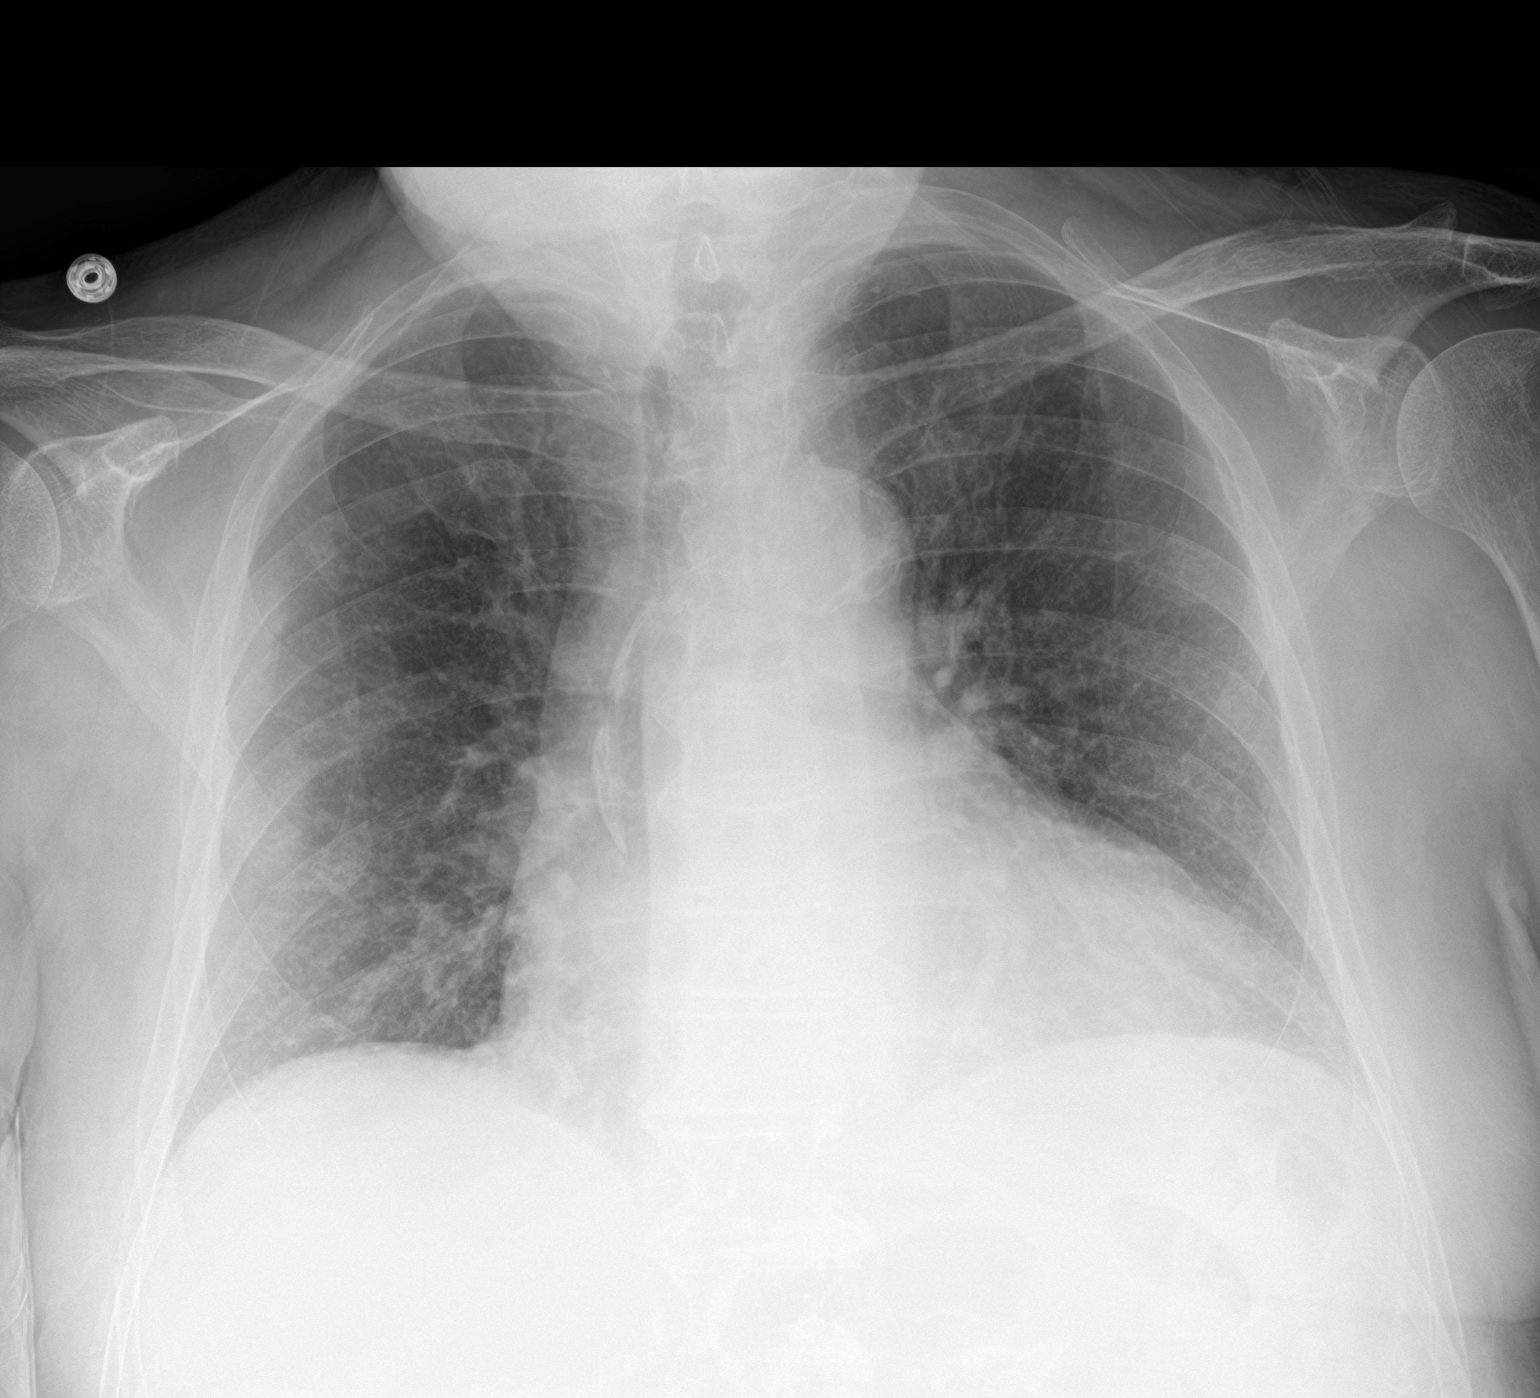

[2 of 2 positions shown; findings below may reference images not displayed]

FINDINGS: Stable cardiomediastinal silhouette with mild cardiomegaly and
aortic atherosclerosis. No pneumothorax. No pleural effusion. Lungs
appear clear, with no acute consolidative airspace disease and no
pulmonary edema. No displaced fractures in the visualized chest.
IMPRESSION: Stable mild cardiomegaly without pulmonary edema. No active
pulmonary disease.

## 2018-08-19 DIAGNOSIS — I10 Essential (primary) hypertension: Secondary | ICD-10-CM | POA: Diagnosis not present

## 2018-08-19 DIAGNOSIS — I2510 Atherosclerotic heart disease of native coronary artery without angina pectoris: Secondary | ICD-10-CM | POA: Diagnosis not present

## 2018-08-19 DIAGNOSIS — N400 Enlarged prostate without lower urinary tract symptoms: Secondary | ICD-10-CM | POA: Diagnosis not present

## 2018-08-19 DIAGNOSIS — R69 Illness, unspecified: Secondary | ICD-10-CM | POA: Diagnosis not present

## 2018-08-19 DIAGNOSIS — G309 Alzheimer's disease, unspecified: Secondary | ICD-10-CM | POA: Diagnosis not present

## 2018-08-19 DIAGNOSIS — I4891 Unspecified atrial fibrillation: Secondary | ICD-10-CM | POA: Diagnosis not present

## 2018-08-20 DIAGNOSIS — R69 Illness, unspecified: Secondary | ICD-10-CM | POA: Diagnosis not present

## 2018-08-20 DIAGNOSIS — I2510 Atherosclerotic heart disease of native coronary artery without angina pectoris: Secondary | ICD-10-CM | POA: Diagnosis not present

## 2018-08-20 DIAGNOSIS — I10 Essential (primary) hypertension: Secondary | ICD-10-CM | POA: Diagnosis not present

## 2018-08-20 DIAGNOSIS — N400 Enlarged prostate without lower urinary tract symptoms: Secondary | ICD-10-CM | POA: Diagnosis not present

## 2018-08-20 DIAGNOSIS — I4891 Unspecified atrial fibrillation: Secondary | ICD-10-CM | POA: Diagnosis not present

## 2018-08-20 DIAGNOSIS — G309 Alzheimer's disease, unspecified: Secondary | ICD-10-CM | POA: Diagnosis not present

## 2018-08-21 DIAGNOSIS — R69 Illness, unspecified: Secondary | ICD-10-CM | POA: Diagnosis not present

## 2018-08-21 DIAGNOSIS — I4891 Unspecified atrial fibrillation: Secondary | ICD-10-CM | POA: Diagnosis not present

## 2018-08-21 DIAGNOSIS — G309 Alzheimer's disease, unspecified: Secondary | ICD-10-CM | POA: Diagnosis not present

## 2018-08-21 DIAGNOSIS — N400 Enlarged prostate without lower urinary tract symptoms: Secondary | ICD-10-CM | POA: Diagnosis not present

## 2018-08-21 DIAGNOSIS — I10 Essential (primary) hypertension: Secondary | ICD-10-CM | POA: Diagnosis not present

## 2018-08-21 DIAGNOSIS — I2510 Atherosclerotic heart disease of native coronary artery without angina pectoris: Secondary | ICD-10-CM | POA: Diagnosis not present

## 2018-08-28 DIAGNOSIS — N400 Enlarged prostate without lower urinary tract symptoms: Secondary | ICD-10-CM | POA: Diagnosis not present

## 2018-08-28 DIAGNOSIS — G309 Alzheimer's disease, unspecified: Secondary | ICD-10-CM | POA: Diagnosis not present

## 2018-08-28 DIAGNOSIS — I10 Essential (primary) hypertension: Secondary | ICD-10-CM | POA: Diagnosis not present

## 2018-08-28 DIAGNOSIS — R69 Illness, unspecified: Secondary | ICD-10-CM | POA: Diagnosis not present

## 2018-08-28 DIAGNOSIS — I2510 Atherosclerotic heart disease of native coronary artery without angina pectoris: Secondary | ICD-10-CM | POA: Diagnosis not present

## 2018-08-28 DIAGNOSIS — I4891 Unspecified atrial fibrillation: Secondary | ICD-10-CM | POA: Diagnosis not present

## 2018-09-03 DIAGNOSIS — N400 Enlarged prostate without lower urinary tract symptoms: Secondary | ICD-10-CM | POA: Diagnosis not present

## 2018-09-03 DIAGNOSIS — R69 Illness, unspecified: Secondary | ICD-10-CM | POA: Diagnosis not present

## 2018-09-03 DIAGNOSIS — I4891 Unspecified atrial fibrillation: Secondary | ICD-10-CM | POA: Diagnosis not present

## 2018-09-03 DIAGNOSIS — G309 Alzheimer's disease, unspecified: Secondary | ICD-10-CM | POA: Diagnosis not present

## 2018-09-03 DIAGNOSIS — I10 Essential (primary) hypertension: Secondary | ICD-10-CM | POA: Diagnosis not present

## 2018-09-03 DIAGNOSIS — I2510 Atherosclerotic heart disease of native coronary artery without angina pectoris: Secondary | ICD-10-CM | POA: Diagnosis not present

## 2018-09-10 DIAGNOSIS — L03039 Cellulitis of unspecified toe: Secondary | ICD-10-CM | POA: Diagnosis not present

## 2018-09-10 DIAGNOSIS — I739 Peripheral vascular disease, unspecified: Secondary | ICD-10-CM | POA: Diagnosis not present

## 2018-09-10 DIAGNOSIS — B351 Tinea unguium: Secondary | ICD-10-CM | POA: Diagnosis not present

## 2018-09-20 DIAGNOSIS — G3109 Other frontotemporal dementia: Secondary | ICD-10-CM | POA: Diagnosis not present

## 2018-09-20 DIAGNOSIS — R69 Illness, unspecified: Secondary | ICD-10-CM | POA: Diagnosis not present

## 2018-09-20 DIAGNOSIS — I10 Essential (primary) hypertension: Secondary | ICD-10-CM | POA: Diagnosis not present

## 2018-09-20 DIAGNOSIS — Z79899 Other long term (current) drug therapy: Secondary | ICD-10-CM | POA: Diagnosis not present

## 2018-09-24 DIAGNOSIS — R05 Cough: Secondary | ICD-10-CM | POA: Diagnosis not present

## 2018-09-27 DIAGNOSIS — R1312 Dysphagia, oropharyngeal phase: Secondary | ICD-10-CM | POA: Diagnosis not present

## 2018-09-27 DIAGNOSIS — G309 Alzheimer's disease, unspecified: Secondary | ICD-10-CM | POA: Diagnosis not present

## 2018-09-27 DIAGNOSIS — I10 Essential (primary) hypertension: Secondary | ICD-10-CM | POA: Diagnosis not present

## 2018-09-27 DIAGNOSIS — R69 Illness, unspecified: Secondary | ICD-10-CM | POA: Diagnosis not present

## 2018-09-27 DIAGNOSIS — I4891 Unspecified atrial fibrillation: Secondary | ICD-10-CM | POA: Diagnosis not present

## 2018-09-27 DIAGNOSIS — I2510 Atherosclerotic heart disease of native coronary artery without angina pectoris: Secondary | ICD-10-CM | POA: Diagnosis not present

## 2018-09-27 DIAGNOSIS — N400 Enlarged prostate without lower urinary tract symptoms: Secondary | ICD-10-CM | POA: Diagnosis not present

## 2018-10-01 DIAGNOSIS — R1312 Dysphagia, oropharyngeal phase: Secondary | ICD-10-CM | POA: Diagnosis not present

## 2018-10-01 DIAGNOSIS — N400 Enlarged prostate without lower urinary tract symptoms: Secondary | ICD-10-CM | POA: Diagnosis not present

## 2018-10-01 DIAGNOSIS — I10 Essential (primary) hypertension: Secondary | ICD-10-CM | POA: Diagnosis not present

## 2018-10-01 DIAGNOSIS — I4891 Unspecified atrial fibrillation: Secondary | ICD-10-CM | POA: Diagnosis not present

## 2018-10-01 DIAGNOSIS — R05 Cough: Secondary | ICD-10-CM | POA: Diagnosis not present

## 2018-10-01 DIAGNOSIS — I2510 Atherosclerotic heart disease of native coronary artery without angina pectoris: Secondary | ICD-10-CM | POA: Diagnosis not present

## 2018-10-01 DIAGNOSIS — Z79899 Other long term (current) drug therapy: Secondary | ICD-10-CM | POA: Diagnosis not present

## 2018-10-01 DIAGNOSIS — G309 Alzheimer's disease, unspecified: Secondary | ICD-10-CM | POA: Diagnosis not present

## 2018-10-01 DIAGNOSIS — R69 Illness, unspecified: Secondary | ICD-10-CM | POA: Diagnosis not present

## 2018-10-04 DIAGNOSIS — I2510 Atherosclerotic heart disease of native coronary artery without angina pectoris: Secondary | ICD-10-CM | POA: Diagnosis not present

## 2018-10-04 DIAGNOSIS — R69 Illness, unspecified: Secondary | ICD-10-CM | POA: Diagnosis not present

## 2018-10-04 DIAGNOSIS — N400 Enlarged prostate without lower urinary tract symptoms: Secondary | ICD-10-CM | POA: Diagnosis not present

## 2018-10-04 DIAGNOSIS — R1312 Dysphagia, oropharyngeal phase: Secondary | ICD-10-CM | POA: Diagnosis not present

## 2018-10-04 DIAGNOSIS — I10 Essential (primary) hypertension: Secondary | ICD-10-CM | POA: Diagnosis not present

## 2018-10-04 DIAGNOSIS — I4891 Unspecified atrial fibrillation: Secondary | ICD-10-CM | POA: Diagnosis not present

## 2018-10-04 DIAGNOSIS — G309 Alzheimer's disease, unspecified: Secondary | ICD-10-CM | POA: Diagnosis not present

## 2018-10-07 DIAGNOSIS — G309 Alzheimer's disease, unspecified: Secondary | ICD-10-CM | POA: Diagnosis not present

## 2018-10-07 DIAGNOSIS — R69 Illness, unspecified: Secondary | ICD-10-CM | POA: Diagnosis not present

## 2018-10-07 DIAGNOSIS — I2510 Atherosclerotic heart disease of native coronary artery without angina pectoris: Secondary | ICD-10-CM | POA: Diagnosis not present

## 2018-10-07 DIAGNOSIS — N400 Enlarged prostate without lower urinary tract symptoms: Secondary | ICD-10-CM | POA: Diagnosis not present

## 2018-10-07 DIAGNOSIS — R1312 Dysphagia, oropharyngeal phase: Secondary | ICD-10-CM | POA: Diagnosis not present

## 2018-10-07 DIAGNOSIS — I4891 Unspecified atrial fibrillation: Secondary | ICD-10-CM | POA: Diagnosis not present

## 2018-10-07 DIAGNOSIS — I10 Essential (primary) hypertension: Secondary | ICD-10-CM | POA: Diagnosis not present

## 2018-10-10 DIAGNOSIS — R69 Illness, unspecified: Secondary | ICD-10-CM | POA: Diagnosis not present

## 2018-10-10 DIAGNOSIS — N400 Enlarged prostate without lower urinary tract symptoms: Secondary | ICD-10-CM | POA: Diagnosis not present

## 2018-10-10 DIAGNOSIS — I10 Essential (primary) hypertension: Secondary | ICD-10-CM | POA: Diagnosis not present

## 2018-10-10 DIAGNOSIS — G309 Alzheimer's disease, unspecified: Secondary | ICD-10-CM | POA: Diagnosis not present

## 2018-10-10 DIAGNOSIS — I4891 Unspecified atrial fibrillation: Secondary | ICD-10-CM | POA: Diagnosis not present

## 2018-10-10 DIAGNOSIS — R1312 Dysphagia, oropharyngeal phase: Secondary | ICD-10-CM | POA: Diagnosis not present

## 2018-10-10 DIAGNOSIS — I2510 Atherosclerotic heart disease of native coronary artery without angina pectoris: Secondary | ICD-10-CM | POA: Diagnosis not present

## 2018-10-15 DIAGNOSIS — Z0000 Encounter for general adult medical examination without abnormal findings: Secondary | ICD-10-CM | POA: Diagnosis not present

## 2018-10-15 DIAGNOSIS — Z23 Encounter for immunization: Secondary | ICD-10-CM | POA: Diagnosis not present

## 2018-10-16 DIAGNOSIS — I10 Essential (primary) hypertension: Secondary | ICD-10-CM | POA: Diagnosis not present

## 2018-10-16 DIAGNOSIS — R69 Illness, unspecified: Secondary | ICD-10-CM | POA: Diagnosis not present

## 2018-10-16 DIAGNOSIS — N400 Enlarged prostate without lower urinary tract symptoms: Secondary | ICD-10-CM | POA: Diagnosis not present

## 2018-10-16 DIAGNOSIS — I2510 Atherosclerotic heart disease of native coronary artery without angina pectoris: Secondary | ICD-10-CM | POA: Diagnosis not present

## 2018-10-16 DIAGNOSIS — G309 Alzheimer's disease, unspecified: Secondary | ICD-10-CM | POA: Diagnosis not present

## 2018-10-16 DIAGNOSIS — R1312 Dysphagia, oropharyngeal phase: Secondary | ICD-10-CM | POA: Diagnosis not present

## 2018-10-16 DIAGNOSIS — I4891 Unspecified atrial fibrillation: Secondary | ICD-10-CM | POA: Diagnosis not present

## 2018-10-29 DIAGNOSIS — G301 Alzheimer's disease with late onset: Secondary | ICD-10-CM | POA: Diagnosis not present

## 2018-11-05 DIAGNOSIS — Z79899 Other long term (current) drug therapy: Secondary | ICD-10-CM | POA: Diagnosis not present

## 2018-11-05 DIAGNOSIS — Z993 Dependence on wheelchair: Secondary | ICD-10-CM | POA: Diagnosis not present

## 2018-11-05 DIAGNOSIS — G301 Alzheimer's disease with late onset: Secondary | ICD-10-CM | POA: Diagnosis not present

## 2018-11-12 DIAGNOSIS — R1319 Other dysphagia: Secondary | ICD-10-CM | POA: Diagnosis not present

## 2018-11-12 DIAGNOSIS — Z79899 Other long term (current) drug therapy: Secondary | ICD-10-CM | POA: Diagnosis not present

## 2018-11-12 DIAGNOSIS — G301 Alzheimer's disease with late onset: Secondary | ICD-10-CM | POA: Diagnosis not present

## 2018-11-12 DIAGNOSIS — Z993 Dependence on wheelchair: Secondary | ICD-10-CM | POA: Diagnosis not present

## 2018-11-13 DIAGNOSIS — R69 Illness, unspecified: Secondary | ICD-10-CM | POA: Diagnosis not present

## 2018-11-13 DIAGNOSIS — R1312 Dysphagia, oropharyngeal phase: Secondary | ICD-10-CM | POA: Diagnosis not present

## 2018-11-13 DIAGNOSIS — G309 Alzheimer's disease, unspecified: Secondary | ICD-10-CM | POA: Diagnosis not present

## 2018-11-20 DIAGNOSIS — R69 Illness, unspecified: Secondary | ICD-10-CM | POA: Diagnosis not present

## 2018-11-20 DIAGNOSIS — R1312 Dysphagia, oropharyngeal phase: Secondary | ICD-10-CM | POA: Diagnosis not present

## 2018-11-20 DIAGNOSIS — G309 Alzheimer's disease, unspecified: Secondary | ICD-10-CM | POA: Diagnosis not present

## 2018-11-22 DIAGNOSIS — G309 Alzheimer's disease, unspecified: Secondary | ICD-10-CM | POA: Diagnosis not present

## 2018-11-22 DIAGNOSIS — R1312 Dysphagia, oropharyngeal phase: Secondary | ICD-10-CM | POA: Diagnosis not present

## 2018-11-22 DIAGNOSIS — R69 Illness, unspecified: Secondary | ICD-10-CM | POA: Diagnosis not present

## 2018-11-25 DIAGNOSIS — G309 Alzheimer's disease, unspecified: Secondary | ICD-10-CM | POA: Diagnosis not present

## 2018-11-25 DIAGNOSIS — R69 Illness, unspecified: Secondary | ICD-10-CM | POA: Diagnosis not present

## 2018-11-25 DIAGNOSIS — R1312 Dysphagia, oropharyngeal phase: Secondary | ICD-10-CM | POA: Diagnosis not present

## 2018-12-09 DIAGNOSIS — G309 Alzheimer's disease, unspecified: Secondary | ICD-10-CM | POA: Diagnosis not present

## 2018-12-09 DIAGNOSIS — R1312 Dysphagia, oropharyngeal phase: Secondary | ICD-10-CM | POA: Diagnosis not present

## 2018-12-09 DIAGNOSIS — R69 Illness, unspecified: Secondary | ICD-10-CM | POA: Diagnosis not present

## 2018-12-10 DIAGNOSIS — G301 Alzheimer's disease with late onset: Secondary | ICD-10-CM | POA: Diagnosis not present

## 2018-12-10 DIAGNOSIS — Z79899 Other long term (current) drug therapy: Secondary | ICD-10-CM | POA: Diagnosis not present

## 2018-12-10 DIAGNOSIS — R2681 Unsteadiness on feet: Secondary | ICD-10-CM | POA: Diagnosis not present

## 2018-12-10 DIAGNOSIS — R600 Localized edema: Secondary | ICD-10-CM | POA: Diagnosis not present

## 2018-12-12 DIAGNOSIS — B351 Tinea unguium: Secondary | ICD-10-CM | POA: Diagnosis not present

## 2018-12-12 DIAGNOSIS — I739 Peripheral vascular disease, unspecified: Secondary | ICD-10-CM | POA: Diagnosis not present

## 2018-12-13 DIAGNOSIS — G309 Alzheimer's disease, unspecified: Secondary | ICD-10-CM | POA: Diagnosis not present

## 2018-12-13 DIAGNOSIS — R1312 Dysphagia, oropharyngeal phase: Secondary | ICD-10-CM | POA: Diagnosis not present

## 2018-12-13 DIAGNOSIS — R69 Illness, unspecified: Secondary | ICD-10-CM | POA: Diagnosis not present

## 2018-12-18 DIAGNOSIS — R69 Illness, unspecified: Secondary | ICD-10-CM | POA: Diagnosis not present

## 2018-12-18 DIAGNOSIS — R1312 Dysphagia, oropharyngeal phase: Secondary | ICD-10-CM | POA: Diagnosis not present

## 2018-12-18 DIAGNOSIS — G309 Alzheimer's disease, unspecified: Secondary | ICD-10-CM | POA: Diagnosis not present

## 2018-12-20 DIAGNOSIS — Z79899 Other long term (current) drug therapy: Secondary | ICD-10-CM | POA: Diagnosis not present

## 2018-12-25 DIAGNOSIS — R69 Illness, unspecified: Secondary | ICD-10-CM | POA: Diagnosis not present

## 2018-12-25 DIAGNOSIS — R1312 Dysphagia, oropharyngeal phase: Secondary | ICD-10-CM | POA: Diagnosis not present

## 2018-12-25 DIAGNOSIS — G309 Alzheimer's disease, unspecified: Secondary | ICD-10-CM | POA: Diagnosis not present

## 2019-01-01 DIAGNOSIS — R69 Illness, unspecified: Secondary | ICD-10-CM | POA: Diagnosis not present

## 2019-01-01 DIAGNOSIS — R1312 Dysphagia, oropharyngeal phase: Secondary | ICD-10-CM | POA: Diagnosis not present

## 2019-01-01 DIAGNOSIS — G309 Alzheimer's disease, unspecified: Secondary | ICD-10-CM | POA: Diagnosis not present

## 2019-01-07 DIAGNOSIS — Z79899 Other long term (current) drug therapy: Secondary | ICD-10-CM | POA: Diagnosis not present

## 2019-01-07 DIAGNOSIS — R609 Edema, unspecified: Secondary | ICD-10-CM | POA: Diagnosis not present

## 2019-01-07 DIAGNOSIS — Z993 Dependence on wheelchair: Secondary | ICD-10-CM | POA: Diagnosis not present

## 2019-01-08 DIAGNOSIS — R69 Illness, unspecified: Secondary | ICD-10-CM | POA: Diagnosis not present

## 2019-01-08 DIAGNOSIS — G309 Alzheimer's disease, unspecified: Secondary | ICD-10-CM | POA: Diagnosis not present

## 2019-01-08 DIAGNOSIS — R1312 Dysphagia, oropharyngeal phase: Secondary | ICD-10-CM | POA: Diagnosis not present

## 2019-01-15 ENCOUNTER — Telehealth: Payer: Self-pay | Admitting: Cardiovascular Disease

## 2019-01-15 NOTE — Telephone Encounter (Signed)
New Message           Patient's wife called today because she got a reminder call to make an appt for her husband. He is in a memory care unit and she will not be making future appts. For her patient.

## 2019-01-15 NOTE — Telephone Encounter (Signed)
Called patient wife back, she just wanted to make Dr.Kelly aware that her husband will not be coming back to see him as he is in a memory care unit, and she is not able to get him to [facility]. He is being followed by doctors there that make house calls. She thanks Dr.Kelly for his care. Will route to MD to make him aware,.

## 2019-01-22 NOTE — Telephone Encounter (Signed)
Thank you for letting me know

## 2019-01-24 DIAGNOSIS — Z79899 Other long term (current) drug therapy: Secondary | ICD-10-CM | POA: Diagnosis not present

## 2019-02-11 DIAGNOSIS — Z79899 Other long term (current) drug therapy: Secondary | ICD-10-CM | POA: Diagnosis not present

## 2019-02-11 DIAGNOSIS — G301 Alzheimer's disease with late onset: Secondary | ICD-10-CM | POA: Diagnosis not present

## 2019-02-24 DIAGNOSIS — I739 Peripheral vascular disease, unspecified: Secondary | ICD-10-CM | POA: Diagnosis not present

## 2019-02-24 DIAGNOSIS — B351 Tinea unguium: Secondary | ICD-10-CM | POA: Diagnosis not present

## 2019-04-21 DIAGNOSIS — R062 Wheezing: Secondary | ICD-10-CM | POA: Diagnosis not present

## 2019-04-22 DIAGNOSIS — J189 Pneumonia, unspecified organism: Secondary | ICD-10-CM | POA: Diagnosis not present

## 2019-04-22 DIAGNOSIS — R05 Cough: Secondary | ICD-10-CM | POA: Diagnosis not present

## 2019-04-22 DIAGNOSIS — I2510 Atherosclerotic heart disease of native coronary artery without angina pectoris: Secondary | ICD-10-CM | POA: Diagnosis not present

## 2019-04-22 DIAGNOSIS — Z993 Dependence on wheelchair: Secondary | ICD-10-CM | POA: Diagnosis not present

## 2019-04-22 DIAGNOSIS — G301 Alzheimer's disease with late onset: Secondary | ICD-10-CM | POA: Diagnosis not present

## 2019-04-29 DIAGNOSIS — J189 Pneumonia, unspecified organism: Secondary | ICD-10-CM | POA: Diagnosis not present

## 2019-04-30 DIAGNOSIS — Z09 Encounter for follow-up examination after completed treatment for conditions other than malignant neoplasm: Secondary | ICD-10-CM | POA: Diagnosis not present

## 2019-04-30 DIAGNOSIS — J189 Pneumonia, unspecified organism: Secondary | ICD-10-CM | POA: Diagnosis not present

## 2019-05-01 DIAGNOSIS — B351 Tinea unguium: Secondary | ICD-10-CM | POA: Diagnosis not present

## 2019-05-01 DIAGNOSIS — I739 Peripheral vascular disease, unspecified: Secondary | ICD-10-CM | POA: Diagnosis not present

## 2019-05-06 DIAGNOSIS — G301 Alzheimer's disease with late onset: Secondary | ICD-10-CM | POA: Diagnosis not present

## 2019-06-03 DIAGNOSIS — I119 Hypertensive heart disease without heart failure: Secondary | ICD-10-CM | POA: Diagnosis not present

## 2019-06-03 DIAGNOSIS — I2510 Atherosclerotic heart disease of native coronary artery without angina pectoris: Secondary | ICD-10-CM | POA: Diagnosis not present

## 2019-06-03 DIAGNOSIS — I739 Peripheral vascular disease, unspecified: Secondary | ICD-10-CM | POA: Diagnosis not present

## 2019-06-03 DIAGNOSIS — I4891 Unspecified atrial fibrillation: Secondary | ICD-10-CM | POA: Diagnosis not present

## 2019-06-04 DIAGNOSIS — Z03818 Encounter for observation for suspected exposure to other biological agents ruled out: Secondary | ICD-10-CM | POA: Diagnosis not present

## 2019-06-04 DIAGNOSIS — R05 Cough: Secondary | ICD-10-CM | POA: Diagnosis not present

## 2019-06-17 DIAGNOSIS — Z1159 Encounter for screening for other viral diseases: Secondary | ICD-10-CM | POA: Diagnosis not present

## 2019-06-17 DIAGNOSIS — I4891 Unspecified atrial fibrillation: Secondary | ICD-10-CM | POA: Diagnosis not present

## 2019-06-17 DIAGNOSIS — I10 Essential (primary) hypertension: Secondary | ICD-10-CM | POA: Diagnosis not present

## 2019-06-17 DIAGNOSIS — I2510 Atherosclerotic heart disease of native coronary artery without angina pectoris: Secondary | ICD-10-CM | POA: Diagnosis not present

## 2019-06-17 DIAGNOSIS — R509 Fever, unspecified: Secondary | ICD-10-CM | POA: Diagnosis not present

## 2019-07-03 DIAGNOSIS — I739 Peripheral vascular disease, unspecified: Secondary | ICD-10-CM | POA: Diagnosis not present

## 2019-07-03 DIAGNOSIS — B351 Tinea unguium: Secondary | ICD-10-CM | POA: Diagnosis not present

## 2019-07-23 DIAGNOSIS — Z79899 Other long term (current) drug therapy: Secondary | ICD-10-CM | POA: Diagnosis not present

## 2019-07-23 DIAGNOSIS — N390 Urinary tract infection, site not specified: Secondary | ICD-10-CM | POA: Diagnosis not present

## 2019-07-29 DIAGNOSIS — G301 Alzheimer's disease with late onset: Secondary | ICD-10-CM | POA: Diagnosis not present

## 2019-07-29 DIAGNOSIS — I2510 Atherosclerotic heart disease of native coronary artery without angina pectoris: Secondary | ICD-10-CM | POA: Diagnosis not present

## 2019-07-29 DIAGNOSIS — I4891 Unspecified atrial fibrillation: Secondary | ICD-10-CM | POA: Diagnosis not present

## 2019-07-29 DIAGNOSIS — Z79899 Other long term (current) drug therapy: Secondary | ICD-10-CM | POA: Diagnosis not present

## 2019-07-29 DIAGNOSIS — I119 Hypertensive heart disease without heart failure: Secondary | ICD-10-CM | POA: Diagnosis not present

## 2019-08-07 DIAGNOSIS — R05 Cough: Secondary | ICD-10-CM | POA: Diagnosis not present

## 2019-08-07 DIAGNOSIS — T17918A Gastric contents in respiratory tract, part unspecified causing other injury, initial encounter: Secondary | ICD-10-CM | POA: Diagnosis not present

## 2019-08-07 DIAGNOSIS — R1112 Projectile vomiting: Secondary | ICD-10-CM | POA: Diagnosis not present

## 2019-08-08 DIAGNOSIS — R05 Cough: Secondary | ICD-10-CM | POA: Diagnosis not present

## 2019-08-08 DIAGNOSIS — G301 Alzheimer's disease with late onset: Secondary | ICD-10-CM | POA: Diagnosis not present

## 2019-08-08 DIAGNOSIS — Z79899 Other long term (current) drug therapy: Secondary | ICD-10-CM | POA: Diagnosis not present

## 2019-08-22 DIAGNOSIS — N390 Urinary tract infection, site not specified: Secondary | ICD-10-CM | POA: Diagnosis not present

## 2019-08-22 DIAGNOSIS — Z79899 Other long term (current) drug therapy: Secondary | ICD-10-CM | POA: Diagnosis not present

## 2019-08-22 DIAGNOSIS — Z20828 Contact with and (suspected) exposure to other viral communicable diseases: Secondary | ICD-10-CM | POA: Diagnosis not present

## 2019-09-12 DIAGNOSIS — N390 Urinary tract infection, site not specified: Secondary | ICD-10-CM | POA: Diagnosis not present

## 2019-09-12 DIAGNOSIS — Z79899 Other long term (current) drug therapy: Secondary | ICD-10-CM | POA: Diagnosis not present

## 2019-09-18 DIAGNOSIS — G3109 Other frontotemporal dementia: Secondary | ICD-10-CM | POA: Diagnosis not present

## 2019-09-18 DIAGNOSIS — Z79899 Other long term (current) drug therapy: Secondary | ICD-10-CM | POA: Diagnosis not present

## 2019-09-18 DIAGNOSIS — R05 Cough: Secondary | ICD-10-CM | POA: Diagnosis not present

## 2019-09-18 DIAGNOSIS — I739 Peripheral vascular disease, unspecified: Secondary | ICD-10-CM | POA: Diagnosis not present

## 2019-09-18 DIAGNOSIS — G301 Alzheimer's disease with late onset: Secondary | ICD-10-CM | POA: Diagnosis not present

## 2019-09-18 DIAGNOSIS — I119 Hypertensive heart disease without heart failure: Secondary | ICD-10-CM | POA: Diagnosis not present

## 2019-09-19 DIAGNOSIS — R269 Unspecified abnormalities of gait and mobility: Secondary | ICD-10-CM | POA: Diagnosis not present

## 2019-09-19 DIAGNOSIS — I10 Essential (primary) hypertension: Secondary | ICD-10-CM | POA: Diagnosis not present

## 2019-09-19 DIAGNOSIS — R609 Edema, unspecified: Secondary | ICD-10-CM | POA: Diagnosis not present

## 2019-09-19 DIAGNOSIS — Z79899 Other long term (current) drug therapy: Secondary | ICD-10-CM | POA: Diagnosis not present

## 2019-09-22 DIAGNOSIS — R05 Cough: Secondary | ICD-10-CM | POA: Diagnosis not present

## 2019-09-22 DIAGNOSIS — Z20828 Contact with and (suspected) exposure to other viral communicable diseases: Secondary | ICD-10-CM | POA: Diagnosis not present

## 2019-09-25 DIAGNOSIS — I4891 Unspecified atrial fibrillation: Secondary | ICD-10-CM | POA: Diagnosis not present

## 2019-09-25 DIAGNOSIS — S065X0S Traumatic subdural hemorrhage without loss of consciousness, sequela: Secondary | ICD-10-CM | POA: Diagnosis not present

## 2019-09-25 DIAGNOSIS — I2510 Atherosclerotic heart disease of native coronary artery without angina pectoris: Secondary | ICD-10-CM | POA: Diagnosis not present

## 2019-09-25 DIAGNOSIS — G301 Alzheimer's disease with late onset: Secondary | ICD-10-CM | POA: Diagnosis not present

## 2019-09-25 DIAGNOSIS — J690 Pneumonitis due to inhalation of food and vomit: Secondary | ICD-10-CM | POA: Diagnosis not present

## 2019-09-27 DIAGNOSIS — L602 Onychogryphosis: Secondary | ICD-10-CM | POA: Diagnosis not present

## 2019-09-27 DIAGNOSIS — I739 Peripheral vascular disease, unspecified: Secondary | ICD-10-CM | POA: Diagnosis not present

## 2019-09-28 DIAGNOSIS — R0603 Acute respiratory distress: Secondary | ICD-10-CM | POA: Diagnosis not present

## 2019-09-28 DIAGNOSIS — J9601 Acute respiratory failure with hypoxia: Secondary | ICD-10-CM | POA: Diagnosis not present

## 2019-09-28 DIAGNOSIS — R0602 Shortness of breath: Secondary | ICD-10-CM | POA: Diagnosis not present

## 2019-09-28 DIAGNOSIS — R4189 Other symptoms and signs involving cognitive functions and awareness: Secondary | ICD-10-CM | POA: Diagnosis not present

## 2019-09-28 DIAGNOSIS — E872 Acidosis: Secondary | ICD-10-CM | POA: Diagnosis not present

## 2019-09-28 DIAGNOSIS — J690 Pneumonitis due to inhalation of food and vomit: Secondary | ICD-10-CM | POA: Diagnosis not present

## 2019-09-28 DIAGNOSIS — R1312 Dysphagia, oropharyngeal phase: Secondary | ICD-10-CM | POA: Diagnosis not present

## 2019-09-28 DIAGNOSIS — Z20828 Contact with and (suspected) exposure to other viral communicable diseases: Secondary | ICD-10-CM | POA: Diagnosis not present

## 2019-09-28 DIAGNOSIS — R0689 Other abnormalities of breathing: Secondary | ICD-10-CM | POA: Diagnosis not present

## 2019-09-29 DIAGNOSIS — E872 Acidosis: Secondary | ICD-10-CM | POA: Diagnosis not present

## 2019-09-29 DIAGNOSIS — N289 Disorder of kidney and ureter, unspecified: Secondary | ICD-10-CM | POA: Diagnosis not present

## 2019-09-29 DIAGNOSIS — I10 Essential (primary) hypertension: Secondary | ICD-10-CM | POA: Diagnosis not present

## 2019-09-29 DIAGNOSIS — G9340 Encephalopathy, unspecified: Secondary | ICD-10-CM | POA: Diagnosis not present

## 2019-09-29 DIAGNOSIS — G9341 Metabolic encephalopathy: Secondary | ICD-10-CM | POA: Diagnosis not present

## 2019-09-29 DIAGNOSIS — G309 Alzheimer's disease, unspecified: Secondary | ICD-10-CM | POA: Diagnosis not present

## 2019-09-29 DIAGNOSIS — R69 Illness, unspecified: Secondary | ICD-10-CM | POA: Diagnosis not present

## 2019-09-29 DIAGNOSIS — Z66 Do not resuscitate: Secondary | ICD-10-CM | POA: Diagnosis not present

## 2019-09-29 DIAGNOSIS — R4182 Altered mental status, unspecified: Secondary | ICD-10-CM | POA: Diagnosis not present

## 2019-09-29 DIAGNOSIS — R0602 Shortness of breath: Secondary | ICD-10-CM | POA: Diagnosis not present

## 2019-09-29 DIAGNOSIS — R4189 Other symptoms and signs involving cognitive functions and awareness: Secondary | ICD-10-CM | POA: Diagnosis not present

## 2019-09-29 DIAGNOSIS — R0603 Acute respiratory distress: Secondary | ICD-10-CM | POA: Diagnosis not present

## 2019-09-29 DIAGNOSIS — Z20828 Contact with and (suspected) exposure to other viral communicable diseases: Secondary | ICD-10-CM | POA: Diagnosis not present

## 2019-09-29 DIAGNOSIS — E874 Mixed disorder of acid-base balance: Secondary | ICD-10-CM | POA: Diagnosis not present

## 2019-09-29 DIAGNOSIS — G301 Alzheimer's disease with late onset: Secondary | ICD-10-CM | POA: Diagnosis not present

## 2019-09-29 DIAGNOSIS — J690 Pneumonitis due to inhalation of food and vomit: Secondary | ICD-10-CM | POA: Diagnosis not present

## 2019-09-29 DIAGNOSIS — N179 Acute kidney failure, unspecified: Secondary | ICD-10-CM | POA: Diagnosis not present

## 2019-09-29 DIAGNOSIS — J9601 Acute respiratory failure with hypoxia: Secondary | ICD-10-CM | POA: Diagnosis not present

## 2019-09-29 DIAGNOSIS — R1312 Dysphagia, oropharyngeal phase: Secondary | ICD-10-CM | POA: Diagnosis not present

## 2019-09-29 DIAGNOSIS — N3000 Acute cystitis without hematuria: Secondary | ICD-10-CM | POA: Diagnosis not present

## 2019-09-29 DIAGNOSIS — R627 Adult failure to thrive: Secondary | ICD-10-CM | POA: Diagnosis not present

## 2019-09-30 DIAGNOSIS — R69 Illness, unspecified: Secondary | ICD-10-CM | POA: Diagnosis not present

## 2019-09-30 DIAGNOSIS — G309 Alzheimer's disease, unspecified: Secondary | ICD-10-CM | POA: Diagnosis not present

## 2019-09-30 DIAGNOSIS — I10 Essential (primary) hypertension: Secondary | ICD-10-CM | POA: Diagnosis not present

## 2019-09-30 DIAGNOSIS — G9340 Encephalopathy, unspecified: Secondary | ICD-10-CM | POA: Diagnosis not present

## 2019-09-30 DIAGNOSIS — N3000 Acute cystitis without hematuria: Secondary | ICD-10-CM | POA: Diagnosis not present

## 2019-10-01 DIAGNOSIS — R69 Illness, unspecified: Secondary | ICD-10-CM | POA: Diagnosis not present

## 2019-10-01 DIAGNOSIS — G309 Alzheimer's disease, unspecified: Secondary | ICD-10-CM | POA: Diagnosis not present

## 2019-10-01 DIAGNOSIS — I10 Essential (primary) hypertension: Secondary | ICD-10-CM | POA: Diagnosis not present

## 2019-10-01 DIAGNOSIS — G9340 Encephalopathy, unspecified: Secondary | ICD-10-CM | POA: Diagnosis not present

## 2019-10-03 DIAGNOSIS — I4891 Unspecified atrial fibrillation: Secondary | ICD-10-CM | POA: Diagnosis not present

## 2019-10-03 DIAGNOSIS — G9349 Other encephalopathy: Secondary | ICD-10-CM | POA: Diagnosis not present

## 2019-10-03 DIAGNOSIS — G309 Alzheimer's disease, unspecified: Secondary | ICD-10-CM | POA: Diagnosis not present

## 2019-10-03 DIAGNOSIS — I2510 Atherosclerotic heart disease of native coronary artery without angina pectoris: Secondary | ICD-10-CM | POA: Diagnosis not present

## 2019-10-03 DIAGNOSIS — I119 Hypertensive heart disease without heart failure: Secondary | ICD-10-CM | POA: Diagnosis not present

## 2019-10-03 DIAGNOSIS — J690 Pneumonitis due to inhalation of food and vomit: Secondary | ICD-10-CM | POA: Diagnosis not present

## 2019-10-03 DIAGNOSIS — Z87820 Personal history of traumatic brain injury: Secondary | ICD-10-CM | POA: Diagnosis not present

## 2019-10-03 DIAGNOSIS — Z87440 Personal history of urinary (tract) infections: Secondary | ICD-10-CM | POA: Diagnosis not present

## 2019-10-03 DIAGNOSIS — R69 Illness, unspecified: Secondary | ICD-10-CM | POA: Diagnosis not present

## 2019-10-03 DIAGNOSIS — R1312 Dysphagia, oropharyngeal phase: Secondary | ICD-10-CM | POA: Diagnosis not present

## 2019-10-06 DIAGNOSIS — I2510 Atherosclerotic heart disease of native coronary artery without angina pectoris: Secondary | ICD-10-CM | POA: Diagnosis not present

## 2019-10-06 DIAGNOSIS — Z87440 Personal history of urinary (tract) infections: Secondary | ICD-10-CM | POA: Diagnosis not present

## 2019-10-06 DIAGNOSIS — G309 Alzheimer's disease, unspecified: Secondary | ICD-10-CM | POA: Diagnosis not present

## 2019-10-06 DIAGNOSIS — I119 Hypertensive heart disease without heart failure: Secondary | ICD-10-CM | POA: Diagnosis not present

## 2019-10-06 DIAGNOSIS — R1312 Dysphagia, oropharyngeal phase: Secondary | ICD-10-CM | POA: Diagnosis not present

## 2019-10-06 DIAGNOSIS — J690 Pneumonitis due to inhalation of food and vomit: Secondary | ICD-10-CM | POA: Diagnosis not present

## 2019-10-06 DIAGNOSIS — I4891 Unspecified atrial fibrillation: Secondary | ICD-10-CM | POA: Diagnosis not present

## 2019-10-06 DIAGNOSIS — G9349 Other encephalopathy: Secondary | ICD-10-CM | POA: Diagnosis not present

## 2019-10-06 DIAGNOSIS — R69 Illness, unspecified: Secondary | ICD-10-CM | POA: Diagnosis not present

## 2019-10-06 DIAGNOSIS — Z87820 Personal history of traumatic brain injury: Secondary | ICD-10-CM | POA: Diagnosis not present

## 2019-10-07 DIAGNOSIS — G9349 Other encephalopathy: Secondary | ICD-10-CM | POA: Diagnosis not present

## 2019-10-07 DIAGNOSIS — I2510 Atherosclerotic heart disease of native coronary artery without angina pectoris: Secondary | ICD-10-CM | POA: Diagnosis not present

## 2019-10-07 DIAGNOSIS — G309 Alzheimer's disease, unspecified: Secondary | ICD-10-CM | POA: Diagnosis not present

## 2019-10-07 DIAGNOSIS — I119 Hypertensive heart disease without heart failure: Secondary | ICD-10-CM | POA: Diagnosis not present

## 2019-10-07 DIAGNOSIS — R1312 Dysphagia, oropharyngeal phase: Secondary | ICD-10-CM | POA: Diagnosis not present

## 2019-10-07 DIAGNOSIS — I4891 Unspecified atrial fibrillation: Secondary | ICD-10-CM | POA: Diagnosis not present

## 2019-10-07 DIAGNOSIS — Z87440 Personal history of urinary (tract) infections: Secondary | ICD-10-CM | POA: Diagnosis not present

## 2019-10-07 DIAGNOSIS — J690 Pneumonitis due to inhalation of food and vomit: Secondary | ICD-10-CM | POA: Diagnosis not present

## 2019-10-07 DIAGNOSIS — R69 Illness, unspecified: Secondary | ICD-10-CM | POA: Diagnosis not present

## 2019-10-07 DIAGNOSIS — Z87820 Personal history of traumatic brain injury: Secondary | ICD-10-CM | POA: Diagnosis not present

## 2019-10-20 DIAGNOSIS — L89159 Pressure ulcer of sacral region, unspecified stage: Secondary | ICD-10-CM | POA: Diagnosis not present

## 2019-10-20 DIAGNOSIS — I96 Gangrene, not elsewhere classified: Secondary | ICD-10-CM | POA: Diagnosis not present

## 2019-10-20 DIAGNOSIS — R0689 Other abnormalities of breathing: Secondary | ICD-10-CM | POA: Diagnosis not present

## 2019-10-20 DIAGNOSIS — I10 Essential (primary) hypertension: Secondary | ICD-10-CM | POA: Diagnosis not present

## 2019-10-20 DIAGNOSIS — L929 Granulomatous disorder of the skin and subcutaneous tissue, unspecified: Secondary | ICD-10-CM | POA: Diagnosis not present

## 2019-10-20 DIAGNOSIS — R1319 Other dysphagia: Secondary | ICD-10-CM | POA: Diagnosis not present

## 2019-10-20 DIAGNOSIS — R69 Illness, unspecified: Secondary | ICD-10-CM | POA: Diagnosis not present

## 2019-10-20 DIAGNOSIS — L89150 Pressure ulcer of sacral region, unstageable: Secondary | ICD-10-CM | POA: Diagnosis not present

## 2019-10-20 DIAGNOSIS — N400 Enlarged prostate without lower urinary tract symptoms: Secondary | ICD-10-CM | POA: Diagnosis not present

## 2019-10-20 DIAGNOSIS — I480 Paroxysmal atrial fibrillation: Secondary | ICD-10-CM | POA: Diagnosis not present

## 2019-10-20 DIAGNOSIS — G309 Alzheimer's disease, unspecified: Secondary | ICD-10-CM | POA: Diagnosis not present

## 2019-10-20 DIAGNOSIS — R269 Unspecified abnormalities of gait and mobility: Secondary | ICD-10-CM | POA: Diagnosis not present

## 2019-10-21 DIAGNOSIS — R918 Other nonspecific abnormal finding of lung field: Secondary | ICD-10-CM | POA: Diagnosis not present

## 2019-10-21 DIAGNOSIS — R0989 Other specified symptoms and signs involving the circulatory and respiratory systems: Secondary | ICD-10-CM | POA: Diagnosis not present

## 2019-10-22 DIAGNOSIS — L89150 Pressure ulcer of sacral region, unstageable: Secondary | ICD-10-CM | POA: Diagnosis not present

## 2019-10-22 DIAGNOSIS — R69 Illness, unspecified: Secondary | ICD-10-CM | POA: Diagnosis not present

## 2019-10-22 DIAGNOSIS — G309 Alzheimer's disease, unspecified: Secondary | ICD-10-CM | POA: Diagnosis not present

## 2019-10-23 DIAGNOSIS — R1319 Other dysphagia: Secondary | ICD-10-CM | POA: Diagnosis not present

## 2019-10-23 DIAGNOSIS — L89150 Pressure ulcer of sacral region, unstageable: Secondary | ICD-10-CM | POA: Diagnosis not present

## 2019-10-23 DIAGNOSIS — R69 Illness, unspecified: Secondary | ICD-10-CM | POA: Diagnosis not present

## 2019-10-23 DIAGNOSIS — R0689 Other abnormalities of breathing: Secondary | ICD-10-CM | POA: Diagnosis not present

## 2019-10-24 DIAGNOSIS — G309 Alzheimer's disease, unspecified: Secondary | ICD-10-CM | POA: Diagnosis not present

## 2019-10-24 DIAGNOSIS — R269 Unspecified abnormalities of gait and mobility: Secondary | ICD-10-CM | POA: Diagnosis not present

## 2019-10-24 DIAGNOSIS — L929 Granulomatous disorder of the skin and subcutaneous tissue, unspecified: Secondary | ICD-10-CM | POA: Diagnosis not present

## 2019-10-24 DIAGNOSIS — L89159 Pressure ulcer of sacral region, unspecified stage: Secondary | ICD-10-CM | POA: Diagnosis not present

## 2019-10-24 DIAGNOSIS — I96 Gangrene, not elsewhere classified: Secondary | ICD-10-CM | POA: Diagnosis not present

## 2019-10-24 DIAGNOSIS — R69 Illness, unspecified: Secondary | ICD-10-CM | POA: Diagnosis not present

## 2019-10-27 DIAGNOSIS — R269 Unspecified abnormalities of gait and mobility: Secondary | ICD-10-CM | POA: Diagnosis not present

## 2019-10-27 DIAGNOSIS — R69 Illness, unspecified: Secondary | ICD-10-CM | POA: Diagnosis not present

## 2019-10-27 DIAGNOSIS — L89159 Pressure ulcer of sacral region, unspecified stage: Secondary | ICD-10-CM | POA: Diagnosis not present

## 2019-10-27 DIAGNOSIS — L929 Granulomatous disorder of the skin and subcutaneous tissue, unspecified: Secondary | ICD-10-CM | POA: Diagnosis not present

## 2019-10-27 DIAGNOSIS — I96 Gangrene, not elsewhere classified: Secondary | ICD-10-CM | POA: Diagnosis not present

## 2019-10-27 DIAGNOSIS — G309 Alzheimer's disease, unspecified: Secondary | ICD-10-CM | POA: Diagnosis not present

## 2019-10-28 DIAGNOSIS — R627 Adult failure to thrive: Secondary | ICD-10-CM | POA: Diagnosis not present

## 2019-10-28 DIAGNOSIS — R0689 Other abnormalities of breathing: Secondary | ICD-10-CM | POA: Diagnosis not present

## 2019-10-28 DIAGNOSIS — L89150 Pressure ulcer of sacral region, unstageable: Secondary | ICD-10-CM | POA: Diagnosis not present

## 2019-10-28 DIAGNOSIS — I10 Essential (primary) hypertension: Secondary | ICD-10-CM | POA: Diagnosis not present

## 2019-10-29 DIAGNOSIS — L89159 Pressure ulcer of sacral region, unspecified stage: Secondary | ICD-10-CM | POA: Diagnosis not present

## 2019-10-29 DIAGNOSIS — R69 Illness, unspecified: Secondary | ICD-10-CM | POA: Diagnosis not present

## 2019-10-29 DIAGNOSIS — G309 Alzheimer's disease, unspecified: Secondary | ICD-10-CM | POA: Diagnosis not present

## 2019-10-29 DIAGNOSIS — L929 Granulomatous disorder of the skin and subcutaneous tissue, unspecified: Secondary | ICD-10-CM | POA: Diagnosis not present

## 2019-10-29 DIAGNOSIS — I96 Gangrene, not elsewhere classified: Secondary | ICD-10-CM | POA: Diagnosis not present

## 2019-10-29 DIAGNOSIS — R269 Unspecified abnormalities of gait and mobility: Secondary | ICD-10-CM | POA: Diagnosis not present

## 2019-10-31 DIAGNOSIS — R69 Illness, unspecified: Secondary | ICD-10-CM | POA: Diagnosis not present

## 2019-10-31 DIAGNOSIS — L89159 Pressure ulcer of sacral region, unspecified stage: Secondary | ICD-10-CM | POA: Diagnosis not present

## 2019-10-31 DIAGNOSIS — R269 Unspecified abnormalities of gait and mobility: Secondary | ICD-10-CM | POA: Diagnosis not present

## 2019-10-31 DIAGNOSIS — I96 Gangrene, not elsewhere classified: Secondary | ICD-10-CM | POA: Diagnosis not present

## 2019-10-31 DIAGNOSIS — L929 Granulomatous disorder of the skin and subcutaneous tissue, unspecified: Secondary | ICD-10-CM | POA: Diagnosis not present

## 2019-10-31 DIAGNOSIS — G309 Alzheimer's disease, unspecified: Secondary | ICD-10-CM | POA: Diagnosis not present

## 2019-11-04 DIAGNOSIS — L89159 Pressure ulcer of sacral region, unspecified stage: Secondary | ICD-10-CM | POA: Diagnosis not present

## 2019-11-04 DIAGNOSIS — L89150 Pressure ulcer of sacral region, unstageable: Secondary | ICD-10-CM | POA: Diagnosis not present

## 2019-11-04 DIAGNOSIS — R69 Illness, unspecified: Secondary | ICD-10-CM | POA: Diagnosis not present

## 2019-11-04 DIAGNOSIS — G309 Alzheimer's disease, unspecified: Secondary | ICD-10-CM | POA: Diagnosis not present

## 2019-11-04 DIAGNOSIS — I96 Gangrene, not elsewhere classified: Secondary | ICD-10-CM | POA: Diagnosis not present

## 2019-11-04 DIAGNOSIS — R0689 Other abnormalities of breathing: Secondary | ICD-10-CM | POA: Diagnosis not present

## 2019-11-04 DIAGNOSIS — R269 Unspecified abnormalities of gait and mobility: Secondary | ICD-10-CM | POA: Diagnosis not present

## 2019-11-04 DIAGNOSIS — L929 Granulomatous disorder of the skin and subcutaneous tissue, unspecified: Secondary | ICD-10-CM | POA: Diagnosis not present

## 2019-11-07 DIAGNOSIS — I96 Gangrene, not elsewhere classified: Secondary | ICD-10-CM | POA: Diagnosis not present

## 2019-11-07 DIAGNOSIS — G309 Alzheimer's disease, unspecified: Secondary | ICD-10-CM | POA: Diagnosis not present

## 2019-11-07 DIAGNOSIS — R69 Illness, unspecified: Secondary | ICD-10-CM | POA: Diagnosis not present

## 2019-11-07 DIAGNOSIS — R269 Unspecified abnormalities of gait and mobility: Secondary | ICD-10-CM | POA: Diagnosis not present

## 2019-11-07 DIAGNOSIS — L89159 Pressure ulcer of sacral region, unspecified stage: Secondary | ICD-10-CM | POA: Diagnosis not present

## 2019-11-07 DIAGNOSIS — L929 Granulomatous disorder of the skin and subcutaneous tissue, unspecified: Secondary | ICD-10-CM | POA: Diagnosis not present

## 2019-11-11 DIAGNOSIS — R1319 Other dysphagia: Secondary | ICD-10-CM | POA: Diagnosis not present

## 2019-11-11 DIAGNOSIS — R69 Illness, unspecified: Secondary | ICD-10-CM | POA: Diagnosis not present

## 2019-11-11 DIAGNOSIS — R627 Adult failure to thrive: Secondary | ICD-10-CM | POA: Diagnosis not present

## 2019-11-11 DIAGNOSIS — L89150 Pressure ulcer of sacral region, unstageable: Secondary | ICD-10-CM | POA: Diagnosis not present

## 2019-11-11 DIAGNOSIS — L89159 Pressure ulcer of sacral region, unspecified stage: Secondary | ICD-10-CM | POA: Diagnosis not present

## 2019-11-11 DIAGNOSIS — R0689 Other abnormalities of breathing: Secondary | ICD-10-CM | POA: Diagnosis not present

## 2019-11-14 DIAGNOSIS — L89159 Pressure ulcer of sacral region, unspecified stage: Secondary | ICD-10-CM | POA: Diagnosis not present

## 2019-11-18 DIAGNOSIS — L89159 Pressure ulcer of sacral region, unspecified stage: Secondary | ICD-10-CM | POA: Diagnosis not present

## 2019-11-21 DIAGNOSIS — L89150 Pressure ulcer of sacral region, unstageable: Secondary | ICD-10-CM | POA: Diagnosis not present

## 2019-11-25 DIAGNOSIS — L89159 Pressure ulcer of sacral region, unspecified stage: Secondary | ICD-10-CM | POA: Diagnosis not present

## 2019-11-28 DIAGNOSIS — L89159 Pressure ulcer of sacral region, unspecified stage: Secondary | ICD-10-CM | POA: Diagnosis not present

## 2019-12-01 DIAGNOSIS — L89159 Pressure ulcer of sacral region, unspecified stage: Secondary | ICD-10-CM | POA: Diagnosis not present

## 2019-12-02 DIAGNOSIS — M62461 Contracture of muscle, right lower leg: Secondary | ICD-10-CM | POA: Diagnosis not present

## 2019-12-02 DIAGNOSIS — M62462 Contracture of muscle, left lower leg: Secondary | ICD-10-CM | POA: Diagnosis not present

## 2019-12-02 DIAGNOSIS — L89150 Pressure ulcer of sacral region, unstageable: Secondary | ICD-10-CM | POA: Diagnosis not present

## 2019-12-02 DIAGNOSIS — Z751 Person awaiting admission to adequate facility elsewhere: Secondary | ICD-10-CM | POA: Diagnosis not present

## 2019-12-04 DIAGNOSIS — L89159 Pressure ulcer of sacral region, unspecified stage: Secondary | ICD-10-CM | POA: Diagnosis not present

## 2019-12-08 DIAGNOSIS — L89159 Pressure ulcer of sacral region, unspecified stage: Secondary | ICD-10-CM | POA: Diagnosis not present

## 2019-12-15 DIAGNOSIS — M533 Sacrococcygeal disorders, not elsewhere classified: Secondary | ICD-10-CM | POA: Diagnosis not present

## 2019-12-15 DIAGNOSIS — L89159 Pressure ulcer of sacral region, unspecified stage: Secondary | ICD-10-CM | POA: Diagnosis not present

## 2019-12-22 DIAGNOSIS — L89159 Pressure ulcer of sacral region, unspecified stage: Secondary | ICD-10-CM | POA: Diagnosis not present

## 2019-12-25 DIAGNOSIS — L89159 Pressure ulcer of sacral region, unspecified stage: Secondary | ICD-10-CM | POA: Diagnosis not present

## 2019-12-30 DIAGNOSIS — L89159 Pressure ulcer of sacral region, unspecified stage: Secondary | ICD-10-CM | POA: Diagnosis not present

## 2020-01-02 DIAGNOSIS — L89159 Pressure ulcer of sacral region, unspecified stage: Secondary | ICD-10-CM | POA: Diagnosis not present

## 2020-01-09 DIAGNOSIS — L89159 Pressure ulcer of sacral region, unspecified stage: Secondary | ICD-10-CM | POA: Diagnosis not present

## 2020-01-13 DIAGNOSIS — G309 Alzheimer's disease, unspecified: Secondary | ICD-10-CM | POA: Diagnosis not present

## 2020-01-13 DIAGNOSIS — I482 Chronic atrial fibrillation, unspecified: Secondary | ICD-10-CM | POA: Diagnosis not present

## 2020-01-13 DIAGNOSIS — I10 Essential (primary) hypertension: Secondary | ICD-10-CM | POA: Diagnosis not present

## 2020-01-13 DIAGNOSIS — I7090 Unspecified atherosclerosis: Secondary | ICD-10-CM | POA: Diagnosis not present

## 2020-01-16 DIAGNOSIS — Z79899 Other long term (current) drug therapy: Secondary | ICD-10-CM | POA: Diagnosis not present

## 2020-01-16 DIAGNOSIS — Z7189 Other specified counseling: Secondary | ICD-10-CM | POA: Diagnosis not present

## 2020-01-16 DIAGNOSIS — L89159 Pressure ulcer of sacral region, unspecified stage: Secondary | ICD-10-CM | POA: Diagnosis not present

## 2020-01-20 DIAGNOSIS — K5790 Diverticulosis of intestine, part unspecified, without perforation or abscess without bleeding: Secondary | ICD-10-CM | POA: Diagnosis not present

## 2020-01-20 DIAGNOSIS — J302 Other seasonal allergic rhinitis: Secondary | ICD-10-CM | POA: Diagnosis not present

## 2020-01-20 DIAGNOSIS — N400 Enlarged prostate without lower urinary tract symptoms: Secondary | ICD-10-CM | POA: Diagnosis not present

## 2020-01-20 DIAGNOSIS — I7090 Unspecified atherosclerosis: Secondary | ICD-10-CM | POA: Diagnosis not present

## 2020-01-20 DIAGNOSIS — L89159 Pressure ulcer of sacral region, unspecified stage: Secondary | ICD-10-CM | POA: Diagnosis not present

## 2020-01-20 DIAGNOSIS — Z79899 Other long term (current) drug therapy: Secondary | ICD-10-CM | POA: Diagnosis not present

## 2020-01-20 DIAGNOSIS — Z7189 Other specified counseling: Secondary | ICD-10-CM | POA: Diagnosis not present

## 2020-03-03 DIAGNOSIS — G4700 Insomnia, unspecified: Secondary | ICD-10-CM | POA: Diagnosis not present

## 2020-03-03 DIAGNOSIS — I10 Essential (primary) hypertension: Secondary | ICD-10-CM | POA: Diagnosis not present

## 2020-03-03 DIAGNOSIS — I482 Chronic atrial fibrillation, unspecified: Secondary | ICD-10-CM | POA: Diagnosis not present

## 2020-03-03 DIAGNOSIS — R69 Illness, unspecified: Secondary | ICD-10-CM | POA: Diagnosis not present

## 2020-03-04 DIAGNOSIS — N400 Enlarged prostate without lower urinary tract symptoms: Secondary | ICD-10-CM | POA: Diagnosis not present

## 2020-03-04 DIAGNOSIS — J302 Other seasonal allergic rhinitis: Secondary | ICD-10-CM | POA: Diagnosis not present

## 2020-03-04 DIAGNOSIS — I482 Chronic atrial fibrillation, unspecified: Secondary | ICD-10-CM | POA: Diagnosis not present

## 2020-03-04 DIAGNOSIS — K5790 Diverticulosis of intestine, part unspecified, without perforation or abscess without bleeding: Secondary | ICD-10-CM | POA: Diagnosis not present

## 2020-03-10 DIAGNOSIS — G309 Alzheimer's disease, unspecified: Secondary | ICD-10-CM | POA: Diagnosis not present

## 2020-03-10 DIAGNOSIS — G4700 Insomnia, unspecified: Secondary | ICD-10-CM | POA: Diagnosis not present

## 2020-03-10 DIAGNOSIS — I10 Essential (primary) hypertension: Secondary | ICD-10-CM | POA: Diagnosis not present

## 2020-03-10 DIAGNOSIS — I482 Chronic atrial fibrillation, unspecified: Secondary | ICD-10-CM | POA: Diagnosis not present

## 2020-03-11 DIAGNOSIS — G309 Alzheimer's disease, unspecified: Secondary | ICD-10-CM | POA: Diagnosis not present

## 2020-03-11 DIAGNOSIS — E039 Hypothyroidism, unspecified: Secondary | ICD-10-CM | POA: Diagnosis not present

## 2020-03-11 DIAGNOSIS — E870 Hyperosmolality and hypernatremia: Secondary | ICD-10-CM | POA: Diagnosis not present

## 2020-03-11 DIAGNOSIS — E860 Dehydration: Secondary | ICD-10-CM | POA: Diagnosis not present

## 2020-03-11 DIAGNOSIS — E119 Type 2 diabetes mellitus without complications: Secondary | ICD-10-CM | POA: Diagnosis not present

## 2020-03-11 DIAGNOSIS — I482 Chronic atrial fibrillation, unspecified: Secondary | ICD-10-CM | POA: Diagnosis not present

## 2020-03-11 DIAGNOSIS — E785 Hyperlipidemia, unspecified: Secondary | ICD-10-CM | POA: Diagnosis not present

## 2020-03-11 DIAGNOSIS — D649 Anemia, unspecified: Secondary | ICD-10-CM | POA: Diagnosis not present

## 2020-03-11 DIAGNOSIS — I10 Essential (primary) hypertension: Secondary | ICD-10-CM | POA: Diagnosis not present

## 2020-03-12 DIAGNOSIS — G309 Alzheimer's disease, unspecified: Secondary | ICD-10-CM | POA: Diagnosis not present

## 2020-03-12 DIAGNOSIS — I10 Essential (primary) hypertension: Secondary | ICD-10-CM | POA: Diagnosis not present

## 2020-03-12 DIAGNOSIS — R69 Illness, unspecified: Secondary | ICD-10-CM | POA: Diagnosis not present

## 2020-03-14 DIAGNOSIS — E876 Hypokalemia: Secondary | ICD-10-CM | POA: Diagnosis not present

## 2020-03-15 DIAGNOSIS — D649 Anemia, unspecified: Secondary | ICD-10-CM | POA: Diagnosis not present

## 2020-03-15 DIAGNOSIS — I10 Essential (primary) hypertension: Secondary | ICD-10-CM | POA: Diagnosis not present

## 2020-03-17 DIAGNOSIS — G309 Alzheimer's disease, unspecified: Secondary | ICD-10-CM | POA: Diagnosis not present

## 2020-03-17 DIAGNOSIS — E870 Hyperosmolality and hypernatremia: Secondary | ICD-10-CM | POA: Diagnosis not present

## 2020-03-17 DIAGNOSIS — R69 Illness, unspecified: Secondary | ICD-10-CM | POA: Diagnosis not present

## 2020-03-17 DIAGNOSIS — E860 Dehydration: Secondary | ICD-10-CM | POA: Diagnosis not present

## 2020-03-18 DIAGNOSIS — Z79899 Other long term (current) drug therapy: Secondary | ICD-10-CM | POA: Diagnosis not present

## 2020-03-18 DIAGNOSIS — D649 Anemia, unspecified: Secondary | ICD-10-CM | POA: Diagnosis not present

## 2020-03-18 DIAGNOSIS — R69 Illness, unspecified: Secondary | ICD-10-CM | POA: Diagnosis not present

## 2020-03-18 DIAGNOSIS — G309 Alzheimer's disease, unspecified: Secondary | ICD-10-CM | POA: Diagnosis not present

## 2020-03-22 DIAGNOSIS — I482 Chronic atrial fibrillation, unspecified: Secondary | ICD-10-CM | POA: Diagnosis not present

## 2020-03-24 ENCOUNTER — Emergency Department (HOSPITAL_COMMUNITY): Payer: Medicare HMO

## 2020-03-24 ENCOUNTER — Inpatient Hospital Stay (HOSPITAL_COMMUNITY)
Admission: EM | Admit: 2020-03-24 | Discharge: 2020-03-26 | DRG: 871 | Disposition: A | Discharge status: Hospice | Payer: Medicare HMO | Attending: Internal Medicine | Admitting: Internal Medicine

## 2020-03-24 ENCOUNTER — Encounter (HOSPITAL_COMMUNITY): Payer: Self-pay | Admitting: Internal Medicine

## 2020-03-24 ENCOUNTER — Other Ambulatory Visit: Payer: Self-pay

## 2020-03-24 DIAGNOSIS — I252 Old myocardial infarction: Secondary | ICD-10-CM | POA: Diagnosis not present

## 2020-03-24 DIAGNOSIS — K5732 Diverticulitis of large intestine without perforation or abscess without bleeding: Secondary | ICD-10-CM | POA: Diagnosis present

## 2020-03-24 DIAGNOSIS — I509 Heart failure, unspecified: Secondary | ICD-10-CM | POA: Diagnosis present

## 2020-03-24 DIAGNOSIS — R404 Transient alteration of awareness: Secondary | ICD-10-CM | POA: Diagnosis not present

## 2020-03-24 DIAGNOSIS — E860 Dehydration: Secondary | ICD-10-CM | POA: Diagnosis present

## 2020-03-24 DIAGNOSIS — L8990 Pressure ulcer of unspecified site, unspecified stage: Secondary | ICD-10-CM | POA: Insufficient documentation

## 2020-03-24 DIAGNOSIS — K651 Peritoneal abscess: Secondary | ICD-10-CM | POA: Diagnosis not present

## 2020-03-24 DIAGNOSIS — J189 Pneumonia, unspecified organism: Secondary | ICD-10-CM | POA: Diagnosis not present

## 2020-03-24 DIAGNOSIS — I63512 Cerebral infarction due to unspecified occlusion or stenosis of left middle cerebral artery: Secondary | ICD-10-CM | POA: Diagnosis present

## 2020-03-24 DIAGNOSIS — E870 Hyperosmolality and hypernatremia: Secondary | ICD-10-CM | POA: Diagnosis present

## 2020-03-24 DIAGNOSIS — I110 Hypertensive heart disease with heart failure: Secondary | ICD-10-CM | POA: Diagnosis present

## 2020-03-24 DIAGNOSIS — I2510 Atherosclerotic heart disease of native coronary artery without angina pectoris: Secondary | ICD-10-CM | POA: Diagnosis present

## 2020-03-24 DIAGNOSIS — R4182 Altered mental status, unspecified: Secondary | ICD-10-CM

## 2020-03-24 DIAGNOSIS — I480 Paroxysmal atrial fibrillation: Secondary | ICD-10-CM | POA: Diagnosis present

## 2020-03-24 DIAGNOSIS — K5792 Diverticulitis of intestine, part unspecified, without perforation or abscess without bleeding: Secondary | ICD-10-CM

## 2020-03-24 DIAGNOSIS — M2550 Pain in unspecified joint: Secondary | ICD-10-CM | POA: Diagnosis not present

## 2020-03-24 DIAGNOSIS — J9601 Acute respiratory failure with hypoxia: Secondary | ICD-10-CM | POA: Diagnosis present

## 2020-03-24 DIAGNOSIS — R5381 Other malaise: Secondary | ICD-10-CM | POA: Diagnosis not present

## 2020-03-24 DIAGNOSIS — G301 Alzheimer's disease with late onset: Secondary | ICD-10-CM | POA: Diagnosis not present

## 2020-03-24 DIAGNOSIS — R509 Fever, unspecified: Secondary | ICD-10-CM | POA: Diagnosis not present

## 2020-03-24 DIAGNOSIS — I639 Cerebral infarction, unspecified: Secondary | ICD-10-CM | POA: Diagnosis not present

## 2020-03-24 DIAGNOSIS — F0280 Dementia in other diseases classified elsewhere without behavioral disturbance: Secondary | ICD-10-CM

## 2020-03-24 DIAGNOSIS — Z515 Encounter for palliative care: Secondary | ICD-10-CM

## 2020-03-24 DIAGNOSIS — N390 Urinary tract infection, site not specified: Secondary | ICD-10-CM

## 2020-03-24 DIAGNOSIS — Z9861 Coronary angioplasty status: Secondary | ICD-10-CM

## 2020-03-24 DIAGNOSIS — R0902 Hypoxemia: Secondary | ICD-10-CM | POA: Diagnosis not present

## 2020-03-24 DIAGNOSIS — Z20822 Contact with and (suspected) exposure to covid-19: Secondary | ICD-10-CM | POA: Diagnosis present

## 2020-03-24 DIAGNOSIS — Z7401 Bed confinement status: Secondary | ICD-10-CM

## 2020-03-24 DIAGNOSIS — J690 Pneumonitis due to inhalation of food and vomit: Secondary | ICD-10-CM | POA: Diagnosis present

## 2020-03-24 DIAGNOSIS — D649 Anemia, unspecified: Secondary | ICD-10-CM | POA: Diagnosis not present

## 2020-03-24 DIAGNOSIS — Z823 Family history of stroke: Secondary | ICD-10-CM

## 2020-03-24 DIAGNOSIS — K6289 Other specified diseases of anus and rectum: Secondary | ICD-10-CM

## 2020-03-24 DIAGNOSIS — R0602 Shortness of breath: Secondary | ICD-10-CM | POA: Diagnosis not present

## 2020-03-24 DIAGNOSIS — R52 Pain, unspecified: Secondary | ICD-10-CM | POA: Diagnosis not present

## 2020-03-24 DIAGNOSIS — Z888 Allergy status to other drugs, medicaments and biological substances status: Secondary | ICD-10-CM

## 2020-03-24 DIAGNOSIS — Z7982 Long term (current) use of aspirin: Secondary | ICD-10-CM

## 2020-03-24 DIAGNOSIS — R05 Cough: Secondary | ICD-10-CM | POA: Diagnosis not present

## 2020-03-24 DIAGNOSIS — G309 Alzheimer's disease, unspecified: Secondary | ICD-10-CM | POA: Diagnosis present

## 2020-03-24 DIAGNOSIS — Z87891 Personal history of nicotine dependence: Secondary | ICD-10-CM | POA: Diagnosis not present

## 2020-03-24 DIAGNOSIS — E785 Hyperlipidemia, unspecified: Secondary | ICD-10-CM | POA: Diagnosis present

## 2020-03-24 DIAGNOSIS — Z66 Do not resuscitate: Secondary | ICD-10-CM | POA: Diagnosis present

## 2020-03-24 DIAGNOSIS — L02211 Cutaneous abscess of abdominal wall: Secondary | ICD-10-CM | POA: Diagnosis not present

## 2020-03-24 DIAGNOSIS — A419 Sepsis, unspecified organism: Secondary | ICD-10-CM | POA: Diagnosis not present

## 2020-03-24 DIAGNOSIS — R69 Illness, unspecified: Secondary | ICD-10-CM | POA: Diagnosis not present

## 2020-03-24 DIAGNOSIS — I4891 Unspecified atrial fibrillation: Secondary | ICD-10-CM | POA: Diagnosis not present

## 2020-03-24 DIAGNOSIS — Z7901 Long term (current) use of anticoagulants: Secondary | ICD-10-CM | POA: Diagnosis not present

## 2020-03-24 DIAGNOSIS — Z7189 Other specified counseling: Secondary | ICD-10-CM

## 2020-03-24 DIAGNOSIS — I482 Chronic atrial fibrillation, unspecified: Secondary | ICD-10-CM | POA: Diagnosis not present

## 2020-03-24 DIAGNOSIS — Z8249 Family history of ischemic heart disease and other diseases of the circulatory system: Secondary | ICD-10-CM

## 2020-03-24 DIAGNOSIS — L89153 Pressure ulcer of sacral region, stage 3: Secondary | ICD-10-CM | POA: Diagnosis present

## 2020-03-24 DIAGNOSIS — G4700 Insomnia, unspecified: Secondary | ICD-10-CM | POA: Diagnosis not present

## 2020-03-24 LAB — CBC WITH DIFFERENTIAL/PLATELET
Abs Immature Granulocytes: 0.4 10*3/uL — ABNORMAL HIGH (ref 0.00–0.07)
Basophils Absolute: 0 10*3/uL (ref 0.0–0.1)
Basophils Relative: 0 %
Eosinophils Absolute: 0 10*3/uL (ref 0.0–0.5)
Eosinophils Relative: 0 %
HCT: 34.5 % — ABNORMAL LOW (ref 39.0–52.0)
Hemoglobin: 10.5 g/dL — ABNORMAL LOW (ref 13.0–17.0)
Lymphocytes Relative: 5 %
Lymphs Abs: 0.9 10*3/uL (ref 0.7–4.0)
MCH: 30 pg (ref 26.0–34.0)
MCHC: 30.4 g/dL (ref 30.0–36.0)
MCV: 98.6 fL (ref 80.0–100.0)
Monocytes Absolute: 0 10*3/uL — ABNORMAL LOW (ref 0.1–1.0)
Monocytes Relative: 0 %
Myelocytes: 1 %
Neutro Abs: 16.8 10*3/uL — ABNORMAL HIGH (ref 1.7–7.7)
Neutrophils Relative %: 93 %
Platelets: 303 10*3/uL (ref 150–400)
Promyelocytes Relative: 1 %
RBC: 3.5 MIL/uL — ABNORMAL LOW (ref 4.22–5.81)
RDW: 16.1 % — ABNORMAL HIGH (ref 11.5–15.5)
WBC: 18.1 10*3/uL — ABNORMAL HIGH (ref 4.0–10.5)
nRBC: 0 /100 WBC
nRBC: 0.1 % (ref 0.0–0.2)

## 2020-03-24 LAB — POCT I-STAT EG7
Acid-base deficit: 1 mmol/L (ref 0.0–2.0)
Bicarbonate: 21.7 mmol/L (ref 20.0–28.0)
Calcium, Ion: 1.13 mmol/L — ABNORMAL LOW (ref 1.15–1.40)
HCT: 29 % — ABNORMAL LOW (ref 39.0–52.0)
Hemoglobin: 9.9 g/dL — ABNORMAL LOW (ref 13.0–17.0)
O2 Saturation: 74 %
Potassium: 4.5 mmol/L (ref 3.5–5.1)
Sodium: 155 mmol/L — ABNORMAL HIGH (ref 135–145)
TCO2: 23 mmol/L (ref 22–32)
pCO2, Ven: 29.4 mmHg — ABNORMAL LOW (ref 44.0–60.0)
pH, Ven: 7.476 — ABNORMAL HIGH (ref 7.250–7.430)
pO2, Ven: 36 mmHg (ref 32.0–45.0)

## 2020-03-24 LAB — COMPREHENSIVE METABOLIC PANEL
ALT: 16 U/L (ref 0–44)
AST: 28 U/L (ref 15–41)
Albumin: 2 g/dL — ABNORMAL LOW (ref 3.5–5.0)
Alkaline Phosphatase: 68 U/L (ref 38–126)
Anion gap: 13 (ref 5–15)
BUN: 36 mg/dL — ABNORMAL HIGH (ref 8–23)
CO2: 21 mmol/L — ABNORMAL LOW (ref 22–32)
Calcium: 8.8 mg/dL — ABNORMAL LOW (ref 8.9–10.3)
Chloride: 121 mmol/L — ABNORMAL HIGH (ref 98–111)
Creatinine, Ser: 1.19 mg/dL (ref 0.61–1.24)
GFR calc Af Amer: >60 mL/min (ref >60)
GFR calc non Af Amer: 57 mL/min — ABNORMAL LOW (ref >60)
Glucose, Bld: 113 mg/dL — ABNORMAL HIGH (ref 70–99)
Potassium: 4.7 mmol/L (ref 3.5–5.1)
Sodium: 155 mmol/L — ABNORMAL HIGH (ref 135–145)
Total Bilirubin: 1 mg/dL (ref 0.3–1.2)
Total Protein: 6.5 g/dL (ref 6.5–8.1)

## 2020-03-24 LAB — URINALYSIS, MICROSCOPIC (REFLEX): WBC, UA: >50 WBC/hpf (ref 0–5)

## 2020-03-24 LAB — LACTIC ACID, PLASMA
Lactic Acid, Venous: 2.3 mmol/L (ref 0.5–1.9)
Lactic Acid, Venous: 2.2 mmol/L (ref 0.5–1.9)

## 2020-03-24 LAB — RESPIRATORY PANEL BY RT PCR (FLU A&B, COVID)
Influenza A by PCR: NEGATIVE
Influenza B by PCR: NEGATIVE
SARS Coronavirus 2 by RT PCR: NEGATIVE

## 2020-03-24 LAB — POC SARS CORONAVIRUS 2 AG -  ED: SARS Coronavirus 2 Ag: NEGATIVE

## 2020-03-24 LAB — URINALYSIS, ROUTINE W REFLEX MICROSCOPIC
Glucose, UA: 100 mg/dL — AB
Ketones, ur: 15 mg/dL — AB
Nitrite: POSITIVE — AB
Protein, ur: 100 mg/dL — AB
Specific Gravity, Urine: >1.03 — ABNORMAL HIGH (ref 1.005–1.030)
pH: 5 (ref 5.0–8.0)

## 2020-03-24 LAB — PROTIME-INR
INR: 1.4 — ABNORMAL HIGH (ref 0.8–1.2)
Prothrombin Time: 17.2 seconds — ABNORMAL HIGH (ref 11.4–15.2)

## 2020-03-24 LAB — APTT: aPTT: 28 seconds (ref 24–36)

## 2020-03-24 MED ORDER — ACETAMINOPHEN 650 MG RE SUPP
650.0000 mg | Freq: Once | RECTAL | Status: AC
  Administered 2020-03-24: 18:00:00 650 mg via RECTAL
  Filled 2020-03-24: qty 1

## 2020-03-24 MED ORDER — ACETAMINOPHEN 160 MG/5ML PO SOLN: 650.0000 mg | ORAL | Status: DC | PRN

## 2020-03-24 MED ORDER — SODIUM CHLORIDE 0.9 % IV BOLUS (SEPSIS)
1000.0000 mL | Freq: Once | INTRAVENOUS | Status: AC
  Administered 2020-03-24: 18:00:00 1000 mL via INTRAVENOUS

## 2020-03-24 MED ORDER — PIPERACILLIN-TAZOBACTAM 3.375 G IVPB
3.3750 g | Freq: Three times a day (TID) | INTRAVENOUS | Status: DC
  Administered 2020-03-24 – 2020-03-25 (×2): 3.375 g via INTRAVENOUS
  Filled 2020-03-24 (×3): qty 50

## 2020-03-24 MED ORDER — ACETAMINOPHEN 650 MG RE SUPP
650.0000 mg | RECTAL | Status: DC | PRN
  Administered 2020-03-25 (×2): 650 mg via RECTAL
  Filled 2020-03-24 (×3): qty 1

## 2020-03-24 MED ORDER — IOHEXOL 350 MG/ML SOLN
100.0000 mL | Freq: Once | INTRAVENOUS | Status: AC | PRN
  Administered 2020-03-24: 20:00:00 100 mL via INTRAVENOUS

## 2020-03-24 MED ORDER — SODIUM CHLORIDE 0.9 % IV BOLUS (SEPSIS)
250.0000 mL | Freq: Once | INTRAVENOUS | Status: AC
  Administered 2020-03-24: 250 mL via INTRAVENOUS

## 2020-03-24 MED ORDER — SODIUM CHLORIDE 0.9 % IV SOLN
2.0000 g | INTRAVENOUS | Status: DC
  Administered 2020-03-24: 18:00:00 2 g via INTRAVENOUS
  Filled 2020-03-24: qty 20

## 2020-03-24 MED ORDER — SODIUM CHLORIDE 0.9 % IV SOLN
500.0000 mg | INTRAVENOUS | Status: DC
  Administered 2020-03-24: 500 mg via INTRAVENOUS
  Filled 2020-03-24: qty 500

## 2020-03-24 MED ORDER — SENNOSIDES-DOCUSATE SODIUM 8.6-50 MG PO TABS: 1.0000 | ORAL_TABLET | Freq: Every evening | ORAL | Status: DC | PRN

## 2020-03-24 MED ORDER — DEXTROSE 5 % IV SOLN: INTRAVENOUS | Status: DC

## 2020-03-24 MED ORDER — VALPROATE SODIUM 500 MG/5ML IV SOLN
95.0000 mg | Freq: Four times a day (QID) | INTRAVENOUS | Status: DC
  Administered 2020-03-25 – 2020-03-26 (×6): 95 mg via INTRAVENOUS
  Filled 2020-03-24 (×11): qty 0.95

## 2020-03-24 MED ORDER — STROKE: EARLY STAGES OF RECOVERY BOOK: Freq: Once | Status: DC

## 2020-03-24 MED ORDER — METOPROLOL TARTRATE 5 MG/5ML IV SOLN: 5.0000 mg | Freq: Four times a day (QID) | INTRAVENOUS | Status: DC | PRN

## 2020-03-24 MED ORDER — ACETAMINOPHEN 325 MG PO TABS: 650.0000 mg | ORAL_TABLET | ORAL | Status: DC | PRN

## 2020-03-24 MED ORDER — HEPARIN SODIUM (PORCINE) 5000 UNIT/ML IJ SOLN
5000.0000 [IU] | Freq: Three times a day (TID) | INTRAMUSCULAR | Status: DC
  Filled 2020-03-24: qty 1

## 2020-03-24 NOTE — H&P (Signed)
History and Physical    Joseph Winters [id number] DOB: 07-06-1935 DOA: 03/24/2020  PCP: Lavone Orn, MD  Patient coming from: [facility]  I have personally briefly reviewed patient's old medical records in [place]  Chief Complaint: Shortness of breath, altered mental status  HPI: Joseph Winters is a 85 y.o. male with medical history significant for atrial fibrillation on anticoagulation, history of subdural hemorrhage, CAD on aspirin 81 mg daily, hypertension, and advanced Alzheimer's dementia who presents to the ED for evaluation of increased work of breathing and change in mentation.  Patient unable to provide any significant history therefore entirety of history is obtained from wife at bedside, EDP, and chart review.  Per wife, patient has advanced Alzheimer's dementia.  At his baseline he is nonverbal and nonambulatory.  He does not really follow commands but will track with his eyes and move his upper extremities with some purpose.  Patient is currently resident at [facility].  Staff noted that he appeared to be having increased work of breathing and no change in level of interaction.  EMS were called and per ED documentation he was noted to have O2 saturation of 83% on room air with tachypnea and tachycardia.  He was brought to the ED for further evaluation and management.  ED Course:  Initial vitals showed BP 128/70, pulse 118, RR 26, SPO2 93% on 15 L NRB.  Patient had fever per EDP.  Labs are notable for sodium 155, potassium 4.7, bicarb 21, BUN 36, creatinine 1.19, WBC 18.1, hemoglobin 10.5, platelets 303,000.  Lactic acid 2.3.  VBG showed pH 7.476, PCO2 29.4, PO2 36.  Urinalysis showed positive nitrites, moderate leukocytes, 0-5 RBCs, >50 WBCs, many bacteria microscopy.  Blood and urine cultures were obtained and pending.  POC SARS-CoV-2 antigen is negative.  SARS-CoV-2 PCR respiratory panel is obtained and pending.  Portable chest x-ray was  negative for focal consolidation, edema, effusion, or pneumothorax.  CT head without contrast showed acute to subacute left middle cerebral artery distribution ischemia.  CTA chest PE study was negative for evidence of acute PE.  Right lower lobe pneumonia with additional patchy atelectasis/consolidation noted.  CT abdomen/pelvis with contrast showed changes suggestive of distal sigmoid colonic diverticulitis and proctitis.  Patient was given 2.25 L normal saline, IV ceftriaxone and azithromycin.  Neurology were consulted and seen the patient.  The hospitalist service was consulted for further evaluation management.  Review of Systems: All systems reviewed and are negative except as documented in history of present illness above.   Past Medical History:  Diagnosis Date  . Alzheimer disease ([place])   . Atrial fibrillation ([place])   . Coronary artery disease   . Dysrhythmia 2009   24hr holter-PVC'S  . Hypertension 08/28/2008   Echo EF 45-50%  . Myocardial infarction ([place]) 1993  . Subdural hemorrhage [facility])     Past Surgical History:  Procedure Laterality Date  . CORONARY ANGIOPLASTY     initial inferior MI 1993. 1994 Repeat PTCA    Social History:  reports that he quit smoking about 34 years ago. His smoking use included cigarettes. He has never used smokeless tobacco. He reports current alcohol use. He reports that he does not use drugs.  Allergies  Allergen Reactions  . Memantine     Other reaction(s): Confusion (intolerance) Made mental status worse    Family History  Problem Relation Age of Onset  . Stroke Mother   . Heart disease Father  Prior to Admission medications   Medication Sig Start Date End Date Taking? Authorizing Provider  acetaminophen (TYLENOL) 500 MG tablet Take 1-2 tablets (500-1,000 mg total) by mouth every 6 (six) hours as needed for mild pain or moderate pain. Patient taking differently: Take 325 mg by mouth in the morning, at noon, and at  bedtime.  08/12/17  Yes Duffy Bruce, MD  aspirin 81 MG tablet Take 81 mg by mouth at bedtime.    Yes [provider]  atorvastatin (LIPITOR) 10 MG tablet Take 1 tablet (10 mg total) by mouth daily. 09/27/17 03/24/20 Yes Troy Sine, MD  citalopram (CELEXA) 10 MG tablet Take 10 mg by mouth every morning. 02/16/18  Yes [provider]  collagenase (SANTYL) ointment Apply 1 application topically daily.   Yes [provider]  divalproex (DEPAKOTE SPRINKLE) 125 MG capsule Take 125 mg by mouth 3 (three) times daily. 02/24/20  Yes [provider]  donepezil (ARICEPT) 10 MG tablet Take 10 mg by mouth daily.    Yes [provider]  doxazosin (CARDURA) 1 MG tablet Take 1 tablet (1 mg total) by mouth daily. 09/19/17  Yes Troy Sine, MD  hydrALAZINE (APRESOLINE) 10 MG tablet Take 10 mg by mouth 2 (two) times daily. 02/24/20  Yes [provider]  isosorbide mononitrate (IMDUR) 60 MG 24 hr tablet Take 1 tablet (60 mg total) by mouth daily. 09/19/17  Yes Troy Sine, MD  Melatonin 3 MG TBDP Take 1 tablet by mouth at bedtime.   Yes [provider]  Metoprolol Succinate 50 MG CS24 Take 25 mg by mouth daily. 09/19/17  Yes Troy Sine, MD  Multiple Vitamins-Minerals (OCUVITE ADULT FORMULA PO) Take 1 capsule by mouth daily.   Yes [provider]  potassium chloride SA (KLOR-CON) 20 MEQ tablet Take 20 mEq by mouth 2 (two) times daily.  03/17/20  Yes [provider]  ramipril (ALTACE) 5 MG capsule Take 15 mg by mouth daily.   Yes [provider]  ramipril (ALTACE) 10 MG capsule Take 1 capsule (10 mg total) by mouth daily. Patient not taking: Reported on 03/24/2020 09/19/17   Troy Sine, MD    Physical Exam: Vitals:   03/24/20 1656 03/24/20 1700 03/24/20 1702 03/24/20 1900  BP:   128/70 128/61  Pulse: (!) 123     Resp: (!) 27   (!) 28  SpO2: 94% 93%     Constitutional: Chronically ill-appearing man resting  supine in bed, calm, somnolent, nonverbal and not following commands.  Eyes: Right-sided ptosis, PERRL, lids and conjunctivae normal ENMT: Mucous membranes are dry.  Thick mucus in oropharynx. Neck: normal, supple, no masses. Respiratory: Coarse rhonchorous breath sounds bilaterally anteriorly.  Increased work of breathing. Cardiovascular: Tachycardic, irregularly irregular, no murmurs / rubs / gallops. No extremity edema. Abdomen: no tenderness, no masses palpated. No hepatosplenomegaly.  Musculoskeletal: Posturing of bilateral upper and lower extremities, moves all extremities spontaneously.  Skin: no rashes, lesions, ulcers. No induration Neurologic: Exam limited due to participation.  Moving all extremities spontaneously Psychiatric: Somnolent, nonverbal, noninteractive.    Labs on Admission: I have personally reviewed following labs and imaging studies  CBC: Recent Labs  Lab 03/24/20 1704 03/24/20 1712  WBC 18.1*  --   NEUTROABS 16.8*  --   HGB 10.5* 9.9*  HCT 34.5* 29.0*  MCV 98.6  --   PLT 303  --    Basic Metabolic Panel: Recent Labs  Lab 03/24/20 1704 03/24/20 1712  NA 155* 155*  K 4.7 4.5  CL 121*  --   CO2 21*  --   GLUCOSE 113*  --   BUN 36*  --   CREATININE 1.19  --   CALCIUM 8.8*  --    GFR: CrCl cannot be calculated (Unknown ideal weight.). Liver Function Tests: Recent Labs  Lab 03/24/20 1704  AST 28  ALT 16  ALKPHOS 68  BILITOT 1.0  PROT 6.5  ALBUMIN 2.0*   No results for input(s): LIPASE, AMYLASE in the last 168 hours. No results for input(s): AMMONIA in the last 168 hours. Coagulation Profile: Recent Labs  Lab 03/24/20 1704  INR 1.4*   Cardiac Enzymes: No results for input(s): CKTOTAL, CKMB, CKMBINDEX, TROPONINI in the last 168 hours. BNP (last 3 results) No results for input(s): PROBNP in the last 8760 hours. HbA1C: No results for input(s): HGBA1C in the last 72 hours. CBG: No results for input(s): GLUCAP in the last 168  hours. Lipid Profile: No results for input(s): CHOL, HDL, LDLCALC, TRIG, CHOLHDL, LDLDIRECT in the last 72 hours. Thyroid Function Tests: No results for input(s): TSH, T4TOTAL, FREET4, T3FREE, THYROIDAB in the last 72 hours. Anemia Panel: No results for input(s): VITAMINB12, FOLATE, FERRITIN, TIBC, IRON, RETICCTPCT in the last 72 hours. Urine analysis:    Component Value Date/Time   COLORURINE BROWN (A) 03/24/2020 1830   APPEARANCEUR TURBID (A) 03/24/2020 1830   LABSPEC >1.030 (H) 03/24/2020 1830   PHURINE 5.0 03/24/2020 1830   GLUCOSEU 100 (A) 03/24/2020 1830   HGBUR LARGE (A) 03/24/2020 1830   BILIRUBINUR MODERATE (A) 03/24/2020 1830   KETONESUR 15 (A) 03/24/2020 1830   PROTEINUR 100 (A) 03/24/2020 1830   UROBILINOGEN 0.2 01/21/2014 1904   NITRITE POSITIVE (A) 03/24/2020 1830   LEUKOCYTESUR MODERATE (A) 03/24/2020 1830    Radiological Exams on Admission: CT Head Wo Contrast  Result Date: 03/24/2020 CLINICAL DATA:  Increasing altered mental status EXAM: CT HEAD WITHOUT CONTRAST TECHNIQUE: Contiguous axial images were obtained from the base of the skull through the vertex without intravenous contrast. COMPARISON:  09/29/2019 FINDINGS: Brain: There is a geographic area of decreased attenuation identified in the left frontal parietal region best seen on image number 26 of series 3 which is new from the prior exam consistent with subacute ischemia in the distribution of the left middle cerebral artery. Scattered atrophic changes and chronic white matter ischemic change is seen. Changes of prior left temporal infarct are noted. No acute hemorrhage or space-occupying mass lesion is noted. Vascular: No hyperdense vessel or unexpected calcification. Skull: Normal. Negative for fracture or focal lesion. Sinuses/Orbits: No acute finding. Other: None. IMPRESSION: Area of new decreased attenuation in the distribution of the left middle cerebral artery as described consistent with acute to subacute  ischemia. Chronic atrophic and ischemic changes. Electronically Signed   By: Inez Catalina M.D.   On: 03/24/2020 19:50   CT Angio Chest PE W/Cm &/Or Wo Cm  Result Date: 03/24/2020 CLINICAL DATA:  Shortness of breath EXAM: CT ANGIOGRAPHY CHEST WITH CONTRAST TECHNIQUE: Multidetector CT imaging of the chest was performed using the standard protocol during bolus administration of intravenous contrast. Multiplanar CT image reconstructions and MIPs were obtained to evaluate the vascular anatomy. CONTRAST:  170mL OMNIPAQUE IOHEXOL 350 MG/ML SOLN COMPARISON:  None. FINDINGS: Cardiovascular: Satisfactory opacification of the pulmonary arteries to the segmental level. No evidence of pulmonary embolism. Cardiomegaly with biatrial enlargement. No pericardial effusion. Coronary artery and aortic calcification. Mediastinum/Nodes: Likely reactive mildly prominent right suprahilar  node. Thyroid is unremarkable. Esophagus is unremarkable. Lungs/Pleura: Right basilar consolidation. Additional patchy atelectasis/consolidation. Mucous plugging noted. No pleural effusion or pneumothorax. Upper Abdomen: Dictated separately Musculoskeletal: Degenerative changes of the thoracic spine. Review of the MIP images confirms the above findings. IMPRESSION: No evidence of acute pulmonary embolism. Right lower lobe pneumonia. Additional patchy atelectasis/consolidation. Coronary artery calcification. Aortic Atherosclerosis (ICD10-I70.0). Electronically Signed   By: Macy Mis M.D.   On: 03/24/2020 20:07   CT Abdomen Pelvis W Contrast  Result Date: 03/24/2020 CLINICAL DATA:  Abdominal abscess EXAM: CT ABDOMEN AND PELVIS WITH CONTRAST TECHNIQUE: Multidetector CT imaging of the abdomen and pelvis was performed using the standard protocol following bolus administration of intravenous contrast. CONTRAST:  13mL OMNIPAQUE IOHEXOL 350 MG/ML SOLN COMPARISON:  None. FINDINGS: Lower chest: The visualized heart size within normal limits. No  pericardial fluid/thickening. No hiatal hernia. Patchy airspace opacity seen at the posterior right lung base. Hepatobiliary: The liver is normal in density without focal abnormality.The main portal vein is patent. No evidence of calcified gallstones, gallbladder wall thickening or biliary dilatation. Pancreas: Unremarkable. No pancreatic ductal dilatation or surrounding inflammatory changes. Spleen: Normal in size without focal abnormality. Adrenals/Urinary Tract: Both adrenal glands appear normal. The kidneys and collecting system appear normal without evidence of urinary tract calculus or hydronephrosis. There is diffuse bladder wall thickening present. Stomach/Bowel: The stomach, small bowel, are normal in appearance. Scattered colonic diverticula are noted. There also appears to be diffuse wall thickening seen around the distal sigmoid colon and rectum with presacral edema and mild fat stranding changes. No loculated there is also non loculated fluid seen posterior to the sigmoid rectal junction, series 5, image 60. No free air however is noted. Vascular/Lymphatic: There are no enlarged mesenteric, retroperitoneal, or pelvic lymph nodes. Scattered aortic atherosclerotic calcifications are seen without aneurysmal dilatation. Reproductive: The prostate gland is heterogeneous and slightly enlarged. Other: No evidence of abdominal wall mass or hernia. Musculoskeletal: No acute or significant osseous findings. IMPRESSION: 1. Findings which could be suggestive of distal sigmoid colonic diverticulitis and proctitis. There is surrounding non loculated fluid/phlegmon seen adjacent to the distal sigmoid colon. No definite loculated fluid collection or free air is seen. 2. Partially decompressed bladder with diffuse wall thickening which could be due to chronic cystitis. 3.  Aortic Atherosclerosis (ICD10-I70.0). 4. Partially visualized patchy airspace opacity at the posterior right lung base, likely due to  aspiration/pneumonia. Electronically Signed   By: Prudencio Pair M.D.   On: 03/24/2020 20:11   DG Chest Port 1 View  Result Date: 03/24/2020 CLINICAL DATA:  85 year old male code sepsis. EXAM: PORTABLE CHEST 1 VIEW COMPARISON:  Chest CTA 09/29/2019 and earlier. FINDINGS: Portable AP semi upright view at 1715 hours. Mildly rotated to the right. Skin folds projecting over the chest. Lower lung volumes. Stable cardiomegaly and mediastinal contours. Calcified aortic atherosclerosis. Stable lung markings, no acute pulmonary opacity. No pneumothorax or pleural effusion identified. Negative visible bowel gas pattern. No acute osseous abnormality identified. IMPRESSION: 1.  No acute cardiopulmonary abnormality. 2.  Aortic Atherosclerosis (ICD10-I70.0). Electronically Signed   By: Genevie Ann M.D.   On: 03/24/2020 17:36    EKG: Independently reviewed.  Atrial fibrillation, incomplete left bundle branch block, PVCs present..Rate is faster when compared to prior.  Assessment/Plan Principal Problem:   Sepsis due to pneumonia [facility]) Active Problems:   CAD (coronary artery disease)   Chronic atrial fibrillation (HCC)   Alzheimer disease ([place])   Sigmoid diverticulitis   Proctitis   Sepsis secondary to  UTI [facility])   Hypernatremia  Joseph Winters is a 85 y.o. male with medical history significant for atrial fibrillation on anticoagulation, history of subdural hemorrhage, CAD on aspirin 81 mg daily, hypertension, and advanced Alzheimer's dementia who is admitted with acute hypoxic respiratory failure and sepsis due to right lower lobe pneumonia, acute to subacute ischemic stroke, and hypernatremia.  Acute respiratory failure with hypoxia Sepsis due to right lower lobe pneumonia, UTI, sigmoid diverticulitis/proctitis: Suspect aspiration pneumonia as primary source but also has evidence of UTI and CT imaging suggests sigmoid diverticulitis and proctitis.  Significant amount of mucus suctioned by respiratory therapy  with improvement in oxygenation. -Start IV Zosyn empirically for aspiration pneumonia/UTI/diverticulitis/proctitis coverage -Continue supplemental oxygen as needed -Follow blood and urine cultures -Continue naso/tracheal suctioning as needed -Continue aspiration precautions, SLP eval -Keep n.p.o. for now  Acute to subacute left MCA ischemia: As seen on CT imaging.  Neurology has evaluated and recommended no further stroke work-up at this time as it would not change management.  He has a history of subdural hemorrhage therefore not on anticoagulation in setting of known atrial fibrillation. -Resume aspirin 81 mg daily and atorvastatin when able to reliably take oral medications  Hypernatremia: In setting of dehydration from poor oral intake.  Start normal saline and start D5W @ 100 mL/hr overnight and repeat labs in the morning.  Chronic atrial fibrillation: Not on anticoagulation due to history of subdural hemorrhage.  Has been on aspirin 81 mg alone.  Remains in atrial fibrillation with borderline tachycardia.  On metoprolol as an outpatient. -Continue IV metoprolol as needed while n.p.o. -Resume aspirin when able  Hypertension: Metoprolol, Imdur, ramipril on hold while n.p.o.  CAD: Aspirin, statin, metoprolol, Imdur on hold while NPO.  End-stage Alzheimer's dementia: Nonverbal, nonambulatory, and minimally interactive at baseline.  He has acute worsening in setting of sepsis and CVA.  Neurology recommending continuing valproic acid (likely for mood stabilization) as he is at risk for withdrawal seizure. -Aricept on hold -Depakote converted to IV valproic acid with pharmacy assistance while NPO. -Delirium precautions -Patient is DNR/DNI  DVT prophylaxis: SCDs Code Status: DNR/DNI, confirmed with patient's wife Family Communication: Discussed with patient's wife at bedside Consults called: Neurology Admission status:  Status is: Inpatient  Remains inpatient appropriate  because:IV treatments appropriate due to intensity of illness or inability to take PO   Dispo: The patient is from: SNF              Anticipated d/c is to: SNF              Anticipated d/c date is: > 3 days              Patient currently is not medically stable to d/c.    Zada Finders MD [facility]  If 7PM-7AM, please contact night-coverage [email or web address]  03/24/2020, 9:15 PM

## 2020-03-24 NOTE — Consult Note (Signed)
Referring Physician: Dr. Melina Copa    Chief Complaint: AMS on a baseline of severe dementia  HPI: Joseph Winters is an 85 y.o. male with Alzheimer disease, atrial fibrillation, CAD, HTN, prior MI and prior history of subdural hemorrhage who presents to the ED with new onset of confusion in conjunction with fever, tachypnea and tachycardia.    [place] in the [facility] of [place]. He has been classified as a code sepsis with elevated lactate and low BP. He has had prominently rhonchorous respirations with low O2 sat of 83 on RA. EMS reported absent lung sounds on the left. He was hospitalized in October for aspiration pneumonia.    At baseline he is bedbound, nonverbal, unable to follow commands, but will gaze at wife and move his upper extremities semipurposefully.    LSN: Approximately 1 week ago tPA Given: No: Out of time window  Past Medical History:  Diagnosis Date  . Alzheimer disease   . Atrial fibrillation ([place])   . Coronary artery disease   . Dysrhythmia 2009   24hr holter-PVC'S  . Hypertension 08/28/2008   Echo EF 45-50%  . Myocardial infarction ([place]) 1993  . Subdural hemorrhage [facility])     Past Surgical History:  Procedure Laterality Date  . CORONARY ANGIOPLASTY     initial inferior MI [place] Repeat PTCA    No family history on file. Social History:  reports that he quit smoking about 34 years ago. His smoking use included cigarettes. He has never used smokeless tobacco. He reports current alcohol use. He reports that he does not use drugs.  Allergies:  Allergies  Allergen Reactions  . Memantine     Other reaction(s): Confusion (intolerance) Made mental status worse    Home Medications:  No current facility-administered medications on file prior to encounter.   Current Outpatient Medications on File Prior to Encounter  Medication Sig Dispense Refill  . acetaminophen (TYLENOL) 500 MG tablet Take 1-2 tablets (500-1,000 mg total) by mouth every 6  (six) hours as needed for mild pain or moderate pain. (Patient taking differently: Take 325 mg by mouth in the morning, at noon, and at bedtime. ) 30 tablet 0  . aspirin 81 MG tablet Take 81 mg by mouth at bedtime.     Marland Kitchen atorvastatin (LIPITOR) 10 MG tablet Take 1 tablet (10 mg total) by mouth daily. 90 tablet 1  . citalopram (CELEXA) 10 MG tablet Take 10 mg by mouth every morning.    . collagenase (SANTYL) ointment Apply 1 application topically daily.    . divalproex (DEPAKOTE SPRINKLE) 125 MG capsule Take 125 mg by mouth 3 (three) times daily.    Marland Kitchen donepezil (ARICEPT) 10 MG tablet Take 10 mg by mouth daily.     Marland Kitchen doxazosin (CARDURA) 1 MG tablet Take 1 tablet (1 mg total) by mouth daily. 90 tablet 2  . hydrALAZINE (APRESOLINE) 10 MG tablet Take 10 mg by mouth 2 (two) times daily.    . isosorbide mononitrate (IMDUR) 60 MG 24 hr tablet Take 1 tablet (60 mg total) by mouth daily. 90 tablet 2  . Melatonin 3 MG TBDP Take 1 tablet by mouth at bedtime.    . Metoprolol Succinate 50 MG CS24 Take 25 mg by mouth daily. 45 capsule 3  . Multiple Vitamins-Minerals (OCUVITE ADULT FORMULA PO) Take 1 capsule by mouth daily.    . potassium chloride SA (KLOR-CON) 20 MEQ tablet Take 20 mEq by mouth 2 (two) times daily.     Marland Kitchen  ramipril (ALTACE) 5 MG capsule Take 15 mg by mouth daily.    . ramipril (ALTACE) 10 MG capsule Take 1 capsule (10 mg total) by mouth daily. (Patient not taking: Reported on 03/24/2020) 90 capsule 2    ROS: Unable to obtain due to AMS.   Physical Examination: Blood pressure 128/61, pulse (!) 123, resp. rate (!) 28, SpO2 93 %.  HEENT: [place]/AT. Mild resistance to passive neck flexion. No stiffness of neck in lateral rotation.  Lungs: On O2 via NRB mask. Grossly audible rhonchi as well as frequent weak coughing.  Ext: No cyanosis  Neurologic Examination: Ment: Somnolent initially. Awakens after about 5 minutes of continuous stimulation during exam. Stares straight forward and does not gaze  towards or away from visual stimuli. Nonverbal. Not following commands. Does not localize to noxious stimuli while in the awake state.  CN: No blink to threat. Pupils pinpoint bilaterally. No forced gaze deviation. Oculocephalic reflex intact. Face symmetric. Grimaces to noxious. Cough reflex intact. Head midline. Unable to assess tongue protrusion.  Motor: Mild flexor posturing of upper extremities bilaterally, with increased triceps tone. Right wrist is flaccid. Does not move BUE to pinch.  Moderate to severe flexor posturing of BLE. Weak withdrawal to noxious plantar stimulation bilaterally. RLE withdraws more weakly to plantar stimulation than LLE.  Reflexes: 2+ bilateral biceps and brachioradialis. 0 patellae and achilles bilaterally. Left toe upgoing, right toe mute.  Cerebellar/Gait: Unable to assess  Results for orders placed or performed during the hospital encounter of 03/24/20 (from the past 48 hour(s))  Lactic acid, plasma     Status: Abnormal   Collection Time: 03/24/20  5:04 PM  Result Value Ref Range   Lactic Acid, Venous 2.3 (HH) 0.5 - 1.9 mmol/L    Comment: CRITICAL RESULT CALLED TO, READ BACK BY AND VERIFIED WITH: PAYAN,W RN @ 1754 03/24/20 LEONARD,A Performed at [facility], [place] [address] [place], [place] [address]   Comprehensive metabolic panel     Status: Abnormal   Collection Time: 03/24/20  5:04 PM  Result Value Ref Range   Sodium 155 (H) 135 - 145 mmol/L   Potassium 4.7 3.5 - 5.1 mmol/L   Chloride 121 (H) 98 - 111 mmol/L   CO2 21 (L) 22 - 32 mmol/L   Glucose, Bld 113 (H) 70 - 99 mg/dL    Comment: Glucose reference range applies only to samples taken after fasting for at least 8 hours.   BUN 36 (H) 8 - 23 mg/dL   Creatinine, Ser 1.19 0.61 - 1.24 mg/dL   Calcium 8.8 (L) 8.9 - 10.3 mg/dL   Total Protein 6.5 6.5 - 8.1 g/dL   Albumin 2.0 (L) 3.5 - 5.0 g/dL   AST 28 15 - 41 U/L   ALT 16 0 - 44 U/L   Alkaline Phosphatase 68 38 - 126 U/L   Total Bilirubin 1.0  0.3 - 1.2 mg/dL   GFR calc non Af Amer 57 (L) >60 mL/min   GFR calc Af Amer >60 >60 mL/min   Anion gap 13 5 - 15    Comment: Performed at [facility] [address] [place], [place] [address]  CBC WITH DIFFERENTIAL     Status: Abnormal   Collection Time: 03/24/20  5:04 PM  Result Value Ref Range   WBC 18.1 (H) 4.0 - 10.5 K/uL   RBC 3.50 (L) 4.22 - 5.81 MIL/uL   Hemoglobin 10.5 (L) 13.0 - 17.0 g/dL   HCT 34.5 (L)  39.0 - 52.0 %   MCV 98.6 80.0 - 100.0 fL   MCH 30.0 26.0 - 34.0 pg   MCHC 30.4 30.0 - 36.0 g/dL   RDW 16.1 (H) 11.5 - 15.5 %   Platelets 303 150 - 400 K/uL   nRBC 0.1 0.0 - 0.2 %   Neutrophils Relative % 93 %   Neutro Abs 16.8 (H) 1.7 - 7.7 K/uL   Lymphocytes Relative 5 %   Lymphs Abs 0.9 0.7 - 4.0 K/uL   Monocytes Relative 0 %   Monocytes Absolute 0.0 (L) 0.1 - 1.0 K/uL   Eosinophils Relative 0 %   Eosinophils Absolute 0.0 0.0 - 0.5 K/uL   Basophils Relative 0 %   Basophils Absolute 0.0 0.0 - 0.1 K/uL   WBC Morphology See Note     Comment: Mild Left Shift. 1 to 5% Metas and Myelos, Occ Pro Noted.   nRBC 0 0 /100 WBC   Myelocytes 1 %   Promyelocytes Relative 1 %   Abs Immature Granulocytes 0.40 (H) 0.00 - 0.07 K/uL   Polychromasia PRESENT     Comment: Performed at [facility] [address], [place], [place] [address]  APTT     Status: None   Collection Time: 03/24/20  5:04 PM  Result Value Ref Range   aPTT 28 24 - 36 seconds    Comment: Performed at [facility] [address], [place], [place] [address]  Protime-INR     Status: Abnormal   Collection Time: 03/24/20  5:04 PM  Result Value Ref Range   Prothrombin Time 17.2 (H) 11.4 - 15.2 seconds   INR 1.4 (H) 0.8 - 1.2    Comment: (NOTE) INR goal varies based on device and disease states. Performed at [facility], [place] [address] [place], [place] [address]   POCT I-Stat EG7     Status: Abnormal   Collection Time: 03/24/20  5:12 PM  Result Value Ref Range   pH, Ven 7.476 (H)  7.250 - 7.430   pCO2, Ven 29.4 (L) 44.0 - 60.0 mmHg   pO2, Ven 36.0 32.0 - 45.0 mmHg   Bicarbonate 21.7 20.0 - 28.0 mmol/L   TCO2 23 22 - 32 mmol/L   O2 Saturation 74.0 %   Acid-base deficit 1.0 0.0 - 2.0 mmol/L   Sodium 155 (H) 135 - 145 mmol/L   Potassium 4.5 3.5 - 5.1 mmol/L   Calcium, Ion 1.13 (L) 1.15 - 1.40 mmol/L   HCT 29.0 (L) 39.0 - 52.0 %   Hemoglobin 9.9 (L) 13.0 - 17.0 g/dL   Patient temperature HIDE    Sample type VENOUS    Comment NOTIFIED PHYSICIAN   Urinalysis, Routine w reflex microscopic     Status: Abnormal   Collection Time: 03/24/20  6:30 PM  Result Value Ref Range   Color, Urine BROWN (A) YELLOW    Comment: BIOCHEMICALS MAY BE AFFECTED BY COLOR   APPearance TURBID (A) CLEAR   Specific Gravity, Urine >1.030 (H) 1.005 - 1.030   pH 5.0 5.0 - 8.0   Glucose, UA 100 (A) NEGATIVE mg/dL   Hgb urine dipstick LARGE (A) NEGATIVE   Bilirubin Urine MODERATE (A) NEGATIVE   Ketones, ur 15 (A) NEGATIVE mg/dL   Protein, ur 100 (A) NEGATIVE mg/dL   Nitrite POSITIVE (A) NEGATIVE   Leukocytes,Ua MODERATE (A) NEGATIVE    Comment: Performed at [facility], [place] [address] [place], [place] [address]  Urinalysis, Microscopic (  reflex)     Status: Abnormal   Collection Time: 03/24/20  6:30 PM  Result Value Ref Range   RBC / HPF 0-5 0 - 5 RBC/hpf   WBC, UA >50 0 - 5 WBC/hpf   Bacteria, UA MANY (A) NONE SEEN   Squamous Epithelial / LPF 0-5 0 - 5    Comment: Performed at [facility], [place] [address], [place], [place] [address]  POC SARS Coronavirus 2 Ag-ED - Nasal Swab (BD Veritor Kit)     Status: None   Collection Time: 03/24/20  7:06 PM  Result Value Ref Range   SARS Coronavirus 2 Ag NEGATIVE NEGATIVE    Comment: (NOTE) SARS-CoV-2 antigen NOT DETECTED.  Negative results are presumptive.  Negative results do not preclude SARS-CoV-2 infection and should not be used as the sole basis for treatment or other patient management decisions, including infection  control  decisions, particularly in the presence of clinical signs and  symptoms consistent with COVID-19, or in those who have been in contact with the virus.  Negative results must be combined with clinical observations, patient history, and epidemiological information. The expected result is Negative. Fact Sheet for Patients: PodPark.tn Fact Sheet for Healthcare Providers: GiftContent.is This test is not yet approved or cleared by the [place] FDA and  has been authorized for detection and/or diagnosis of SARS-CoV-2 by FDA under an Emergency Use Authorization (EUA).  This EUA will remain in effect (meaning this test can be used) for the duration of  the COVID-19 de claration under Section 564(b)(1) of the Act, 21 U.S.C. section 360bbb-3(b)(1), unless the authorization is terminated or revoked sooner.    CT Head Wo Contrast  Result Date: 03/24/2020 CLINICAL DATA:  Increasing altered mental status EXAM: CT HEAD WITHOUT CONTRAST TECHNIQUE: Contiguous axial images were obtained from the base of the skull through the vertex without intravenous contrast. COMPARISON:  09/29/2019 FINDINGS: Brain: There is a geographic area of decreased attenuation identified in the left frontal parietal region best seen on image number 26 of series 3 which is new from the prior exam consistent with subacute ischemia in the distribution of the left middle cerebral artery. Scattered atrophic changes and chronic white matter ischemic change is seen. Changes of prior left temporal infarct are noted. No acute hemorrhage or space-occupying mass lesion is noted. Vascular: No hyperdense vessel or unexpected calcification. Skull: Normal. Negative for fracture or focal lesion. Sinuses/Orbits: No acute finding. Other: None. IMPRESSION: Area of new decreased attenuation in the distribution of the left middle cerebral artery as described consistent with acute to subacute  ischemia. Chronic atrophic and ischemic changes. Electronically Signed   By: Inez Catalina M.D.   On: 03/24/2020 19:50   DG Chest Port 1 View  Result Date: 03/24/2020 CLINICAL DATA:  85 year old male code sepsis. EXAM: PORTABLE CHEST 1 VIEW COMPARISON:  Chest CTA 09/29/2019 and earlier. FINDINGS: Portable AP semi upright view at 1715 hours. Mildly rotated to the right. Skin folds projecting over the chest. Lower lung volumes. Stable cardiomegaly and mediastinal contours. Calcified aortic atherosclerosis. Stable lung markings, no acute pulmonary opacity. No pneumothorax or pleural effusion identified. Negative visible bowel gas pattern. No acute osseous abnormality identified. IMPRESSION: 1.  No acute cardiopulmonary abnormality. 2.  Aortic Atherosclerosis (ICD10-I70.0). Electronically Signed   By: Genevie Ann M.D.   On: 03/24/2020 17:36    Assessment: 85 y.o. male with Alzheimer disease presenting with AMS and fever. Labs consistent with infection. CT head reveals a relatively large  area of new decreased attenuation in the left MCA territory, consistent with subacute ischemia.  1. Exam reveals findings most consistent with end-stage dementia in conjunction with worsened level of consciousness relative to baseline reported by wife. Difficult to assess for a focal motor abnormality due to his subacute left MCA territory stroke given his flexion contractures and AMS, but right wrist is flaccid and RLE withdraws more weakly to plantar stimulation than LLE.  2. In addition to the new infarction, CT head reveals chronic atrophic and ischemic changes. 3. Stroke Risk Factors - Atrial fibrillation, CAD and HTN 4. Initial clinical findings and lab results are most consistent with UTI and a PNA, which may be bacterial rather than viral. Has received Covid vaccines x 2 previously.  5. Decubitus ulcer 6. Overall impression is that this is not a meningitis. Of note, wife does not wish for patient to have an LP. If his  mentation does not improve by tomorrow with current antibiotic regimen, call Neurology for re-assessment.   Recommendations: 1. No further stroke work up. Further testing will not change management.  2. On ASA in the context of his a-fib. History of subdural hematoma, therefore unable to anticoagulate. Stroke team to consider addition of Plavix to his regimen.  3. Can hold Aricept until his sensorium clears.  4. Continue valproic acid. Most likely was for mood stabilization, but sudden withdrawal could precipitate a seizure.  5. DNR.  6. Management of UTI and PNA per primary team. Currently on Zosyn.    _0  signed: Dr. Kerney Elbe 03/24/2020, 8:06 PM

## 2020-03-24 NOTE — ED Notes (Signed)
Pt taken to MRI  

## 2020-03-24 NOTE — Progress Notes (Signed)
Pharmacy Antibiotic Note  Joseph Winters is a 85 y.o. male admitted on 03/24/2020 with sepsis, multiple possible sources including pna/UTI/diverticulitis received 1 dose ctx/azithromycin in ED.  Pharmacy has been consulted for zosyn dosing.  WBC 18, LA 2.2, tachy  Plan: Zosyn 3.375g IV every 8 hours (extended infusion) Monitor renal function, clinical progression and LOT     No data recorded.  Recent Labs  Lab 03/24/20 1704 03/24/20 2003  WBC 18.1*  --   CREATININE 1.19  --   LATICACIDVEN 2.3* 2.2*    CrCl cannot be calculated (Unknown ideal weight.).    Allergies  Allergen Reactions  . Memantine     Other reaction(s): Confusion (intolerance) Made mental status worse   Bertis Ruddy, PharmD Clinical Pharmacist ED Pharmacist Phone # [phone] 03/24/2020 9:18 PM

## 2020-03-24 NOTE — ED Provider Notes (Signed)
[place] EMERGENCY DEPARTMENT Provider Note   CSN: [id number] Arrival date & time: 03/24/20  1653     History Chief Complaint  Patient presents with  . Code Sepsis    Joseph Winters is a 85 y.o. male.  Level 5 caveat secondary to altered mental status.  Patient is brought in by EMS from [facility].  Code sepsis.  He normally is nonverbal and nonambulatory although reportedly is interactive.  He has been less interactive and more short of breath since last evening.  Initial sat by EMS was 83% on room air.  They placed him on a nonrebreather.  History of Alzheimer's history of aspiration.  The history is provided by the EMS personnel. The history is limited by the condition of the patient.  Shortness of Breath Severity:  Moderate Onset quality:  Gradual Duration:  24 hours Timing:  Constant Progression:  Worsening Chronicity:  New Relieved by:  None tried Worsened by:  Nothing Ineffective treatments:  None tried      Past Medical History:  Diagnosis Date  . Alzheimer disease   . Atrial fibrillation ([place])   . Coronary artery disease   . Dysrhythmia 2009   24hr holter-PVC'S  . Hypertension 08/28/2008   Echo EF 45-50%  . Myocardial infarction ([place]) 1993  . Subdural hemorrhage [facility])     Patient Active Problem List   Diagnosis Date Noted  . Chronic atrial fibrillation ([place]) 11/15/2014  . Subdural hematoma ([place]) 04/04/2014  . PAF (paroxysmal atrial fibrillation), new from 01/2014 rate controlled.  03/06/2014  . Old MI (myocardial infarction) 03/01/2014  . CAD (coronary artery disease) 03/01/2014  . HTN (hypertension) 03/01/2014  . Hyperlipidemia with target LDL less than 70 03/01/2014  . Closed head injury 01/21/2014    Past Surgical History:  Procedure Laterality Date  . CORONARY ANGIOPLASTY     initial inferior MI Laurie Repeat PTCA       No family history on file.  Social History   Tobacco Use  . Smoking status: Former  Smoker    Types: Cigarettes    Quit date: 12/11/1985    Years since quitting: 34.3  . Smokeless tobacco: Never Used  Substance Use Topics  . Alcohol use: Yes  . Drug use: No    Home Medications Prior to Admission medications   Medication Sig Start Date End Date Taking? Authorizing Provider  acetaminophen (TYLENOL) 500 MG tablet Take 1-2 tablets (500-1,000 mg total) by mouth every 6 (six) hours as needed for mild pain or moderate pain. 08/12/17   Duffy Bruce, MD  aspirin 81 MG tablet Take 81 mg by mouth at bedtime.     [provider]  atorvastatin (LIPITOR) 10 MG tablet Take 1 tablet (10 mg total) by mouth daily. Patient taking differently: Take 10 mg by mouth at bedtime.  09/27/17 03/14/18  Troy Sine, MD  citalopram (CELEXA) 10 MG tablet Take 10 mg by mouth every morning. 02/16/18   [provider]  donepezil (ARICEPT) 10 MG tablet Take 10 mg by mouth at bedtime.    [provider]  doxazosin (CARDURA) 1 MG tablet Take 1 tablet (1 mg total) by mouth daily. 09/19/17   Troy Sine, MD  isosorbide mononitrate (IMDUR) 60 MG 24 hr tablet Take 1 tablet (60 mg total) by mouth daily. 09/19/17   Troy Sine, MD  Melatonin 3 MG TBDP Take 1 tablet by mouth at bedtime.    [provider]  Metoprolol  Succinate 50 MG CS24 Take 25 mg by mouth daily. 09/19/17   Troy Sine, MD  mirtazapine (REMERON) 7.5 MG tablet Take 7.5 mg by mouth at bedtime. 03/12/18   [provider]  Multiple Vitamins-Minerals (OCUVITE ADULT FORMULA PO) Take 1 capsule by mouth daily.    [provider]  ramipril (ALTACE) 10 MG capsule Take 1 capsule (10 mg total) by mouth daily. 09/19/17   Troy Sine, MD  valproic acid (DEPAKENE) 250 MG capsule Take 250 mg by mouth 2 (two) times daily.    [provider]    Allergies    Memantine  Review of Systems   Review of Systems  Unable to perform ROS: Mental status change  Respiratory: Positive for  shortness of breath.     Physical Exam Updated Vital Signs BP (!) 134/51   Pulse 79   Resp (!) 28   Ht 5\' 8"  (1.727 m)   Wt 54 kg   SpO2 (!) 78%   BMI 18.09 kg/m   Physical Exam Vitals and nursing note reviewed.  Constitutional:      Appearance: He is well-developed. He is ill-appearing.  HENT:     Head: Normocephalic and atraumatic.  Eyes:     Conjunctiva/sclera: Conjunctivae normal.  Cardiovascular:     Rate and Rhythm: Tachycardia present. Rhythm irregular.     Pulses: Normal pulses.     Heart sounds: No murmur.  Pulmonary:     Effort: Tachypnea and accessory muscle usage present. No respiratory distress.     Breath sounds: Examination of the right-upper field reveals decreased breath sounds. Examination of the right-middle field reveals decreased breath sounds. Examination of the right-lower field reveals decreased breath sounds. Decreased breath sounds present.  Abdominal:     Palpations: Abdomen is soft.     Tenderness: There is no abdominal tenderness.  Musculoskeletal:        General: No swelling or tenderness.     Cervical back: Neck supple.     Comments: He has Contractures of both his lower legs.  Skin:    General: Skin is warm and dry.     Capillary Refill: Capillary refill takes less than 2 seconds.  Neurological:     Mental Status: He is lethargic.     Comments: Patient will withdraw from pain.  Not following any commands.  Will not open his eyes.  Nonverbal.     ED Results / Procedures / Treatments   Labs (all labs ordered are listed, but only abnormal results are displayed) Labs Reviewed  LACTIC ACID, PLASMA - Abnormal; Notable for the following components:      Result Value   Lactic Acid, Venous 2.3 (*)    All other components within normal limits  LACTIC ACID, PLASMA - Abnormal; Notable for the following components:   Lactic Acid, Venous 2.2 (*)    All other components within normal limits  COMPREHENSIVE METABOLIC PANEL - Abnormal; Notable for  the following components:   Sodium 155 (*)    Chloride 121 (*)    CO2 21 (*)    Glucose, Bld 113 (*)    BUN 36 (*)    Calcium 8.8 (*)    Albumin 2.0 (*)    GFR calc non Af Amer 57 (*)    All other components within normal limits  CBC WITH DIFFERENTIAL/PLATELET - Abnormal; Notable for the following components:   WBC 18.1 (*)    RBC 3.50 (*)    Hemoglobin 10.5 (*)  HCT 34.5 (*)    RDW 16.1 (*)    Neutro Abs 16.8 (*)    Monocytes Absolute 0.0 (*)    Abs Immature Granulocytes 0.40 (*)    All other components within normal limits  PROTIME-INR - Abnormal; Notable for the following components:   Prothrombin Time 17.2 (*)    INR 1.4 (*)    All other components within normal limits  URINALYSIS, ROUTINE W REFLEX MICROSCOPIC - Abnormal; Notable for the following components:   Color, Urine BROWN (*)    APPearance TURBID (*)    Specific Gravity, Urine >1.030 (*)    Glucose, UA 100 (*)    Hgb urine dipstick LARGE (*)    Bilirubin Urine MODERATE (*)    Ketones, ur 15 (*)    Protein, ur 100 (*)    Nitrite POSITIVE (*)    Leukocytes,Ua MODERATE (*)    All other components within normal limits  URINALYSIS, MICROSCOPIC (REFLEX) - Abnormal; Notable for the following components:   Bacteria, UA MANY (*)    All other components within normal limits  POCT I-STAT EG7 - Abnormal; Notable for the following components:   pH, Ven 7.476 (*)    pCO2, Ven 29.4 (*)    Sodium 155 (*)    Calcium, Ion 1.13 (*)    HCT 29.0 (*)    Hemoglobin 9.9 (*)    All other components within normal limits  RESPIRATORY PANEL BY RT PCR (FLU A&B, COVID)  CULTURE, BLOOD (ROUTINE X 2)  CULTURE, BLOOD (ROUTINE X 2)  URINE CULTURE  APTT  HEMOGLOBIN A1C  LIPID PANEL  I-STAT VENOUS BLOOD GAS, ED  POC SARS CORONAVIRUS 2 AG -  ED    EKG EKG Interpretation  Date/Time:  Wednesday March 24 2020 16:54:39 EDT Ventricular Rate:  119 PR Interval:    QRS Duration: 117 QT Interval:  357 QTC Calculation: 486 R  Axis:   -5 Text Interpretation: Atrial fibrillation Paired ventricular premature complexes Incomplete left bundle branch block Rate faster since prior 12/18 Confirmed by Aletta Edouard [phone]) on 03/24/2020 5:01:44 PM   Radiology CT Head Wo Contrast  Result Date: 03/24/2020 CLINICAL DATA:  Increasing altered mental status EXAM: CT HEAD WITHOUT CONTRAST TECHNIQUE: Contiguous axial images were obtained from the base of the skull through the vertex without intravenous contrast. COMPARISON:  09/29/2019 FINDINGS: Brain: There is a geographic area of decreased attenuation identified in the left frontal parietal region best seen on image number 26 of series 3 which is new from the prior exam consistent with subacute ischemia in the distribution of the left middle cerebral artery. Scattered atrophic changes and chronic white matter ischemic change is seen. Changes of prior left temporal infarct are noted. No acute hemorrhage or space-occupying mass lesion is noted. Vascular: No hyperdense vessel or unexpected calcification. Skull: Normal. Negative for fracture or focal lesion. Sinuses/Orbits: No acute finding. Other: None. IMPRESSION: Area of new decreased attenuation in the distribution of the left middle cerebral artery as described consistent with acute to subacute ischemia. Chronic atrophic and ischemic changes. Electronically Signed   By: Inez Catalina M.D.   On: 03/24/2020 19:50   CT Angio Chest PE W/Cm &/Or Wo Cm  Result Date: 03/24/2020 CLINICAL DATA:  Shortness of breath EXAM: CT ANGIOGRAPHY CHEST WITH CONTRAST TECHNIQUE: Multidetector CT imaging of the chest was performed using the standard protocol during bolus administration of intravenous contrast. Multiplanar CT image reconstructions and MIPs were obtained to evaluate the vascular anatomy. CONTRAST:  137mL  OMNIPAQUE IOHEXOL 350 MG/ML SOLN COMPARISON:  None. FINDINGS: Cardiovascular: Satisfactory opacification of the pulmonary arteries to the  segmental level. No evidence of pulmonary embolism. Cardiomegaly with biatrial enlargement. No pericardial effusion. Coronary artery and aortic calcification. Mediastinum/Nodes: Likely reactive mildly prominent right suprahilar node. Thyroid is unremarkable. Esophagus is unremarkable. Lungs/Pleura: Right basilar consolidation. Additional patchy atelectasis/consolidation. Mucous plugging noted. No pleural effusion or pneumothorax. Upper Abdomen: Dictated separately Musculoskeletal: Degenerative changes of the thoracic spine. Review of the MIP images confirms the above findings. IMPRESSION: No evidence of acute pulmonary embolism. Right lower lobe pneumonia. Additional patchy atelectasis/consolidation. Coronary artery calcification. Aortic Atherosclerosis (ICD10-I70.0). Electronically Signed   By: Macy Mis M.D.   On: 03/24/2020 20:07   CT Abdomen Pelvis W Contrast  Result Date: 03/24/2020 CLINICAL DATA:  Abdominal abscess EXAM: CT ABDOMEN AND PELVIS WITH CONTRAST TECHNIQUE: Multidetector CT imaging of the abdomen and pelvis was performed using the standard protocol following bolus administration of intravenous contrast. CONTRAST:  175mL OMNIPAQUE IOHEXOL 350 MG/ML SOLN COMPARISON:  None. FINDINGS: Lower chest: The visualized heart size within normal limits. No pericardial fluid/thickening. No hiatal hernia. Patchy airspace opacity seen at the posterior right lung base. Hepatobiliary: The liver is normal in density without focal abnormality.The main portal vein is patent. No evidence of calcified gallstones, gallbladder wall thickening or biliary dilatation. Pancreas: Unremarkable. No pancreatic ductal dilatation or surrounding inflammatory changes. Spleen: Normal in size without focal abnormality. Adrenals/Urinary Tract: Both adrenal glands appear normal. The kidneys and collecting system appear normal without evidence of urinary tract calculus or hydronephrosis. There is diffuse bladder wall thickening  present. Stomach/Bowel: The stomach, small bowel, are normal in appearance. Scattered colonic diverticula are noted. There also appears to be diffuse wall thickening seen around the distal sigmoid colon and rectum with presacral edema and mild fat stranding changes. No loculated there is also non loculated fluid seen posterior to the sigmoid rectal junction, series 5, image 60. No free air however is noted. Vascular/Lymphatic: There are no enlarged mesenteric, retroperitoneal, or pelvic lymph nodes. Scattered aortic atherosclerotic calcifications are seen without aneurysmal dilatation. Reproductive: The prostate gland is heterogeneous and slightly enlarged. Other: No evidence of abdominal wall mass or hernia. Musculoskeletal: No acute or significant osseous findings. IMPRESSION: 1. Findings which could be suggestive of distal sigmoid colonic diverticulitis and proctitis. There is surrounding non loculated fluid/phlegmon seen adjacent to the distal sigmoid colon. No definite loculated fluid collection or free air is seen. 2. Partially decompressed bladder with diffuse wall thickening which could be due to chronic cystitis. 3.  Aortic Atherosclerosis (ICD10-I70.0). 4. Partially visualized patchy airspace opacity at the posterior right lung base, likely due to aspiration/pneumonia. Electronically Signed   By: Prudencio Pair M.D.   On: 03/24/2020 20:11   DG Chest Port 1 View  Result Date: 03/24/2020 CLINICAL DATA:  86 year old male code sepsis. EXAM: PORTABLE CHEST 1 VIEW COMPARISON:  Chest CTA 09/29/2019 and earlier. FINDINGS: Portable AP semi upright view at 1715 hours. Mildly rotated to the right. Skin folds projecting over the chest. Lower lung volumes. Stable cardiomegaly and mediastinal contours. Calcified aortic atherosclerosis. Stable lung markings, no acute pulmonary opacity. No pneumothorax or pleural effusion identified. Negative visible bowel gas pattern. No acute osseous abnormality identified.  IMPRESSION: 1.  No acute cardiopulmonary abnormality. 2.  Aortic Atherosclerosis (ICD10-I70.0). Electronically Signed   By: Genevie Ann M.D.   On: 03/24/2020 17:36    Procedures .Critical Care Performed by: Hayden Rasmussen, MD Authorized by: Hayden Rasmussen, MD  Critical care provider statement:    Critical care time (minutes):  45   Critical care time was exclusive of:  Separately billable procedures and treating other patients   Critical care was necessary to treat or prevent imminent or life-threatening deterioration of the following conditions:  Sepsis   Critical care was time spent personally by me on the following activities:  Discussions with consultants, evaluation of patient's response to treatment, examination of patient, ordering and performing treatments and interventions, ordering and review of laboratory studies, ordering and review of radiographic studies, pulse oximetry, re-evaluation of patient's condition, obtaining history from patient or surrogate, review of old charts and development of treatment plan with patient or surrogate   I assumed direction of critical care for this patient from another provider in my specialty: no     (including critical care time)  Medications Ordered in ED Medications   stroke: mapping our early stages of recovery book (has no administration in time range)  acetaminophen (TYLENOL) tablet 650 mg (has no administration in time range)    Or  acetaminophen (TYLENOL) 160 MG/5ML solution 650 mg (has no administration in time range)    Or  acetaminophen (TYLENOL) suppository 650 mg (has no administration in time range)  senna-docusate (Senokot-S) tablet 1 tablet (has no administration in time range)  dextrose 5 % solution ( Intravenous New Bag/Given 03/24/20 2147)  piperacillin-tazobactam (ZOSYN) IVPB 3.375 g (3.375 g Intravenous New Bag/Given 03/24/20 2151)  metoprolol tartrate (LOPRESSOR) injection 5 mg (has no administration in time range)   sodium chloride 0.9 % bolus 1,000 mL (0 mLs Intravenous Stopped 03/24/20 1900)    And  sodium chloride 0.9 % bolus 1,000 mL (0 mLs Intravenous Stopped 03/24/20 2158)    And  sodium chloride 0.9 % bolus 250 mL (0 mLs Intravenous Stopped 03/24/20 1900)  acetaminophen (TYLENOL) suppository 650 mg (650 mg Rectal Given 03/24/20 1809)  iohexol (OMNIPAQUE) 350 MG/ML injection 100 mL (100 mLs Intravenous Contrast Given 03/24/20 1958)    ED Course  I have reviewed the triage vital signs and the nursing notes.  Pertinent labs & imaging results that were available during my care of the patient were reviewed by me and considered in my medical decision making (see chart for details).  Clinical Course as of Mar 24 2256  Wed Mar 24, 2020  1730 Patient is DNR/DNI.   [MB]  1754 Lactate elevated to 2.3.  Chest x-ray interpreted by me as rotation no gross infiltrates.  White blood cell count elevated at 18.  Hemoglobin low at 10.5.   [MB]  [id number] Chemistry showing a markedly elevated sodium of 155.  BUN and creatinine also elevated.   [MB]  [id number] Reevaluated patient.  He remains tachypneic on a nonrebreather but his pressure is okay.  The nurse just cathetered for urine and it looked infected.  Radiology read his chest x-ray as no acute infiltrates.  This makes the urine likely more source.  Ceftriaxone and Zithromax are already ordered for possible aspiration.  This should be reasonable coverage for his urine for now.   [MB]  1958 Patient's wife is here now and I updated her on current results.  Explained that he would need to be admitted to the hospital for management of his symptoms and she is okay with that.   [MB]  2003 Discussed with Dr. Cheral Marker from the neurology service who will evaluate the patient regarding his acute to subacute infarct on head CT.   [MB]  2035 Discussed with  Dr. Posey Pronto from [facility] hospitalist will evaluate the patient for admission   [MB]    Clinical Course User Index [MB] Hayden Rasmussen, MD   MDM Rules/Calculators/A&P                     This patient brought in for evaluation of hypoxia fever and decreased mental status; this involves an extensive number of treatment Options and is a complaint that carries with it a high risk of complications and Morbidity. The differential includes stroke, aspiration, sepsis, metabolic derangement, anemia, pneumonia  I ordered, reviewed and interpreted labs, which included patient has an elevated white count and his lactate is also elevated.  Other labs showing a sodium abnormally high at 155.Some element of BUN creatinine also elevated.  Urinalysis grossly positive I ordered medication IV fluids acetaminophen and IV antibiotics I ordered imaging studies which included right lower lobe which looks suspicious for pneumonia.  He also had a CAT scan of his head, chest and abdomen and I independently    visualized and interpreted imaging which showed right lower lobe pneumonia, possible signs of diverticulitis, and a acute to subacute stroke. Additional history obtained from wife Previous records obtained and reviewed in epic I consulted [facility] hospitalist Dr. Posey Pronto and discussed lab and imaging findings  Critical Interventions: IV fluids and antibiotics for sepsis  After the interventions stated above, I reevaluated the patient and found tachycardia improved although mental status is still off.  Will need admission to the hospital for further management.   Final Clinical Impression(s) / ED Diagnoses Final diagnoses:  Acute CVA (cerebrovascular accident) ([place])  Altered mental status, unspecified altered mental status type  Sepsis, due to unspecified organism, unspecified whether acute organ dysfunction present [facility])  Pneumonia of right lower lobe due to infectious organism  Acute diverticulitis    Rx / DC Orders ED Discharge Orders    None       Hayden Rasmussen, MD 03/24/20 2311

## 2020-03-24 NOTE — ED Notes (Signed)
Wife, Remo Lipps would like an update [phone]

## 2020-03-24 NOTE — ED Notes (Signed)
MRI canceled. Pt in room

## 2020-03-24 NOTE — ED Triage Notes (Signed)
Pt here via ems from [facility]. Code sepsis. Pt had low O2 sat 83 on RA. Was recently in hospital for aspiration pneumonia. Pt responds to pain only. Hx of alzheimer's. Pt had tachypnea and tachycardia. Afib at baseline and  Takes metoprolol.  EMS reports absent lung sounds on the left.

## 2020-03-25 DIAGNOSIS — Z7189 Other specified counseling: Secondary | ICD-10-CM | POA: Diagnosis not present

## 2020-03-25 DIAGNOSIS — I2510 Atherosclerotic heart disease of native coronary artery without angina pectoris: Secondary | ICD-10-CM | POA: Diagnosis not present

## 2020-03-25 DIAGNOSIS — I482 Chronic atrial fibrillation, unspecified: Secondary | ICD-10-CM | POA: Diagnosis not present

## 2020-03-25 DIAGNOSIS — F0280 Dementia in other diseases classified elsewhere without behavioral disturbance: Secondary | ICD-10-CM

## 2020-03-25 DIAGNOSIS — J189 Pneumonia, unspecified organism: Secondary | ICD-10-CM | POA: Diagnosis not present

## 2020-03-25 DIAGNOSIS — G301 Alzheimer's disease with late onset: Secondary | ICD-10-CM | POA: Diagnosis not present

## 2020-03-25 DIAGNOSIS — L8990 Pressure ulcer of unspecified site, unspecified stage: Secondary | ICD-10-CM | POA: Insufficient documentation

## 2020-03-25 DIAGNOSIS — Z515 Encounter for palliative care: Secondary | ICD-10-CM | POA: Diagnosis not present

## 2020-03-25 DIAGNOSIS — I639 Cerebral infarction, unspecified: Secondary | ICD-10-CM | POA: Diagnosis not present

## 2020-03-25 LAB — LIPID PANEL
Cholesterol: 105 mg/dL (ref 0–200)
HDL: 37 mg/dL — ABNORMAL LOW (ref >40)
LDL Cholesterol: 53 mg/dL (ref 0–99)
Total CHOL/HDL Ratio: 2.8 RATIO
Triglycerides: 76 mg/dL (ref <150)
VLDL: 15 mg/dL (ref 0–40)

## 2020-03-25 LAB — MRSA PCR SCREENING: MRSA by PCR: POSITIVE — AB

## 2020-03-25 LAB — HEMOGLOBIN A1C
Hgb A1c MFr Bld: 5.1 % (ref 4.8–5.6)
Mean Plasma Glucose: 99.67 mg/dL

## 2020-03-25 MED ORDER — ONDANSETRON HCL 4 MG/2ML IJ SOLN: 4.0000 mg | Freq: Four times a day (QID) | INTRAMUSCULAR | Status: DC | PRN

## 2020-03-25 MED ORDER — BIOTENE DRY MOUTH MT LIQD: 15.0000 mL | OROMUCOSAL | Status: DC | PRN

## 2020-03-25 MED ORDER — LORAZEPAM 2 MG/ML IJ SOLN: 1.0000 mg | INTRAMUSCULAR | Status: DC | PRN

## 2020-03-25 MED ORDER — GLYCOPYRROLATE 0.2 MG/ML IJ SOLN
0.2000 mg | Freq: Three times a day (TID) | INTRAMUSCULAR | Status: DC
  Administered 2020-03-25 – 2020-03-26 (×3): 0.2 mg via INTRAVENOUS
  Filled 2020-03-25 (×3): qty 1

## 2020-03-25 MED ORDER — HALOPERIDOL 0.5 MG PO TABS
0.5000 mg | ORAL_TABLET | ORAL | Status: DC | PRN
  Filled 2020-03-25: qty 1

## 2020-03-25 MED ORDER — HALOPERIDOL LACTATE 2 MG/ML PO CONC
0.5000 mg | ORAL | Status: DC | PRN
  Filled 2020-03-25: qty 0.3

## 2020-03-25 MED ORDER — ONDANSETRON 4 MG PO TBDP: 4.0000 mg | ORAL_TABLET | Freq: Four times a day (QID) | ORAL | Status: DC | PRN

## 2020-03-25 MED ORDER — HALOPERIDOL LACTATE 5 MG/ML IJ SOLN: 0.5000 mg | INTRAMUSCULAR | Status: DC | PRN

## 2020-03-25 MED ORDER — MUPIROCIN 2 % EX OINT
1.0000 "application " | TOPICAL_OINTMENT | Freq: Two times a day (BID) | CUTANEOUS | Status: DC
  Administered 2020-03-25 – 2020-03-26 (×3): 1 via NASAL
  Filled 2020-03-25 (×2): qty 22

## 2020-03-25 MED ORDER — POLYVINYL ALCOHOL 1.4 % OP SOLN
1.0000 [drp] | Freq: Four times a day (QID) | OPHTHALMIC | Status: DC | PRN
  Filled 2020-03-25: qty 15

## 2020-03-25 MED ORDER — CHLORHEXIDINE GLUCONATE CLOTH 2 % EX PADS
6.0000 | MEDICATED_PAD | Freq: Every day | CUTANEOUS | Status: DC
  Administered 2020-03-25 – 2020-03-26 (×2): 6 via TOPICAL

## 2020-03-25 MED ORDER — MORPHINE SULFATE (PF) 2 MG/ML IV SOLN
1.0000 mg | INTRAVENOUS | Status: DC | PRN
  Administered 2020-03-25: 2 mg via INTRAVENOUS
  Administered 2020-03-26: 1 mg via INTRAVENOUS
  Administered 2020-03-26: 08:00:00 2 mg via INTRAVENOUS
  Filled 2020-03-25 (×3): qty 1

## 2020-03-25 NOTE — Consult Note (Signed)
Consultation Note Date: 03/25/2020   Patient Name: Joseph Winters  DOB: [birth year]  MRN: [id number]  Age / Sex: 85 y.o., male  PCP: Lavone Orn, MD Referring Physician: Allie Bossier, MD  Reason for Consultation: Establishing goals of care and Hospice Evaluation  HPI/Patient Profile: 85 y.o. male  with past medical history of Alzheimer's dementia, atrial fibrillation, CAD, HTN, CHF, SDH admitted on 03/24/2020 with shortness of breath and worsening mental status and found to have stroke along with concern for aspiration pneumonia and UTI.   Clinical Assessment and Goals of Care: I have met with wife at bedside. Joseph Winters appears more comfortable at this time and is resting. Discussed transition to comfort care and she agrees that this is in his best interest and she is interested in pursuing [facility] if bed available. She worked as Estate agent in [employer] for [employer] for many years and has always held there program in high esteem. Discussed that he may decline in hospital and could die here but either way we will ensure he is comfortable. Encouraged her to reach out to family/friends that will want to visit with him and reviewed visitation policy for patient's at end of life. Comfort orders and prn medications placed.    Primary Decision Maker NEXT OF KIN wife at bedside    SUMMARY OF RECOMMENDATIONS   - Comfort care - Hopeful for transition to [place] Planning:  DNR   Symptom Management:   Comfort medications added as needed.   Palliative Prophylaxis:   Frequent Pain Assessment, Oral Care and Turn Reposition  Additional Recommendations (Limitations, Scope, Preferences):  Full Comfort Care  Psycho-social/Spiritual:   Desire for further Chaplaincy support:no - educated wife on chaplain availability  Additional Recommendations: Education on Hospice and  Grief/Bereavement Support  Prognosis:   Hours - Days  Discharge Planning: Hospice facility (or possible hospital death if he declines while awaiting bed)     Primary Diagnoses: Present on Admission: . Sepsis due to pneumonia ([place]) . Chronic atrial fibrillation ([place]) . CAD (coronary artery disease)   I have reviewed the medical record, interviewed the patient and family, and examined the patient. The following aspects are pertinent.  Past Medical History:  Diagnosis Date  . Alzheimer disease (Joseph Winters)   . Atrial fibrillation (Joseph Winters)   . Coronary artery disease   . Dysrhythmia 2009   24hr holter-PVC'S  . Hypertension 08/28/2008   Echo EF 45-50%  . Myocardial infarction (Joseph Winters) 1993  . Subdural hemorrhage ([facility])    Social History   Socioeconomic History  . Marital status: Married    Spouse name: Not on file  . Number of children: Not on file  . Years of education: Not on file  . Highest education level: Not on file  Occupational History  . Not on file  Tobacco Use  . Smoking status: Former Smoker    Types: Cigarettes    Quit date: 12/11/1985    Years since quitting: 34.3  . Smokeless tobacco: Never  Used  Substance and Sexual Activity  . Alcohol use: Yes  . Drug use: No  . Sexual activity: Not on file  Other Topics Concern  . Not on file  Social History Narrative  . Not on file   Social Determinants of Health   Financial Resource Strain:   . Difficulty of Paying Living Expenses:   Food Insecurity:   . Worried About Charity fundraiser in the Last Year:   . Arboriculturist in the Last Year:   Transportation Needs:   . Film/video editor (Medical):   Marland Kitchen Lack of Transportation (Non-Medical):   Physical Activity:   . Days of Exercise per Week:   . Minutes of Exercise per Session:   Stress:   . Feeling of Stress :   Social Connections:   . Frequency of Communication with Friends and Family:   . Frequency of Social Gatherings with Friends and Family:   .  Attends Religious Services:   . Active Member of Clubs or Organizations:   . Attends Archivist Meetings:   Marland Kitchen Marital Status:    Family History  Problem Relation Age of Onset  . Stroke Mother   . Heart disease Father    Scheduled Meds: .  stroke: mapping our early stages of recovery book   Does not apply Once  . Chlorhexidine Gluconate Cloth  6 each Topical Q0600  . glycopyrrolate  0.2 mg Intravenous TID  . mupirocin ointment  1 application Nasal BID   Continuous Infusions: . valproate sodium 95 mg (03/25/20 0839)   PRN Meds:.acetaminophen **OR** acetaminophen (TYLENOL) oral liquid 160 mg/5 mL **OR** acetaminophen, antiseptic oral rinse, haloperidol **OR** haloperidol **OR** haloperidol lactate, LORazepam, metoprolol tartrate, morphine injection, ondansetron **OR** ondansetron (ZOFRAN) IV, polyvinyl alcohol Allergies  Allergen Reactions  . Memantine     Other reaction(s): Confusion (intolerance) Made mental status worse   Review of Systems  Unable to perform ROS: Acuity of condition    Physical Exam Vitals and nursing note reviewed.  Constitutional:      General: He is not in acute distress.    Appearance: He is cachectic. He is ill-appearing.  Cardiovascular:     Rate and Rhythm: Normal rate.     Comments: Episodes of VT Pulmonary:     Effort: Pulmonary effort is normal. No tachypnea, accessory muscle usage or respiratory distress.     Comments: Respiratory status improved when turned on left side per RN Abdominal:     Palpations: Abdomen is soft.  Skin:    Comments: R foot mottling and cool to touch  Neurological:     Comments: Does not respond or follow commands; moans with movement     Vital Signs: BP 92/64   Pulse 100   Temp 99 F (37.2 C) (Rectal)   Resp (!) 22   Ht '5\' 8"'  (1.727 m)   Wt 54 kg   SpO2 97%   BMI 18.09 kg/m  Pain Scale: PAINAD   Pain Score: 0-No pain   SpO2: SpO2: 97 % O2 Device:SpO2: 97 % O2 Flow Rate: .O2 Flow Rate  (L/min): 2 L/min  IO: Intake/output summary:   Intake/Output Summary (Last 24 hours) at 03/25/2020 1316 Last data filed at 03/24/2020 1900 Gross per 24 hour  Intake 1350 ml  Output --  Net 1350 ml    LBM: Last BM Date: 03/25/20 Baseline Weight: Weight: 54 kg Most recent weight: Weight: 54 kg     Palliative Assessment/Data:  Time In: 1215 Time Out: 1315 Time Total: 60 min Greater than 50%  of this time was spent counseling and coordinating care related to the above assessment and plan.  Signed by: Vinie Sill, NP Palliative Medicine Team Pager # [phone] (M-F 8a-5p) Team Phone # [phone] (Nights/Weekends)

## 2020-03-25 NOTE — Social Work (Signed)
CSW unable to do sbirt due to pt being disoriented x4. CSW will attempt to complete at more appropriate time.   Emeterio Reeve, Latanya Presser, [place] Social Worker [phone]

## 2020-03-25 NOTE — Progress Notes (Signed)
SLP Cancellation Note  Patient Details Name: Joseph Winters MRN: [id number] DOB: 07/23/1935   Cancelled treatment:       Reason Eval/Treat Not Completed: Other (comment)(Pt's family has decided on comfort care and SLP orders have been cancelled by medical team for this reason.)  Lakima Dona I. Hardin Negus, [place], [place] Office number [phone] Pager [place] 03/25/2020, 1:29 PM

## 2020-03-25 NOTE — Progress Notes (Signed)
PROGRESS NOTE    Joseph Winters  [id number] DOB: 01/06/1935 DOA: 03/24/2020 PCP: Lavone Orn, MD     Brief Narrative:  Joseph Winters is a 85 y.o. male with medical history significant for atrial fibrillation on anticoagulation, history of subdural hemorrhage, CAD on aspirin 81 mg daily, hypertension, and advanced Alzheimer's dementia who presents to the ED for evaluation of increased work of breathing and change in mentation.  Patient unable to provide any significant history therefore entirety of history is obtained from wife at bedside, EDP, and chart review.  Per wife, patient has advanced Alzheimer's dementia.  At his baseline he is nonverbal and nonambulatory.  He does not really follow commands but will track with his eyes and move his upper extremities with some purpose.  Patient is currently resident at [facility].  Staff noted that he appeared to be having increased work of breathing and no change in level of interaction.  EMS were called and per ED documentation he was noted to have O2 saturation of 83% on room air with tachypnea and tachycardia.  He was brought to the ED for further evaluation and management.  ED Course:  Initial vitals showed BP 128/70, pulse 118, RR 26, SPO2 93% on 15 L NRB.  Patient had fever per EDP.  Labs are notable for sodium 155, potassium 4.7, bicarb 21, BUN 36, creatinine 1.19, WBC 18.1, hemoglobin 10.5, platelets 303,000.  Lactic acid 2.3.  VBG showed pH 7.476, PCO2 29.4, PO2 36.  Urinalysis showed positive nitrites, moderate leukocytes, 0-5 RBCs, >50 WBCs, many bacteria microscopy.  Blood and urine cultures were obtained and pending.  POC SARS-CoV-2 antigen is negative.  SARS-CoV-2 PCR respiratory panel is obtained and pending.  Portable chest x-ray was negative for focal consolidation, edema, effusion, or pneumothorax.  CT head without contrast showed acute to subacute left middle cerebral artery distribution ischemia.  CTA  chest PE study was negative for evidence of acute PE.  Right lower lobe pneumonia with additional patchy atelectasis/consolidation noted.  CT abdomen/pelvis with contrast showed changes suggestive of distal sigmoid colonic diverticulitis and proctitis.  Patient was given 2.25 L normal saline, IV ceftriaxone and azithromycin.  Neurology were consulted and seen the patient.  The hospitalist service was consulted for further evaluation management.   Subjective: A/O x0.  Patient grimaces to painful stimuli, otherwise unresponsive.   Assessment & Plan:   Principal Problem:   Sepsis due to pneumonia [facility]) Active Problems:   CAD (coronary artery disease)   Chronic atrial fibrillation (HCC)   Alzheimer disease ([place])   Sigmoid diverticulitis   Proctitis   Sepsis secondary to UTI ([place])   Hypernatremia  Acute respiratory failure with hypoxia -Titrate O2 to maintain SPO2> 88% ([facility]) -Wife wishing to discuss hospice.  Palliative care has been contacted  Sepsis due to right lower lobe pneumonia, UTI, sigmoid diverticulitis/proctitis: Suspect aspiration pneumonia as primary source but also has evidence of UTI and CT imaging suggests sigmoid diverticulitis and proctitis.  Significant amount of mucus suctioned by respiratory therapy with improvement in oxygenation. -Start IV Zosyn empirically for aspiration pneumonia/UTI/diverticulitis/proctitis coverage -Follow blood and urine cultures -Continue naso/tracheal suctioning as needed -Continue aspiration precautions, SLP eval -Keep n.p.o. for now  Acute to subacute left MCA ischemia: As seen on CT imaging.  Neurology has evaluated and recommended no further stroke work-up at this time as it would not change management.  He has a history of subdural hemorrhage therefore not on anticoagulation in setting of known atrial  fibrillation. -Resume aspirin 81 mg daily and atorvastatin when able to reliably take oral medications -(COMFORT  CARE)  Hypernatremia: In setting of dehydration from poor oral intake.  Start normal saline and start D5W @ 100 mL/hr overnight and repeat labs in the morning. -4/15 discussed plan of care with wife DC'd all lab draws -([facility])  Chronic atrial fibrillation: -Not on anticoagulation due to history of subdural hemorrhage.  Has been on aspirin 81 mg alone.  Remains in atrial fibrillation with borderline tachycardia.  On metoprolol as an outpatient. -Continue IV metoprolol as needed while n.p.o. -Resume aspirin when able  Hypertension: -Metoprolol, Imdur, ramipril on hold while n.p.o.  CAD: -Aspirin, statin, metoprolol, Imdur on hold while NPO.  End-stage Alzheimer's dementia: Nonverbal, nonambulatory, and minimally interactive at baseline.  He has acute worsening in setting of sepsis and CVA.  Neurology recommending continuing valproic acid (likely for mood stabilization) as he is at risk for withdrawal seizure. -Aricept on hold -Depakote converted to IV valproic acid with pharmacy assistance while NPO. -Delirium precautions -Patient is DNR/DNI     DVT prophylaxis: SCD Code Status: ([facility]ORT [facility]ARE) Family Communication: 4/15 discussed plan of care with wife answered all questions. Disposition Plan: Wife interested in residential hospice will await palliative care recommendations. 1.  Where the patient is from 2.  Anticipated d/c place. 3.  Barriers to d/c (COM[facility]).  Residential hospice?   Consultants:  Palliative care  Procedures/Significant Events:  4/14 CT head  Wo contrast; geographic area of decreased attenuation identified in the left frontal parietal region which is new from the prior exam consistent with subacute ischemia in the distribution of the left middle cerebral artery.  -Scattered atrophic changes and chronic white matter ischemic change is seen.  -Changes of prior left temporal infarct are noted.    I have personally reviewed and interpreted  all radiology studies and my findings are as above.  VENTILATOR SETTINGS:    Cultures   Antimicrobials: Anti-infectives (From admission, onward)   Start     Stop   03/24/20 2200  piperacillin-tazobactam (ZOSYN) IVPB 3.375 g         03/24/20 1745  cefTRIAXone (ROCEPHIN) 2 g in sodium chloride 0.9 % 100 mL IVPB  Status:  Discontinued     03/24/20 2111   03/24/20 1745  azithromycin (ZITHROMAX) 500 mg in sodium chloride 0.9 % 250 mL IVPB  Status:  Discontinued     03/24/20 2111       Devices    LINES / TUBES:      Continuous Infusions: . piperacillin-tazobactam (ZOSYN)  IV 3.375 g (03/25/20 0606)  . valproate sodium 95 mg (03/25/20 0839)     Objective: Vitals:   03/25/20 0800 03/25/20 0900 03/25/20 1000 03/25/20 1048  BP: (!) 89/50 (!) 107/54 104/60 92/64  Pulse: (!) 103 (!) 112 (!) 113 100  Resp: (!) 27 (!) 28 (!) 27 (!) 22  Temp:    99 F (37.2 C)  TempSrc:    Rectal  SpO2: 96% 98% 93% 97%  Weight:      Height:        Intake/Output Summary (Last 24 hours) at 03/25/2020 1109 Last data filed at 03/24/2020 1900 Gross per 24 hour  Intake 1350 ml  Output --  Net 1350 ml   Filed Weights   03/24/20 2242  Weight: 54 kg    Examination:  General: Grimaces to painful stimuli otherwise unresponsive positive acute respiratory distress Eyes: negative scleral hemorrhage, negative anisocoria, negative icterus  ENT: Negative Runny nose, negative gingival bleeding, Neck:  Negative scars, masses, torticollis, lymphadenopathy, JVD Lungs: Clear to auscultation bilaterally without wheezes or crackles Cardiovascular: Irregularly irregular rhythm and rate without murmur gallop or rub normal S1 and S2 Abdomen: negative abdominal pain, nondistended, positive soft, bowel sounds, no rebound, no ascites, no appreciable mass Extremities: No significant cyanosis, clubbing, or edema bilateral lower extremities Skin: Negative rashes, lesions, ulcers Psychiatric: Unable to evaluate  secondary to AMS Central nervous system: Grimaces to painful stimuli otherwise unable to evaluate secondary to AMS .     Data Reviewed: Care during the described time interval was provided by me .  I have reviewed this patient's available data, including medical history, events of note, physical examination, and all test results as part of my evaluation.  CBC: Recent Labs  Lab 03/24/20 1704 03/24/20 1712  WBC 18.1*  --   NEUTROABS 16.8*  --   HGB 10.5* 9.9*  HCT 34.5* 29.0*  MCV 98.6  --   PLT 303  --    Basic Metabolic Panel: Recent Labs  Lab 03/24/20 1704 03/24/20 1712  NA 155* 155*  K 4.7 4.5  CL 121*  --   CO2 21*  --   GLUCOSE 113*  --   BUN 36*  --   CREATININE 1.19  --   CALCIUM 8.8*  --    GFR: Estimated Creatinine Clearance: 36.6 mL/min (by C-G formula based on SCr of 1.19 mg/dL). Liver Function Tests: Recent Labs  Lab 03/24/20 1704  AST 28  ALT 16  ALKPHOS 68  BILITOT 1.0  PROT 6.5  ALBUMIN 2.0*   No results for input(s): LIPASE, AMYLASE in the last 168 hours. No results for input(s): AMMONIA in the last 168 hours. Coagulation Profile: Recent Labs  Lab 03/24/20 1704  INR 1.4*   Cardiac Enzymes: No results for input(s): CKTOTAL, CKMB, CKMBINDEX, TROPONINI in the last 168 hours. BNP (last 3 results) No results for input(s): PROBNP in the last 8760 hours. HbA1C: Recent Labs    03/25/20 0500  HGBA1C 5.1   CBG: No results for input(s): GLUCAP in the last 168 hours. Lipid Profile: Recent Labs    03/25/20 0500  CHOL 105  HDL 37*  LDLCALC 53  TRIG 76  CHOLHDL 2.8   Thyroid Function Tests: No results for input(s): TSH, T4TOTAL, FREET4, T3FREE, THYROIDAB in the last 72 hours. Anemia Panel: No results for input(s): VITAMINB12, FOLATE, FERRITIN, TIBC, IRON, RETICCTPCT in the last 72 hours. Sepsis Labs: Recent Labs  Lab 03/24/20 1704 03/24/20 2003  LATICACIDVEN 2.3* 2.2*    Recent Results (from the past 240 hour(s))  Respiratory  Panel by RT PCR (Flu A&B, Covid) - Nasopharyngeal Swab     Status: None   Collection Time: 03/24/20  8:03 PM   Specimen: Nasopharyngeal Swab  Result Value Ref Range Status   SARS Coronavirus 2 by RT PCR NEGATIVE NEGATIVE Final    Comment: (NOTE) SARS-CoV-2 target nucleic acids are NOT DETECTED. The SARS-CoV-2 RNA is generally detectable in upper respiratoy specimens during the acute phase of infection. The lowest concentration of SARS-CoV-2 viral copies this assay can detect is 131 copies/mL. A negative result does not preclude SARS-Cov-2 infection and should not be used as the sole basis for treatment or other patient management decisions. A negative result may occur with  improper specimen collection/handling, submission of specimen other than nasopharyngeal swab, presence of viral mutation(s) within the areas targeted by this assay, and inadequate number of viral copies (<  131 copies/mL). A negative result must be combined with clinical observations, patient history, and epidemiological information. The expected result is Negative. Fact Sheet for Patients:  PinkCheek.be Fact Sheet for Healthcare Providers:  GravelBags.it This test is not yet ap proved or cleared by the [place] FDA and  has been authorized for detection and/or diagnosis of SARS-CoV-2 by FDA under an Emergency Use Authorization (EUA). This EUA will remain  in effect (meaning this test can be used) for the duration of the COVID-19 declaration under Section 564(b)(1) of the Act, 21 U.S.C. section 360bbb-3(b)(1), unless the authorization is terminated or revoked sooner.    Influenza A by PCR NEGATIVE NEGATIVE Final   Influenza B by PCR NEGATIVE NEGATIVE Final    Comment: (NOTE) The Xpert Xpress SARS-CoV-2/FLU/RSV assay is intended as an aid in  the diagnosis of influenza from Nasopharyngeal swab specimens and  should not be used as a sole basis for  treatment. Nasal washings and  aspirates are unacceptable for Xpert Xpress SARS-CoV-2/FLU/RSV  testing. Fact Sheet for Patients: PinkCheek.be Fact Sheet for Healthcare Providers: GravelBags.it This test is not yet approved or cleared by the [place] FDA and  has been authorized for detection and/or diagnosis of SARS-CoV-2 by  FDA under an Emergency Use Authorization (EUA). This EUA will remain  in effect (meaning this test can be used) for the duration of the  Covid-19 declaration under Section 564(b)(1) of the Act, 21  U.S.C. section 360bbb-3(b)(1), unless the authorization is  terminated or revoked. Performed at [facility], [place] [address] [place], [place] [address]   MRSA PCR Screening     Status: Abnormal   Collection Time: 03/25/20  9:13 AM   Specimen: Nasal Mucosa; Nasopharyngeal  Result Value Ref Range Status   MRSA by PCR POSITIVE (A) NEGATIVE Final    Comment:        The GeneXpert MRSA Assay (FDA approved for NASAL specimens only), is one component of a comprehensive MRSA colonization surveillance program. It is not intended to diagnose MRSA infection nor to guide or monitor treatment for MRSA infections. RESULT CALLED TO, READ BACK BY AND VERIFIED WITH: Laurette Schimke RN 11:10 03/25/20 ([email or web address]) Performed at [facility], [place] [address], [place], [place] [address]          Radiology Studies: CT Head Wo Contrast  Result Date: 03/24/2020 CLINICAL DATA:  Increasing altered mental status EXAM: CT HEAD WITHOUT CONTRAST TECHNIQUE: Contiguous axial images were obtained from the base of the skull through the vertex without intravenous contrast. COMPARISON:  09/29/2019 FINDINGS: Brain: There is a geographic area of decreased attenuation identified in the left frontal parietal region best seen on image number 26 of series 3 which is new from the prior exam consistent with subacute ischemia in the  distribution of the left middle cerebral artery. Scattered atrophic changes and chronic white matter ischemic change is seen. Changes of prior left temporal infarct are noted. No acute hemorrhage or space-occupying mass lesion is noted. Vascular: No hyperdense vessel or unexpected calcification. Skull: Normal. Negative for fracture or focal lesion. Sinuses/Orbits: No acute finding. Other: None. IMPRESSION: Area of new decreased attenuation in the distribution of the left middle cerebral artery as described consistent with acute to subacute ischemia. Chronic atrophic and ischemic changes. Electronically Signed   By: Inez Catalina M.D.   On: 03/24/2020 19:50   CT Angio Chest PE W/Cm &/Or Wo Cm  Result Date: 03/24/2020 CLINICAL DATA:  Shortness of breath EXAM: CT  ANGIOGRAPHY CHEST WITH CONTRAST TECHNIQUE: Multidetector CT imaging of the chest was performed using the standard protocol during bolus administration of intravenous contrast. Multiplanar CT image reconstructions and MIPs were obtained to evaluate the vascular anatomy. CONTRAST:  135mL OMNIPAQUE IOHEXOL 350 MG/ML SOLN COMPARISON:  None. FINDINGS: Cardiovascular: Satisfactory opacification of the pulmonary arteries to the segmental level. No evidence of pulmonary embolism. Cardiomegaly with biatrial enlargement. No pericardial effusion. Coronary artery and aortic calcification. Mediastinum/Nodes: Likely reactive mildly prominent right suprahilar node. Thyroid is unremarkable. Esophagus is unremarkable. Lungs/Pleura: Right basilar consolidation. Additional patchy atelectasis/consolidation. Mucous plugging noted. No pleural effusion or pneumothorax. Upper Abdomen: Dictated separately Musculoskeletal: Degenerative changes of the thoracic spine. Review of the MIP images confirms the above findings. IMPRESSION: No evidence of acute pulmonary embolism. Right lower lobe pneumonia. Additional patchy atelectasis/consolidation. Coronary artery calcification. Aortic  Atherosclerosis (ICD10-I70.0). Electronically Signed   By: Macy Mis M.D.   On: 03/24/2020 20:07   CT Abdomen Pelvis W Contrast  Result Date: 03/24/2020 CLINICAL DATA:  Abdominal abscess EXAM: CT ABDOMEN AND PELVIS WITH CONTRAST TECHNIQUE: Multidetector CT imaging of the abdomen and pelvis was performed using the standard protocol following bolus administration of intravenous contrast. CONTRAST:  135mL OMNIPAQUE IOHEXOL 350 MG/ML SOLN COMPARISON:  None. FINDINGS: Lower chest: The visualized heart size within normal limits. No pericardial fluid/thickening. No hiatal hernia. Patchy airspace opacity seen at the posterior right lung base. Hepatobiliary: The liver is normal in density without focal abnormality.The main portal vein is patent. No evidence of calcified gallstones, gallbladder wall thickening or biliary dilatation. Pancreas: Unremarkable. No pancreatic ductal dilatation or surrounding inflammatory changes. Spleen: Normal in size without focal abnormality. Adrenals/Urinary Tract: Both adrenal glands appear normal. The kidneys and collecting system appear normal without evidence of urinary tract calculus or hydronephrosis. There is diffuse bladder wall thickening present. Stomach/Bowel: The stomach, small bowel, are normal in appearance. Scattered colonic diverticula are noted. There also appears to be diffuse wall thickening seen around the distal sigmoid colon and rectum with presacral edema and mild fat stranding changes. No loculated there is also non loculated fluid seen posterior to the sigmoid rectal junction, series 5, image 60. No free air however is noted. Vascular/Lymphatic: There are no enlarged mesenteric, retroperitoneal, or pelvic lymph nodes. Scattered aortic atherosclerotic calcifications are seen without aneurysmal dilatation. Reproductive: The prostate gland is heterogeneous and slightly enlarged. Other: No evidence of abdominal wall mass or hernia. Musculoskeletal: No acute or  significant osseous findings. IMPRESSION: 1. Findings which could be suggestive of distal sigmoid colonic diverticulitis and proctitis. There is surrounding non loculated fluid/phlegmon seen adjacent to the distal sigmoid colon. No definite loculated fluid collection or free air is seen. 2. Partially decompressed bladder with diffuse wall thickening which could be due to chronic cystitis. 3.  Aortic Atherosclerosis (ICD10-I70.0). 4. Partially visualized patchy airspace opacity at the posterior right lung base, likely due to aspiration/pneumonia. Electronically Signed   By: Prudencio Pair M.D.   On: 03/24/2020 20:11   DG Chest Port 1 View  Result Date: 03/24/2020 CLINICAL DATA:  85 year old male code sepsis. EXAM: PORTABLE CHEST 1 VIEW COMPARISON:  Chest CTA 09/29/2019 and earlier. FINDINGS: Portable AP semi upright view at 1715 hours. Mildly rotated to the right. Skin folds projecting over the chest. Lower lung volumes. Stable cardiomegaly and mediastinal contours. Calcified aortic atherosclerosis. Stable lung markings, no acute pulmonary opacity. No pneumothorax or pleural effusion identified. Negative visible bowel gas pattern. No acute osseous abnormality identified. IMPRESSION: 1.  No acute cardiopulmonary abnormality.  2.  Aortic Atherosclerosis (ICD10-I70.0). Electronically Signed   By: Genevie Ann M.D.   On: 03/24/2020 17:36        Scheduled Meds: .  stroke: mapping our early stages of recovery book   Does not apply Once  . Chlorhexidine Gluconate Cloth  6 each Topical Q0600  . mupirocin ointment  1 application Nasal BID   Continuous Infusions: . piperacillin-tazobactam (ZOSYN)  IV 3.375 g (03/25/20 0606)  . valproate sodium 95 mg (03/25/20 0839)     LOS: 1 day    Time spent:40 min    Joseph Winters, Joseph Docker, MD [facility] Pager [phone]  If 7PM-7AM, please contact night-coverage [email or web address] Password [facility] 03/25/2020, 11:09 AM

## 2020-03-25 NOTE — Progress Notes (Addendum)
Chart Reviewed. Pt seen briefly, but was getting peri-care by staff at time of visit which limited interaction. The pt has severe dementia and is in very poor baseline health. At baseline he is in a SNF and is bed bound with decubitus ulcers, nonverbal and unable to follow commands. He is contracted w/increased tone throughout and is lying in fetal position. Stroke team consulted for stoke in setting of code sepsis with low BP and elevated lactate. There is no acute interventions for stroke. He will d/c to hospice care with no secondary stroke prevention planned. We will sign off at this time. Please call back if needed.  I personally reviewed history of presenting illness, electronic medical records and imaging films in PACS.  Recommend aspirin for stroke prevention and hospice and palliative care measures only.  Would not do aggressive stroke evaluation at the present time given his poor general medical condition.  Kindly call for questions.  Stroke team will sign off. D/y Dr Sherral Hammers. I have spent a total of  25  minutes with the patient reviewing hospital notes,  test results, labs and examining the patient as well as establishing an assessment and plan that was discussed personally with the patient.  > 50% of time was spent in direct patient care.      Antony Contras, MD

## 2020-03-25 NOTE — ED Notes (Signed)
Attempted to call report second time ?

## 2020-03-25 NOTE — ED Notes (Signed)
Attempted to call report

## 2020-03-25 NOTE — Evaluation (Signed)
Occupational Therapy Evaluation Patient Details Name: Joseph Winters MRN: [id number] DOB: 12-10-1935 Today's Date: 03/25/2020    History of Present Illness 85 yo admitted from SNF with increased WOB and sepsis. Workup shows acute to subacute L MCA ischemia as well as PNA with sepsis. PMHx: advanced Alzeimers non verbal and non ambulatory, SDH, AFib, CAD, HTN   Clinical Impression   Pt admitted with above diagnoses, from SNF with advanced Alzheimer's disease. Pt has stage III sacral wound and BLE contractures. He has a fixed R upward gaze and does not respond to threat. Pt was not able to follow any commands throughout session, only minimally responded to pain. Completed total A peri care at bed level, RN present for dressing change. Given current total A status, no further OT needs are warranted at this time. Pt briefly sat EOB With total A +2 with increased signs of distress. Recommend palliative consult with possible hospice follow up for custodial care of patient. OT will sign off, thank you for this consult.    Follow Up Recommendations  SNF;Supervision/Assistance - 24 hour;Other (comment)(hospice?)    Equipment Recommendations  None recommended by OT    Recommendations for Other Services Other (comment)(Palliative)     Precautions / Restrictions Precautions Precautions: Fall Precaution Comments: nonverbal, nonambulatory at baseline Restrictions Weight Bearing Restrictions: No      Mobility Bed Mobility Overal bed mobility: Needs Assistance Bed Mobility: Rolling;Supine to Sit;Sit to Supine Rolling: Total assist;+2 for physical assistance;+2 for safety/equipment   Supine to sit: Total assist;+2 for physical assistance;+2 for safety/equipment Sit to supine: Total assist;+2 for physical assistance;+2 for safety/equipment   General bed mobility comments: pt was total A to roll in bed for peri care and dressing change with RN present. Pt sat EOB ~2 minutes with total A +2 with  improved oxygenation but increased HR and signs of distress. Pt is contracted and does not appear to mobilize OOB much at all at baseline  Transfers                      Balance Overall balance assessment: Needs assistance Sitting-balance support: No upper extremity supported;Feet supported Sitting balance-Leahy Scale: Zero Sitting balance - Comments: total A                                   ADL either performed or assessed with clinical judgement   ADL                                         General ADL Comments: Pt is total care for BADLs at this time due to inability to follow commands or attend to tasks     Vision   Additional Comments: Pt with fixed upward R gaze, only L eye opened. Did not respond to threat     Perception     Praxis      Pertinent Vitals/Pain Pain Assessment: Faces Faces Pain Scale: Hurts little more Pain Location: grimacing with dressing change Pain Descriptors / Indicators: Grimacing Pain Intervention(s): Monitored during session;Repositioned     Hand Dominance     Extremity/Trunk Assessment Upper Extremity Assessment Upper Extremity Assessment: Generalized weakness;Difficult to assess due to impaired cognition;RUE deficits/detail RUE Deficits / Details: difficult to assess due to cognition but not volitionally moving as well as LUE  Lower Extremity Assessment Lower Extremity Assessment: Defer to PT evaluation       Communication Communication Communication: Other (comment)(nonverbal)   Cognition Arousal/Alertness: Awake/alert Behavior During Therapy: Flat affect Overall Cognitive Status: History of cognitive impairments - at baseline                                 General Comments: history of advanced dementia at baseline, nonverbal. Facility reports AMS from baseline. Pt not following any commands, tracking, or verbalizing in session. Responds intermittently to pain   General  Comments       Exercises     Shoulder Instructions      Home Living Family/patient expects to be discharged to:: Skilled nursing facility                                 [facility]      Prior Functioning/Environment Level of Independence: Needs assistance        Comments: assist from SNF, was nonambulatory and nonverbal at baseline due to worsening dementia. Presents with BLE contractures and stage 3 sacral wound        OT Problem List: Decreased strength;Decreased cognition;Decreased activity tolerance;Pain      OT Treatment/Interventions:      OT Goals(Current goals can be found in the care plan section) Acute Rehab OT Goals OT Goal Formulation: Patient unable to participate in goal setting Time For Goal Achievement: 04/08/20 Potential to Achieve Goals: Good  OT Frequency:     Barriers to D/C:            Co-evaluation PT/OT/SLP Co-Evaluation/Treatment: Yes Reason for Co-Treatment: Necessary to address cognition/behavior during functional activity;For patient/therapist safety PT goals addressed during session: Mobility/safety with mobility OT goals addressed during session: ADL's and self-care      AM-PAC OT "6 Clicks" Daily Activity     Outcome Measure Help from another person eating meals?: Total Help from another person taking care of personal grooming?: Total Help from another person toileting, which includes using toliet, bedpan, or urinal?: Total Help from another person bathing (including washing, rinsing, drying)?: Total Help from another person to put on and taking off regular upper body clothing?: Total Help from another person to put on and taking off regular lower body clothing?: Total 6 Click Score: 6   End of Session Equipment Utilized During Treatment: Oxygen Nurse Communication: Mobility status;Other (comment)(dressing)  Activity Tolerance: Treatment limited secondary to medical complications  (Comment);Other (comment)(cognition) Patient left: in bed;with call bell/phone within reach  OT Visit Diagnosis: Other symptoms and signs involving cognitive function;Other abnormalities of gait and mobility (R26.89)                Time: [id number] OT Time Calculation (min): 25 min Charges:  OT General Charges $OT Visit: 1 Visit OT Evaluation $OT Eval Moderate Complexity: [place], MSOT, OTR/L [facility] Office Number: [phone] Pager: [phone]  Zenovia Jarred 03/25/2020, 11:14 AM

## 2020-03-25 NOTE — Plan of Care (Signed)
  Problem: Education: Goal: Knowledge of General Education information will improve Description: Including pain rating scale, medication(s)/side effects and non-pharmacologic comfort measures Outcome: Progressing   Problem: Education: Goal: Knowledge of General Education information will improve Description: Including pain rating scale, medication(s)/side effects and non-pharmacologic comfort measures Outcome: Progressing   Problem: Health Behavior/Discharge Planning: Goal: Ability to manage health-related needs will improve Outcome: Progressing   Problem: Clinical Measurements: Goal: Ability to maintain clinical measurements within normal limits will improve Outcome: Progressing Goal: Will remain free from infection Outcome: Progressing Goal: Diagnostic test results will improve Outcome: Progressing Goal: Respiratory complications will improve Outcome: Progressing Goal: Cardiovascular complication will be avoided Outcome: Progressing   Problem: Activity: Goal: Risk for activity intolerance will decrease Outcome: Progressing   Problem: Nutrition: Goal: Adequate nutrition will be maintained Outcome: Progressing   Problem: Coping: Goal: Level of anxiety will decrease Outcome: Progressing   Problem: Elimination: Goal: Will not experience complications related to bowel motility Outcome: Progressing Goal: Will not experience complications related to urinary retention Outcome: Progressing   Problem: Pain Managment: Goal: General experience of comfort will improve Outcome: Progressing   Problem: Safety: Goal: Ability to remain free from injury will improve Outcome: Progressing   Problem: Skin Integrity: Goal: Risk for impaired skin integrity will decrease Outcome: Progressing   Problem: Education: Goal: Knowledge of the prescribed therapeutic regimen will improve Outcome: Progressing   Problem: Coping: Goal: Ability to identify and develop effective coping  behavior will improve Outcome: Progressing   Problem: Clinical Measurements: Goal: Quality of life will improve Outcome: Progressing   Problem: Respiratory: Goal: Verbalizations of increased ease of respirations will increase Outcome: Progressing   Problem: Role Relationship: Goal: Family's ability to cope with current situation will improve Outcome: Progressing Goal: Ability to verbalize concerns, feelings, and thoughts to partner or family member will improve Outcome: Progressing   Problem: Pain Management: Goal: Satisfaction with pain management regimen will improve Outcome: Progressing

## 2020-03-25 NOTE — Consult Note (Signed)
[place] Nurse Consult Note: Reason for Consult: Stage 3 sacral pressure injury with evidence of previous wound healing (scarring). There is an darkly pigmented portion of the wound bed (20%) from 1-3 o'clock that is nonviable. Patient is severely contracted at the knees and elbows and turns only from side to side while in bed.  Bilateral hips, knees and bony prominences of the feet are intact. Wound type: Pressure Pressure Injury POA: Yes Measurement: Sacrum:  Stage 3 PI:  8cm x 4.8cm x 0.2cm 80% red wound with scant serous exudate with dark purple area at wound bed from 1-3 o'clock.  I am unable to insert a cotton tipped applicator tip into the nonviable tissue located close to the periphery at 3 o'clock, but feel as time and the ulcer progress, this area will open and extend the wound measurements. Wound bed:As described above Drainage (amount, consistency, odor) As described above Periwound: mild periwound erythema without induration or warmth Dressing procedure/placement/frequency: I will provide a mattress replacement with low air loss feature.  Topical wound care will be with a twice daily saline dressing to the sacral wound. Prophylactic dressings to the hips, medial knees and feet are indicated as these areas are currently intact and the dressings will mitigate pressure.  The wound is not likely to heal despite aggressive measures given the patient's overall condition of frailty, including nutritional status.  Out goal will be to promote comfort, manage exudate or odor as they present and delay progression as able.  [facility] nursing team will not follow, but will remain available to this patient, the nursing and medical teams.  Please re-consult if needed. Thanks, Maudie Flakes, MSN, RN, [place], Arther Abbott  Pager# [phone]

## 2020-03-25 NOTE — Progress Notes (Signed)
Ship broker received call from daughter, Joseph Winters, with request for pt to transfer to [facility] for EOL care. Pt's chart currently in review by hospice staff to determine eligibility for [facility].     Writer spoke with pt's wife, Joseph Winters, to confirm interest. Answered all questions and explained hospice philosophy. Explained that [place] will inform her if pt is approved, as well as hospital staff.     Please do not hesitate to outreach with any questions.     Freddie Breech, RN  [facility] Liaison  [phone]

## 2020-03-25 NOTE — ED Notes (Signed)
Pt required NT suctioning for loud, rhonchorous breath sounds. Pt's SPO2 sensor has not been picking up a good wave form despite multiple attempts at repositioning and warming pt's extremities. This nurse provided NT suctioning to pt's L nare and copious thick/tenacious yellow secretions were retrieved.

## 2020-03-25 NOTE — TOC Progression Note (Addendum)
Transition of Care [facility]) - Progression Note    Patient Details  Name: Joseph Winters MRN: [id number] Date of Birth: 12-15-1934  Transition of Care [facility]) CM/SW Contact  Zenon Mayo, RN Phone Number: 03/25/2020, 1:21 PM  Clinical Narrative:    NCM received message from Freddie Breech, she states she got referral for [facility], NCM checked with Vinnie Level the Staff RN she states yes this is what wife wants, she has left to go home.  NCM informed Freddie Breech what staff RN states yes this is what wife wants.  Jennier with [facility] will contact wife at home and facilitate him going to [facility], they do not have a bed today per Anderson Malta, but will have one tomorrow.        Expected Discharge Plan and Services                                                 Social Determinants of Health (SDOH) Interventions    Readmission Risk Interventions No flowsheet data found.

## 2020-03-25 NOTE — Evaluation (Addendum)
Physical Therapy Evaluation/ Discharge Patient Details Name: Joseph Winters MRN: [id number] DOB: [birth year] Today's Date: 03/25/2020   History of Present Illness  85 yo admitted from SNF with increased WOB and sepsis. Workup shows acute to subacute L MCA ischemia as well as PNA with sepsis. PMHx: advanced Alzeimers non verbal and non ambulatory, SDH, AFib, CAD, HTN  Clinical Impression  Pt seen in conjunction with OT this am per orders. Pt in left sidelying on arrival with maintained right upper gaze fixation and SpO2 95% on 2L with drop to 84% during mobility. PT with Vtach to 125 during brief trial of sitting upright and returned to supine. Pt requires total assist for all mobility with bowel and bladder incontinence with stage 3 sacral wound. Pt with soiled brief on arrival with assist to remove and provide pericare with RN present for dressing change. Pt unable to actively participate in therapy and does not warrant further services. Recommendation for frequent positional/pressure changes with nursing staff and return to SNF.     Follow Up Recommendations No PT follow up(return to SNF)    Equipment Recommendations  None recommended by PT    Recommendations for Other Services       Precautions / Restrictions Precautions Precautions: Fall Precaution Comments: nonverbal, nonambulatory at baseline, sacral wound, contracted extremities Restrictions Weight Bearing Restrictions: No      Mobility  Bed Mobility Overal bed mobility: Needs Assistance Bed Mobility: Rolling;Supine to Sit;Sit to Supine Rolling: Total assist;+2 for physical assistance;+2 for safety/equipment   Supine to sit: Total assist;+2 for physical assistance;+2 for safety/equipment Sit to supine: Total assist;+2 for physical assistance;+2 for safety/equipment   General bed mobility comments: pt was total A to roll in bed for peri care and dressing change with RN present. Pt sat EOB ~2 minutes with total A +2 with  improved oxygenation but increased HR and signs of distress. Pt is contracted and does not appear to mobilize OOB much at all at baseline  Transfers                    Ambulation/Gait                Stairs            Wheelchair Mobility    Modified Rankin (Stroke Patients Only)       Balance Overall balance assessment: Needs assistance Sitting-balance support: No upper extremity supported;Feet supported Sitting balance-Leahy Scale: Zero Sitting balance - Comments: total A                                     Pertinent Vitals/Pain Pain Assessment: Faces Faces Pain Scale: Hurts little more Pain Location: grimacing with dressing change Pain Descriptors / Indicators: Grimacing Pain Intervention(s): Repositioned    Home Living Family/patient expects to be discharged to:: Skilled nursing facility                 Additional [facility]    Prior Function Level of Independence: Needs assistance         Comments: assist from SNF, was nonambulatory and nonverbal at baseline due to worsening dementia. Presents with BLE contractures and stage 3 sacral wound     Hand Dominance        Extremity/Trunk Assessment   Upper Extremity Assessment Upper Extremity Assessment: Defer to OT evaluation RUE Deficits / Details: difficult to assess due to  cognition but not volitionally moving as well as LUE    Lower Extremity Assessment Lower Extremity Assessment: RLE deficits/detail;LLE deficits/detail RLE Deficits / Details: bil hip and knee flexion contractures barely able to achieve 90 degrees flexion all joints, slight ankle movement as only active motion. hip abduction very limited grossly 5-10 degrees LLE Deficits / Details: bil hip and knee flexion contractures barely able to achieve 90 degrees flexion all joints, slight ankle movement as only active motion. hip abduction very limited grossly 5-10 degrees    Cervical  / Trunk Assessment Cervical / Trunk Assessment: Kyphotic  Communication   Communication: Other (comment)(nonverbal)  Cognition Arousal/Alertness: Awake/alert Behavior During Therapy: Flat affect Overall Cognitive Status: History of cognitive impairments - at baseline                                 General Comments: history of advanced dementia at baseline, nonverbal. Facility reports AMS from baseline. Pt not following any commands, tracking, or verbalizing in session. Responds intermittently to pain      General Comments      Exercises     Assessment/Plan    PT Assessment Patent does not need any further PT services  PT Problem List         PT Treatment Interventions      PT Goals (Current goals can be found in the Care Plan section)  Acute Rehab PT Goals PT Goal Formulation: All assessment and education complete, DC therapy    Frequency     Barriers to discharge        Co-evaluation PT/OT/SLP Co-Evaluation/Treatment: Yes Reason for Co-Treatment: Complexity of the patient's impairments (multi-system involvement);For patient/therapist safety PT goals addressed during session: Mobility/safety with mobility;Balance OT goals addressed during session: ADL's and self-care       AM-PAC PT "6 Clicks" Mobility  Outcome Measure Help needed turning from your back to your side while in a flat bed without using bedrails?: Total Help needed moving from lying on your back to sitting on the side of a flat bed without using bedrails?: Total Help needed moving to and from a bed to a chair (including a wheelchair)?: Total Help needed standing up from a chair using your arms (e.g., wheelchair or bedside chair)?: Total Help needed to walk in hospital room?: Total Help needed climbing 3-5 steps with a railing? : Total 6 Click Score: 6    End of Session   Activity Tolerance: Patient limited by pain Patient left: in bed;with call bell/phone within reach;with bed  alarm set;with nursing/sitter in room Nurse Communication: Mobility status;Need for lift equipment PT Visit Diagnosis: Other abnormalities of gait and mobility (R26.89);Muscle weakness (generalized) (M62.81)    Time: [id number] PT Time Calculation (min) (ACUTE ONLY): 23 min   Charges:   PT Evaluation $PT Eval Moderate Complexity: 1 Mod          [place], PT Acute Rehabilitation Services Pager: [phone] Office: [phone]   Sandy Salaam Tramaine Sauls 03/25/2020, 12:07 PM

## 2020-03-25 NOTE — Progress Notes (Signed)
Palliative:  Full consult note to follow. I have met with wife at bedside. Joseph Winters appears more comfortable at this time and is resting. Discussed transition to comfort care and she is interested in pursuing [facility] if bed available. Discussed that he may decline in hospital and could die here but either way we will ensure he is comfortable. Encouraged her to reach out to family/friends that will want to visit with him and reviewed visitation policy for patient's at end of life. Comfort orders and prn medications placed.   Vinie Sill, NP Palliative Medicine Team Pager [phone] (Please see [email or web address] for schedule) Team Phone [phone]

## 2020-03-26 DIAGNOSIS — A419 Sepsis, unspecified organism: Secondary | ICD-10-CM | POA: Diagnosis not present

## 2020-03-26 DIAGNOSIS — Z515 Encounter for palliative care: Secondary | ICD-10-CM | POA: Diagnosis not present

## 2020-03-26 DIAGNOSIS — G301 Alzheimer's disease with late onset: Secondary | ICD-10-CM | POA: Diagnosis not present

## 2020-03-26 DIAGNOSIS — I482 Chronic atrial fibrillation, unspecified: Secondary | ICD-10-CM | POA: Diagnosis not present

## 2020-03-26 DIAGNOSIS — J189 Pneumonia, unspecified organism: Secondary | ICD-10-CM | POA: Diagnosis not present

## 2020-03-26 LAB — URINE CULTURE: Culture: >=100000 — AB

## 2020-03-26 MED ORDER — MORPHINE SULFATE (CONCENTRATE) 10 MG/0.5ML PO SOLN: 5.0000 mg | ORAL | 0 refills | Status: AC | PRN

## 2020-03-26 MED ORDER — METOPROLOL TARTRATE 5 MG/5ML IV SOLN: 5.0000 mg | Freq: Four times a day (QID) | INTRAVENOUS | 0 refills | Status: AC | PRN

## 2020-03-26 MED ORDER — HALOPERIDOL LACTATE 5 MG/ML IJ SOLN: 0.5000 mg | INTRAMUSCULAR | 0 refills | Status: AC | PRN

## 2020-03-26 MED ORDER — HALOPERIDOL LACTATE 2 MG/ML PO CONC: 0.6000 mg | ORAL | 0 refills | Status: AC | PRN

## 2020-03-26 MED ORDER — MORPHINE SULFATE (CONCENTRATE) 10 MG/0.5ML PO SOLN: 5.0000 mg | ORAL | Status: DC | PRN

## 2020-03-26 MED ORDER — VALPROATE SODIUM 500 MG/5ML IV SOLN: 95.0000 mg | Freq: Four times a day (QID) | INTRAVENOUS | 0 refills | Status: AC

## 2020-03-26 MED ORDER — MORPHINE SULFATE (CONCENTRATE) 10 MG/0.5ML PO SOLN
5.0000 mg | ORAL | Status: DC
  Administered 2020-03-26: 10:00:00 5 mg via SUBLINGUAL
  Filled 2020-03-26: qty 0.5

## 2020-03-26 MED ORDER — BIOTENE DRY MOUTH MT LIQD: 15.0000 mL | OROMUCOSAL | 0 refills | Status: AC | PRN

## 2020-03-26 MED ORDER — HALOPERIDOL 0.5 MG PO TABS: 0.5000 mg | ORAL_TABLET | ORAL | 0 refills | Status: AC | PRN

## 2020-03-26 MED ORDER — MORPHINE SULFATE (PF) 2 MG/ML IV SOLN: 2.0000 mg | INTRAVENOUS | 0 refills | Status: AC | PRN

## 2020-03-26 MED ORDER — GLYCOPYRROLATE 0.2 MG/ML IJ SOLN: 0.2000 mg | Freq: Three times a day (TID) | INTRAMUSCULAR | 0 refills | Status: AC

## 2020-03-26 MED ORDER — ONDANSETRON 4 MG PO TBDP: 4.0000 mg | ORAL_TABLET | Freq: Four times a day (QID) | ORAL | 0 refills | Status: AC | PRN

## 2020-03-26 MED ORDER — LORAZEPAM 2 MG/ML IJ SOLN: 1.0000 mg | INTRAMUSCULAR | 0 refills | Status: AC | PRN

## 2020-03-26 MED ORDER — MORPHINE SULFATE (PF) 2 MG/ML IV SOLN: 2.0000 mg | INTRAVENOUS | Status: DC | PRN

## 2020-03-26 NOTE — Plan of Care (Signed)
  Problem: Education: Goal: Knowledge of General Education information will improve Description: Including pain rating scale, medication(s)/side effects and non-pharmacologic comfort measures Outcome: Adequate for Discharge   Problem: Health Behavior/Discharge Planning: Goal: Ability to manage health-related needs will improve Outcome: Adequate for Discharge   Problem: Clinical Measurements: Goal: Ability to maintain clinical measurements within normal limits will improve Outcome: Adequate for Discharge Goal: Will remain free from infection Outcome: Adequate for Discharge Goal: Diagnostic test results will improve Outcome: Adequate for Discharge Goal: Respiratory complications will improve Outcome: Adequate for Discharge Goal: Cardiovascular complication will be avoided Outcome: Adequate for Discharge   Problem: Activity: Goal: Risk for activity intolerance will decrease Outcome: Adequate for Discharge   Problem: Nutrition: Goal: Adequate nutrition will be maintained Outcome: Adequate for Discharge   Problem: Coping: Goal: Level of anxiety will decrease Outcome: Adequate for Discharge   Problem: Elimination: Goal: Will not experience complications related to bowel motility Outcome: Adequate for Discharge Goal: Will not experience complications related to urinary retention Outcome: Adequate for Discharge   Problem: Pain Managment: Goal: General experience of comfort will improve Outcome: Adequate for Discharge   Problem: Safety: Goal: Ability to remain free from injury will improve Outcome: Adequate for Discharge   Problem: Skin Integrity: Goal: Risk for impaired skin integrity will decrease Outcome: Adequate for Discharge   Problem: Education: Goal: Knowledge of the prescribed therapeutic regimen will improve Outcome: Adequate for Discharge   Problem: Clinical Measurements: Goal: Quality of life will improve Outcome: Adequate for Discharge   Problem: Role  Relationship: Goal: Family's ability to cope with current situation will improve Outcome: Adequate for Discharge Goal: Ability to verbalize concerns, feelings, and thoughts to partner or family member will improve Outcome: Adequate for Discharge   Problem: Respiratory: Goal: Verbalizations of increased ease of respirations will increase Outcome: Adequate for Discharge   Problem: Pain Management: Goal: Satisfaction with pain management regimen will improve Outcome: Adequate for Discharge   Problem: Education: Goal: Knowledge of the prescribed therapeutic regimen will improve Outcome: Adequate for Discharge   Problem: Coping: Goal: Ability to identify and develop effective coping behavior will improve Outcome: Adequate for Discharge   Problem: Clinical Measurements: Goal: Quality of life will improve Outcome: Adequate for Discharge   Problem: Respiratory: Goal: Verbalizations of increased ease of respirations will increase Outcome: Adequate for Discharge   Problem: Role Relationship: Goal: Family's ability to cope with current situation will improve Outcome: Adequate for Discharge Goal: Ability to verbalize concerns, feelings, and thoughts to partner or family member will improve Outcome: Adequate for Discharge   Problem: Pain Management: Goal: Satisfaction with pain management regimen will improve Outcome: Adequate for Discharge   Problem: Education: Goal: Knowledge of the prescribed therapeutic regimen will improve Outcome: Adequate for Discharge   Problem: Coping: Goal: Ability to identify and develop effective coping behavior will improve Outcome: Adequate for Discharge   Problem: Clinical Measurements: Goal: Quality of life will improve Outcome: Adequate for Discharge   Problem: Role Relationship: Goal: Family's ability to cope with current situation will improve Outcome: Adequate for Discharge Goal: Ability to verbalize concerns, feelings, and thoughts to  partner or family member will improve Outcome: Adequate for Discharge   Problem: Respiratory: Goal: Verbalizations of increased ease of respirations will increase Outcome: Adequate for Discharge   Problem: Pain Management: Goal: Satisfaction with pain management regimen will improve Outcome: Adequate for Discharge

## 2020-03-26 NOTE — TOC Transition Note (Addendum)
Transition of Care [facility]) - CM/SW Discharge Note   Patient Details  Name: Joseph Winters MRN: [id number] Date of Birth: 19-Oct-1935  Transition of Care [facility]) CM/SW Contact:  Alberteen Sam, LCSW Phone Number: 03/26/2020, 10:23 AM   Clinical Narrative:     Patient will DC to: [place] date: 03/26/20 Family notified: Remo Lipps  Transport [id number]  Per MD patient ready for DC to [facility] . RN, patient, patient's family, and facility notified of DC. Discharge Summary sent to facility. RN given number for report  [phone] . DC packet on chart. Ambulance transport requested for patient.  CSW signing off.  [place], [place]    Final next level of care: [place] Barriers to Discharge: No Barriers Identified   Patient Goals and CMS Choice Patient states their goals for this hospitalization and ongoing recovery are:: to go to [facility].gov Compare Post Acute Care list provided to:: Patient Represenative (must comment)(spouse Remo Lipps) Choice offered to / list presented to : Spouse  Discharge Placement              Patient chooses bed at: Other - please specify in the comment section below:([facility]) Patient to be transferred to facility by: [place] Name of family member notified: Remo Lipps Patient and family notified of of transfer: 03/26/20  Discharge Plan and Services                                     Social Determinants of Health (SDOH) Interventions     Readmission Risk Interventions No flowsheet data found.

## 2020-03-26 NOTE — Progress Notes (Signed)
Nutrition Brief Note  Chart reviewed. Pt now transitioning to comfort care.  No further nutrition interventions warranted at this time.  Please re-consult as needed.   Velton Roselle W, RD, LDN, CDCES Registered Dietitian II Certified Diabetes Care and Education Specialist Please refer to AMION for RD and/or RD on-call/weekend/after hours pager  

## 2020-03-26 NOTE — TOC Progression Note (Signed)
Transition of Care [facility]) - Progression Note    Patient Details  Name: ART MCGLOIN MRN: [id number] Date of Birth: September 02, 1935  Transition of Care [facility]) CM/SW [place], [place] Phone Number: 03/26/2020, 9:36 AM  Clinical Narrative:     CSW received messaged from Bradd Canary with hospice that [facility] has a bed for patient today, CSW has informed MD. Pending dc summary and orders for dc to [facility] today.        Expected Discharge Plan and Services                                                 Social Determinants of Health (SDOH) Interventions    Readmission Risk Interventions No flowsheet data found.

## 2020-03-26 NOTE — Progress Notes (Signed)
[facility] Documentation  °   °Pt has been approved for [facility] transfer. [facility] does have a bed available for pt today. Paperwork has been completed and transportation can be arranged.   °   °Please call [facility] at [phone] to give charge nurse report and fax discharge summary to [phone].  °   °Please dc any lines. May leave catheter in place if pt has one. Please send pt to [facility] with DNR paperwork.   °   °Please call with any questions.   °   °Thank you,   °Jennifer Love, RN  ° °

## 2020-03-26 NOTE — Care Management Important Message (Signed)
Important Message  Patient Details  Name: OVE AMAT MRN: [id number] Date of Birth: 11-07-1935   Medicare Important Message Given:  Yes Patient left prior to IM delivery.  Will Mail to his home.     Fortune Brannigan 03/26/2020, 3:05 PM

## 2020-03-26 NOTE — Progress Notes (Signed)
Pt discharged to [place], [facility] picked Pt up. Wife here with Pt until he left. [facility] requested that have PIV and Condemn cath left, per the request, left the 2 items in

## 2020-03-26 NOTE — Progress Notes (Signed)
Palliative:  85 y.o. male  with past medical history of Alzheimer's dementia, atrial fibrillation, CAD, HTN, CHF, SDH admitted on 03/24/2020 with shortness of breath and worsening mental status and found to have stroke along with concern for aspiration pneumonia and UTI.   I met today at Joseph Winters's bedside. No family at bedside. Discussed with bedside RN. He does not respond to me but does have his eyes open. He overall presents with muscle tension and overall uncomfortable. He has recently received morphine but RN reports minimal relief. Reported to have discomfort with any movement as well. Plans for transition to [facility] today and he appears stable for transfer.   Exam: Alert but no response or following commands. Eyes open but no tracking. Breathing slightly labored with no apnea. Muscles beginning to be withdrawn. Feet do not appear as mottled as yesterday.   Plan: - Morphine SL every 4 hours scheduled and morphine every hour as needed in between scheduled doses. Hopefully this will ensure more comfort and relief.  - Transition to [facility].   15 min  Vinie Sill, NP Palliative Medicine Team Pager [phone] (Please see [email or web address] for schedule) Team Phone [phone]    Greater than 50%  of this time was spent counseling and coordinating care related to the above assessment and plan

## 2020-03-26 NOTE — Discharge Summary (Addendum)
Physician Discharge Summary  Joseph Winters [id number] DOB: 07/18/1935 DOA: 03/24/2020  PCP: Joseph Orn, MD  Admit date: 03/24/2020 Discharge date: 03/26/2020  Time spent: 30 minutes  Recommendations for Outpatient Follow-up:   Acute respiratory failure with hypoxia -Titrate O2 to maintain SPO2> 88% (COMFORT CARE) -Wife wishing to discuss hospice.  Palliative care has been contacted  Sepsis due to right lower lobe pneumonia,UTI, sigmoid diverticulitis/proctitis: Suspect aspiration pneumonia as primary source but also has evidence of UTI and CT imaging suggests sigmoid diverticulitis and proctitis. Significant amount of mucus suctioned by respiratory therapy with improvement in oxygenation. (COMFORT CARE)   Acute to subacute left MCA ischemia: As seen on CT imaging. Neurology has evaluated and recommended no further stroke work-up at this time as it would not change management. He has a history of subdural hemorrhage therefore not on anticoagulation in setting of known atrial fibrillation. -Resume aspirin 81 mg daily and atorvastatin when able to reliably take oral medications -(COMFORT CARE)  Hypernatremia: In setting of dehydration from poor oral intake. Start normal saline and start D5W @ 100 mL/hrovernight and repeat labs in the morning. -4/15 discussed plan of care with wife DC'd all lab draws -(COMFORT CARE)  Chronic atrial fibrillation: -Not on anticoagulation due to history of subdural hemorrhage. Has been on aspirin 81 mg alone. Remains in atrial fibrillation with borderline tachycardia. On metoprolol as an outpatient. -Continue IV metoprolol as needed while n.p.o. -Resume aspirin when able  Hypertension: (COMFORT CARE)  CAD: -Aspirin, statin, metoprolol, Imdur on hold while NPO.  End-stageAlzheimer's dementia: Nonverbal, nonambulatory, and minimally interactiveatbaseline. He has acute worsening in setting of sepsis and CVA.Neurology  recommending continuing valproic acid (likely for mood stabilization) as he is at risk for withdrawal seizure. -Aricept on hold -Depakoteconverted to IV valproic acid with pharmacy assistance while NPO.     Discharge Diagnoses:  Principal Problem:   Sepsis due to pneumonia [facility]) Active Problems:   CAD (coronary artery disease)   Chronic atrial fibrillation (HCC)   Alzheimer disease ([place])   Sigmoid diverticulitis   Proctitis   Sepsis secondary to UTI ([place])   Hypernatremia   Pressure injury of skin   Acute CVA (cerebrovascular accident) [facility])   Terminal care   Goals of care, counseling/discussion   Palliative care by specialist   Discharge Condition: Guarded  Diet recommendation: N.p.o.  Filed Weights   03/24/20 2242  Weight: 54 kg    History of present illness:  Joseph Brine McCarronis a 85 y.o.malewith medical history significant foratrial fibrillation on anticoagulation, history of subdural hemorrhage, CAD on aspirin 81 mg daily, hypertension, and advanced Alzheimer's dementia who presents to the ED for evaluation ofincreased work of breathing and change in mentation. Patient unable to provide any significant history therefore entirety of history is obtained from wife at bedside, EDP, and chart review.  Per wife, patient has advanced Alzheimer's dementia.At his baseline he is nonverbal and nonambulatory. He does not really follow commands but will track with his eyes and move his upper extremities with some purpose. Patient is currently resident at [facility]. Staff noted that he appeared to be having increased work of breathing and no change in level of interaction. EMS were called and per ED documentation he was noted to have O2 saturation of 83% on room air with tachypnea and tachycardia. He was brought to the ED for further evaluation and management.  ED Course: Initial vitals showed BP 128/70, pulse 118, RR 26, SPO2 93% on 15 L NRB. Patient had  fever per EDP.  Labs are notable for sodium 155, potassium 4.7, bicarb 21, BUN 36, creatinine 1.19, WBC 18.1, hemoglobin 10.5, platelets 303,000. Lactic acid 2.3. VBG showed pH 7.476, PCO2 29.4, PO2 36.  Urinalysis showed positive nitrites, moderate leukocytes, 0-5 RBCs, >50 WBCs, many bacteria microscopy. Blood and urine cultures were obtained and pending. POC SARS-CoV-2 antigen is negative. SARS-CoV-2 PCR respiratory panel is obtained and pending.  Portable chest x-ray was negative for focal consolidation, edema, effusion, or pneumothorax.  CT head without contrast showed acute to subacute left middle cerebral artery distribution ischemia.  CTA chest PE study was negative for evidence of acute PE. Right lower lobe pneumonia with additional patchy atelectasis/consolidation noted.  CT abdomen/pelvis with contrast showed changes suggestive of distal sigmoid colonic diverticulitis and proctitis.  Patient was given 2.25 L normal saline, IV ceftriaxone and azithromycin. Neurology were consulted and seen the patient. The hospitalist service was consulted for further evaluation management.    Hospital Course:  After long talk with wife at bedside given patient's previous multiple medical conditions and current large stroke decided that comfort care/inpatient hospice would be in his best interest.  Stable for discharge to [facility].   Procedures: 4/14 CT head  Wo contrast; geographic area of decreased attenuation identified in the left frontal parietal region which is new from the prior exam consistent with subacute ischemia in the distribution of the left middle cerebral artery.  -Scattered atrophic changes and chronic white matter ischemic change is seen.  -Changes of prior left temporal infarct are noted.     Consultations: Palliative Care     Discharge Exam: Vitals:   03/25/20 1515 03/25/20 2100 03/25/20 2108 03/26/20 0814  BP:  (!) 110/48    Pulse: (!) 127    (!) 106  Resp:  (!) 27  (!) 26  Temp:  (!) 100.4 F (38 C) (!) 100.7 F (38.2 C)   TempSrc:  Axillary Rectal   SpO2: 99%   99%  Weight:      Height:        General: Grimaces to painful stimuli otherwise unresponsive positive acute respiratory distress Eyes: negative scleral hemorrhage, negative anisocoria, negative icterus ENT: Negative Runny nose, negative gingival bleeding, Neck:  Negative scars, masses, torticollis, lymphadenopathy, JVD Lungs: Clear to auscultation bilaterally without wheezes or crackles Cardiovascular: Irregularly irregular rhythm and rate without murmur gallop or rub normal S1 and S2 Abdomen: negative abdominal pain, nondistended, positive soft, bowel sounds, no rebound, no ascites, no appreciable mass Extremities: No significant cyanosis, clubbing, or edema bilateral lower extremities Skin: Negative rashes, lesions, ulcers Psychiatric: Unable to evaluate secondary to AMS Central nervous system: Grimaces to painful stimuli otherwise unable to evaluate secondary to AMS    Discharge Instructions   Allergies as of 03/26/2020      Reactions   Memantine    Other reaction(s): Confusion (intolerance) Made mental status worse      Medication List    STOP taking these medications   acetaminophen 500 MG tablet Commonly known as: TYLENOL   aspirin 81 MG tablet   atorvastatin 10 MG tablet Commonly known as: LIPITOR   citalopram 10 MG tablet Commonly known as: CELEXA   collagenase ointment Commonly known as: SANTYL   divalproex 125 MG capsule Commonly known as: DEPAKOTE SPRINKLE   donepezil 10 MG tablet Commonly known as: ARICEPT   doxazosin 1 MG tablet Commonly known as: Cardura   hydrALAZINE 10 MG tablet Commonly known as: APRESOLINE   isosorbide mononitrate 60 MG  24 hr tablet Commonly known as: IMDUR   Melatonin 3 MG Tbdp   Metoprolol Succinate 50 MG Cs24   OCUVITE ADULT FORMULA PO   potassium chloride SA 20 MEQ tablet Commonly  known as: KLOR-CON   ramipril 10 MG capsule Commonly known as: ALTACE   ramipril 5 MG capsule Commonly known as: ALTACE     TAKE these medications   antiseptic oral rinse Liqd Apply 15 mLs topically as needed for dry mouth.   glycopyrrolate 0.2 MG/ML injection Commonly known as: ROBINUL Inject 1 mL (0.2 mg total) into the vein 3 (three) times daily.   haloperidol 0.5 MG tablet Commonly known as: HALDOL Take 1 tablet (0.5 mg total) by mouth every 4 (four) hours as needed for agitation (or delirium).   haloperidol 2 MG/ML solution Commonly known as: HALDOL Place 0.3 mLs (0.6 mg total) under the tongue every 4 (four) hours as needed for agitation (or delirium).   haloperidol lactate 5 MG/ML injection Commonly known as: HALDOL Inject 0.1 mLs (0.5 mg total) into the vein every 4 (four) hours as needed (or delirium).   LORazepam 2 MG/ML injection Commonly known as: ATIVAN Inject 0.5-1 mLs (1-2 mg total) into the vein every 4 (four) hours as needed for anxiety or seizure.   metoprolol tartrate 5 MG/5ML Soln injection Commonly known as: LOPRESSOR Inject 5 mLs (5 mg total) into the vein every 6 (six) hours as needed (HR >120).   morphine 2 MG/ML injection Inject 1 mL (2 mg total) into the vein every hour as needed (SOB).   morphine CONCENTRATE 10 MG/0.5ML Soln concentrated solution Place 0.25 mLs (5 mg total) under the tongue every hour as needed for moderate pain, severe pain or shortness of breath.   ondansetron 4 MG disintegrating tablet Commonly known as: ZOFRAN-ODT Take 1 tablet (4 mg total) by mouth every 6 (six) hours as needed for nausea.   valproate 95 mg in dextrose 5 % 50 mL Inject 95 mg into the vein every 6 (six) hours.      Allergies  Allergen Reactions  . Memantine     Other reaction(s): Confusion (intolerance) Made mental status worse      The results of significant diagnostics from this hospitalization (including imaging, microbiology, ancillary  and laboratory) are listed below for reference.    Significant Diagnostic Studies: CT Head Wo Contrast  Result Date: 03/24/2020 CLINICAL DATA:  Increasing altered mental status EXAM: CT HEAD WITHOUT CONTRAST TECHNIQUE: Contiguous axial images were obtained from the base of the skull through the vertex without intravenous contrast. COMPARISON:  09/29/2019 FINDINGS: Brain: There is a geographic area of decreased attenuation identified in the left frontal parietal region best seen on image number 26 of series 3 which is new from the prior exam consistent with subacute ischemia in the distribution of the left middle cerebral artery. Scattered atrophic changes and chronic white matter ischemic change is seen. Changes of prior left temporal infarct are noted. No acute hemorrhage or space-occupying mass lesion is noted. Vascular: No hyperdense vessel or unexpected calcification. Skull: Normal. Negative for fracture or focal lesion. Sinuses/Orbits: No acute finding. Other: None. IMPRESSION: Area of new decreased attenuation in the distribution of the left middle cerebral artery as described consistent with acute to subacute ischemia. Chronic atrophic and ischemic changes. Electronically Signed   By: Inez Catalina M.D.   On: 03/24/2020 19:50   CT Angio Chest PE W/Cm &/Or Wo Cm  Result Date: 03/24/2020 CLINICAL DATA:  Shortness of breath EXAM:  CT ANGIOGRAPHY CHEST WITH CONTRAST TECHNIQUE: Multidetector CT imaging of the chest was performed using the standard protocol during bolus administration of intravenous contrast. Multiplanar CT image reconstructions and MIPs were obtained to evaluate the vascular anatomy. CONTRAST:  131mL OMNIPAQUE IOHEXOL 350 MG/ML SOLN COMPARISON:  None. FINDINGS: Cardiovascular: Satisfactory opacification of the pulmonary arteries to the segmental level. No evidence of pulmonary embolism. Cardiomegaly with biatrial enlargement. No pericardial effusion. Coronary artery and aortic  calcification. Mediastinum/Nodes: Likely reactive mildly prominent right suprahilar node. Thyroid is unremarkable. Esophagus is unremarkable. Lungs/Pleura: Right basilar consolidation. Additional patchy atelectasis/consolidation. Mucous plugging noted. No pleural effusion or pneumothorax. Upper Abdomen: Dictated separately Musculoskeletal: Degenerative changes of the thoracic spine. Review of the MIP images confirms the above findings. IMPRESSION: No evidence of acute pulmonary embolism. Right lower lobe pneumonia. Additional patchy atelectasis/consolidation. Coronary artery calcification. Aortic Atherosclerosis (ICD10-I70.0). Electronically Signed   By: Macy Mis M.D.   On: 03/24/2020 20:07   CT Abdomen Pelvis W Contrast  Result Date: 03/24/2020 CLINICAL DATA:  Abdominal abscess EXAM: CT ABDOMEN AND PELVIS WITH CONTRAST TECHNIQUE: Multidetector CT imaging of the abdomen and pelvis was performed using the standard protocol following bolus administration of intravenous contrast. CONTRAST:  151mL OMNIPAQUE IOHEXOL 350 MG/ML SOLN COMPARISON:  None. FINDINGS: Lower chest: The visualized heart size within normal limits. No pericardial fluid/thickening. No hiatal hernia. Patchy airspace opacity seen at the posterior right lung base. Hepatobiliary: The liver is normal in density without focal abnormality.The main portal vein is patent. No evidence of calcified gallstones, gallbladder wall thickening or biliary dilatation. Pancreas: Unremarkable. No pancreatic ductal dilatation or surrounding inflammatory changes. Spleen: Normal in size without focal abnormality. Adrenals/Urinary Tract: Both adrenal glands appear normal. The kidneys and collecting system appear normal without evidence of urinary tract calculus or hydronephrosis. There is diffuse bladder wall thickening present. Stomach/Bowel: The stomach, small bowel, are normal in appearance. Scattered colonic diverticula are noted. There also appears to be  diffuse wall thickening seen around the distal sigmoid colon and rectum with presacral edema and mild fat stranding changes. No loculated there is also non loculated fluid seen posterior to the sigmoid rectal junction, series 5, image 60. No free air however is noted. Vascular/Lymphatic: There are no enlarged mesenteric, retroperitoneal, or pelvic lymph nodes. Scattered aortic atherosclerotic calcifications are seen without aneurysmal dilatation. Reproductive: The prostate gland is heterogeneous and slightly enlarged. Other: No evidence of abdominal wall mass or hernia. Musculoskeletal: No acute or significant osseous findings. IMPRESSION: 1. Findings which could be suggestive of distal sigmoid colonic diverticulitis and proctitis. There is surrounding non loculated fluid/phlegmon seen adjacent to the distal sigmoid colon. No definite loculated fluid collection or free air is seen. 2. Partially decompressed bladder with diffuse wall thickening which could be due to chronic cystitis. 3.  Aortic Atherosclerosis (ICD10-I70.0). 4. Partially visualized patchy airspace opacity at the posterior right lung base, likely due to aspiration/pneumonia. Electronically Signed   By: Prudencio Pair M.D.   On: 03/24/2020 20:11   DG Chest Port 1 View  Result Date: 03/24/2020 CLINICAL DATA:  85 year old male code sepsis. EXAM: PORTABLE CHEST 1 VIEW COMPARISON:  Chest CTA 09/29/2019 and earlier. FINDINGS: Portable AP semi upright view at 1715 hours. Mildly rotated to the right. Skin folds projecting over the chest. Lower lung volumes. Stable cardiomegaly and mediastinal contours. Calcified aortic atherosclerosis. Stable lung markings, no acute pulmonary opacity. No pneumothorax or pleural effusion identified. Negative visible bowel gas pattern. No acute osseous abnormality identified. IMPRESSION: 1.  No acute cardiopulmonary  abnormality. 2.  Aortic Atherosclerosis (ICD10-I70.0). Electronically Signed   By: Genevie Ann M.D.   On:  03/24/2020 17:36    Microbiology: Recent Results (from the past 240 hour(s))  Blood Culture (routine x 2)     Status: None (Preliminary result)   Collection Time: 03/24/20  5:04 PM   Specimen: BLOOD  Result Value Ref Range Status   Specimen Description BLOOD RIGHT ANTECUBITAL  Final   Special Requests   Final    BOTTLES DRAWN AEROBIC AND ANAEROBIC Blood Culture adequate volume   Culture   Final    NO GROWTH < 24 HOURS Performed at [facility], [address] [place], [place] [address]    Report Status PENDING  Incomplete  Blood Culture (routine x 2)     Status: None (Preliminary result)   Collection Time: 03/24/20  5:22 PM   Specimen: BLOOD RIGHT FOREARM  Result Value Ref Range Status   Specimen Description BLOOD RIGHT FOREARM  Final   Special Requests   Final    BOTTLES DRAWN AEROBIC AND ANAEROBIC Blood Culture adequate volume   Culture   Final    NO GROWTH < 24 HOURS Performed at [facility], [place] [address] [place], [place] [address]    Report Status PENDING  Incomplete  Urine culture     Status: Abnormal   Collection Time: 03/24/20  7:22 PM   Specimen: In/Out Cath Urine  Result Value Ref Range Status   Specimen Description IN/OUT CATH URINE  Final   Special Requests   Final    NONE Performed at [facility], [place] [address] [place], [place] [address]    Culture >=100,000 COLONIES/mL ESCHERICHIA COLI (A)  Final   Report Status 03/26/2020 FINAL  Final   Organism ID, Bacteria ESCHERICHIA COLI (A)  Final      Susceptibility   Escherichia coli - MIC*    AMPICILLIN 16 INTERMEDIATE Intermediate     CEFAZOLIN 8 SENSITIVE Sensitive     CEFTRIAXONE 0.5 SENSITIVE Sensitive     CIPROFLOXACIN >=4 RESISTANT Resistant     GENTAMICIN <=1 SENSITIVE Sensitive     IMIPENEM <=0.25 SENSITIVE Sensitive     NITROFURANTOIN <=16 SENSITIVE Sensitive     TRIMETH/SULFA <=20 SENSITIVE Sensitive     AMPICILLIN/SULBACTAM 4 SENSITIVE Sensitive     PIP/TAZO <=4 SENSITIVE  Sensitive     * >=100,000 COLONIES/mL ESCHERICHIA COLI  Respiratory Panel by RT PCR (Flu A&B, Covid) - Nasopharyngeal Swab     Status: None   Collection Time: 03/24/20  8:03 PM   Specimen: Nasopharyngeal Swab  Result Value Ref Range Status   SARS Coronavirus 2 by RT PCR NEGATIVE NEGATIVE Final    Comment: (NOTE) SARS-CoV-2 target nucleic acids are NOT DETECTED. The SARS-CoV-2 RNA is generally detectable in upper respiratoy specimens during the acute phase of infection. The lowest concentration of SARS-CoV-2 viral copies this assay can detect is 131 copies/mL. A negative result does not preclude SARS-Cov-2 infection and should not be used as the sole basis for treatment or other patient management decisions. A negative result may occur with  improper specimen collection/handling, submission of specimen other than nasopharyngeal swab, presence of viral mutation(s) within the areas targeted by this assay, and inadequate number of viral copies (<131 copies/mL). A negative result must be combined with clinical observations, patient history, and epidemiological information. The expected result is Negative. Fact Sheet for Patients:  PinkCheek.be Fact Sheet for Healthcare Providers:  GravelBags.it This test  is not yet ap proved or cleared by the [place] and  has been authorized for detection and/or diagnosis of SARS-CoV-2 by FDA under an Emergency Use Authorization (EUA). This EUA will remain  in effect (meaning this test can be used) for the duration of the COVID-19 declaration under Section 564(b)(1) of the Act, 21 U.S.C. section 360bbb-3(b)(1), unless the authorization is terminated or revoked sooner.    Influenza A by PCR NEGATIVE NEGATIVE Final   Influenza B by PCR NEGATIVE NEGATIVE Final    Comment: (NOTE) The Xpert Xpress SARS-CoV-2/FLU/RSV assay is intended as an aid in  the diagnosis of influenza from  Nasopharyngeal swab specimens and  should not be used as a sole basis for treatment. Nasal washings and  aspirates are unacceptable for Xpert Xpress SARS-CoV-2/FLU/RSV  testing. Fact Sheet for Patients: PinkCheek.be Fact Sheet for Healthcare Providers: GravelBags.it This test is not yet approved or cleared by the [place] FDA and  has been authorized for detection and/or diagnosis of SARS-CoV-2 by  FDA under an Emergency Use Authorization (EUA). This EUA will remain  in effect (meaning this test can be used) for the duration of the  Covid-19 declaration under Section 564(b)(1) of the Act, 21  U.S.C. section 360bbb-3(b)(1), unless the authorization is  terminated or revoked. Performed at [facility], [place] [address], [place], [place] [address]   MRSA PCR Screening     Status: Abnormal   Collection Time: 03/25/20  9:13 AM   Specimen: Nasal Mucosa; Nasopharyngeal  Result Value Ref Range Status   MRSA by PCR POSITIVE (A) NEGATIVE Final    Comment:        The GeneXpert MRSA Assay (FDA approved for NASAL specimens only), is one component of a comprehensive MRSA colonization surveillance program. It is not intended to diagnose MRSA infection nor to guide or monitor treatment for MRSA infections. RESULT CALLED TO, READ BACK BY AND VERIFIED WITH: Laurette Schimke RN 11:10 03/25/20 ([email or web address]) Performed at [facility], [place] [address], [place], [place] [address]      Labs: Basic Metabolic Panel: Recent Labs  Lab 03/24/20 1704 03/24/20 1712  NA 155* 155*  K 4.7 4.5  CL 121*  --   CO2 21*  --   GLUCOSE 113*  --   BUN 36*  --   CREATININE 1.19  --   CALCIUM 8.8*  --    Liver Function Tests: Recent Labs  Lab 03/24/20 1704  AST 28  ALT 16  ALKPHOS 68  BILITOT 1.0  PROT 6.5  ALBUMIN 2.0*   No results for input(s): LIPASE, AMYLASE in the last 168 hours. No results for input(s): AMMONIA in the last 168  hours. CBC: Recent Labs  Lab 03/24/20 1704 03/24/20 1712  WBC 18.1*  --   NEUTROABS 16.8*  --   HGB 10.5* 9.9*  HCT 34.5* 29.0*  MCV 98.6  --   PLT 303  --    Cardiac Enzymes: No results for input(s): CKTOTAL, CKMB, CKMBINDEX, TROPONINI in the last 168 hours. BNP: BNP (last 3 results) No results for input(s): BNP in the last 8760 hours.  ProBNP (last 3 results) No results for input(s): PROBNP in the last 8760 hours.  CBG: No results for input(s): GLUCAP in the last 168 hours.     Signed:  Dia Crawford, MD [facility] [phone] pager

## 2020-03-29 LAB — CULTURE, BLOOD (ROUTINE X 2)
Culture: NO GROWTH
Culture: NO GROWTH
Special Requests: ADEQUATE
Special Requests: ADEQUATE

## 2020-04-10 DEATH — deceased
